# Patient Record
Sex: Female | Born: 1945
Health system: Southern US, Community
[De-identification: ages and names within clinical notes are randomized; demographics above are authoritative.]

## PROBLEM LIST (undated history)

## (undated) DIAGNOSIS — F329 Major depressive disorder, single episode, unspecified: Secondary | ICD-10-CM

## (undated) DIAGNOSIS — E039 Hypothyroidism, unspecified: Secondary | ICD-10-CM

## (undated) DIAGNOSIS — K219 Gastro-esophageal reflux disease without esophagitis: Secondary | ICD-10-CM

## (undated) DIAGNOSIS — I10 Essential (primary) hypertension: Secondary | ICD-10-CM

## (undated) DIAGNOSIS — E785 Hyperlipidemia, unspecified: Secondary | ICD-10-CM

## (undated) DIAGNOSIS — R195 Other fecal abnormalities: Secondary | ICD-10-CM

## (undated) DIAGNOSIS — F32A Depression, unspecified: Secondary | ICD-10-CM

## (undated) DIAGNOSIS — Z1231 Encounter for screening mammogram for malignant neoplasm of breast: Secondary | ICD-10-CM

## (undated) HISTORY — PX: FOOT SURGERY: SHX648

## (undated) HISTORY — DX: Other fecal abnormalities: R19.5

## (undated) HISTORY — DX: Gastro-esophageal reflux disease without esophagitis: K21.9

## (undated) HISTORY — DX: Major depressive disorder, single episode, unspecified: F32.9

## (undated) HISTORY — DX: Depression, unspecified: F32.A

## (undated) HISTORY — PX: TOTAL KNEE ARTHROPLASTY: SHX125

## (undated) HISTORY — DX: Hyperlipidemia, unspecified: E78.5

## (undated) HISTORY — DX: Hypothyroidism, unspecified: E03.9

## (undated) HISTORY — PX: ABDOMINAL HYSTERECTOMY: SHX81

## (undated) HISTORY — DX: Essential (primary) hypertension: I10

## (undated) HISTORY — DX: Encounter for screening mammogram for malignant neoplasm of breast: Z12.31

---

## 1997-08-17 ENCOUNTER — Ambulatory Visit (HOSPITAL_COMMUNITY): Admission: RE | Admit: 1997-08-17 | Discharge: 1997-08-17 | Payer: Self-pay | Admitting: Family Medicine

## 1997-10-28 ENCOUNTER — Emergency Department (HOSPITAL_COMMUNITY): Admission: EM | Admit: 1997-10-28 | Discharge: 1997-10-28 | Payer: Self-pay | Admitting: Emergency Medicine

## 1998-05-24 ENCOUNTER — Ambulatory Visit (HOSPITAL_COMMUNITY): Admission: RE | Admit: 1998-05-24 | Discharge: 1998-05-24 | Payer: Self-pay | Admitting: Family Medicine

## 1999-01-04 ENCOUNTER — Ambulatory Visit (HOSPITAL_COMMUNITY): Admission: RE | Admit: 1999-01-04 | Discharge: 1999-01-04 | Payer: Self-pay | Admitting: Oncology

## 1999-01-04 ENCOUNTER — Encounter: Payer: Self-pay | Admitting: Family Medicine

## 1999-01-22 ENCOUNTER — Other Ambulatory Visit: Admission: RE | Admit: 1999-01-22 | Discharge: 1999-01-22 | Payer: Self-pay | Admitting: Family Medicine

## 2000-01-10 ENCOUNTER — Encounter: Payer: Self-pay | Admitting: Family Medicine

## 2000-01-10 ENCOUNTER — Ambulatory Visit (HOSPITAL_COMMUNITY): Admission: RE | Admit: 2000-01-10 | Discharge: 2000-01-10 | Payer: Self-pay | Admitting: Family Medicine

## 2000-01-22 ENCOUNTER — Other Ambulatory Visit: Admission: RE | Admit: 2000-01-22 | Discharge: 2000-01-22 | Payer: Self-pay | Admitting: Family Medicine

## 2000-05-05 ENCOUNTER — Encounter: Payer: Self-pay | Admitting: Family Medicine

## 2000-05-05 ENCOUNTER — Encounter: Admission: RE | Admit: 2000-05-05 | Discharge: 2000-05-05 | Payer: Self-pay | Admitting: Family Medicine

## 2001-01-14 ENCOUNTER — Encounter: Payer: Self-pay | Admitting: Family Medicine

## 2001-01-14 ENCOUNTER — Ambulatory Visit (HOSPITAL_COMMUNITY): Admission: RE | Admit: 2001-01-14 | Discharge: 2001-01-14 | Payer: Self-pay | Admitting: Family Medicine

## 2001-01-22 ENCOUNTER — Other Ambulatory Visit: Admission: RE | Admit: 2001-01-22 | Discharge: 2001-01-22 | Payer: Self-pay | Admitting: Family Medicine

## 2002-01-18 ENCOUNTER — Encounter: Payer: Self-pay | Admitting: Family Medicine

## 2002-01-18 ENCOUNTER — Ambulatory Visit (HOSPITAL_COMMUNITY): Admission: RE | Admit: 2002-01-18 | Discharge: 2002-01-18 | Payer: Self-pay | Admitting: Family Medicine

## 2002-01-28 ENCOUNTER — Other Ambulatory Visit: Admission: RE | Admit: 2002-01-28 | Discharge: 2002-01-28 | Payer: Self-pay | Admitting: Family Medicine

## 2002-12-03 ENCOUNTER — Encounter: Admission: RE | Admit: 2002-12-03 | Discharge: 2002-12-03 | Payer: Self-pay | Admitting: Family Medicine

## 2002-12-03 ENCOUNTER — Encounter: Payer: Self-pay | Admitting: Family Medicine

## 2002-12-29 ENCOUNTER — Ambulatory Visit (HOSPITAL_BASED_OUTPATIENT_CLINIC_OR_DEPARTMENT_OTHER): Admission: RE | Admit: 2002-12-29 | Discharge: 2002-12-29 | Payer: Self-pay | Admitting: Orthopedic Surgery

## 2002-12-29 ENCOUNTER — Ambulatory Visit (HOSPITAL_COMMUNITY): Admission: RE | Admit: 2002-12-29 | Discharge: 2002-12-29 | Payer: Self-pay | Admitting: Orthopedic Surgery

## 2003-01-20 ENCOUNTER — Ambulatory Visit (HOSPITAL_COMMUNITY): Admission: RE | Admit: 2003-01-20 | Discharge: 2003-01-20 | Payer: Self-pay | Admitting: Family Medicine

## 2003-01-20 ENCOUNTER — Encounter: Payer: Self-pay | Admitting: Family Medicine

## 2003-02-03 ENCOUNTER — Other Ambulatory Visit: Admission: RE | Admit: 2003-02-03 | Discharge: 2003-02-03 | Payer: Self-pay | Admitting: Family Medicine

## 2004-01-23 ENCOUNTER — Ambulatory Visit (HOSPITAL_COMMUNITY): Admission: RE | Admit: 2004-01-23 | Discharge: 2004-01-23 | Payer: Self-pay | Admitting: Family Medicine

## 2005-02-14 ENCOUNTER — Ambulatory Visit (HOSPITAL_COMMUNITY): Admission: RE | Admit: 2005-02-14 | Discharge: 2005-02-14 | Payer: Self-pay | Admitting: Family Medicine

## 2005-02-21 ENCOUNTER — Encounter: Admission: RE | Admit: 2005-02-21 | Discharge: 2005-02-21 | Payer: Self-pay | Admitting: Family Medicine

## 2005-02-27 ENCOUNTER — Ambulatory Visit: Payer: Self-pay | Admitting: Internal Medicine

## 2005-03-05 ENCOUNTER — Encounter (INDEPENDENT_AMBULATORY_CARE_PROVIDER_SITE_OTHER): Payer: Self-pay | Admitting: Specialist

## 2005-03-05 ENCOUNTER — Ambulatory Visit: Payer: Self-pay | Admitting: Internal Medicine

## 2006-02-18 ENCOUNTER — Ambulatory Visit (HOSPITAL_COMMUNITY): Admission: RE | Admit: 2006-02-18 | Discharge: 2006-02-18 | Payer: Self-pay | Admitting: Family Medicine

## 2006-04-24 ENCOUNTER — Ambulatory Visit (HOSPITAL_COMMUNITY): Admission: RE | Admit: 2006-04-24 | Discharge: 2006-04-24 | Payer: Self-pay | Admitting: Family Medicine

## 2006-07-21 ENCOUNTER — Encounter: Admission: RE | Admit: 2006-07-21 | Discharge: 2006-07-21 | Payer: Self-pay | Admitting: Family Medicine

## 2007-02-24 ENCOUNTER — Ambulatory Visit (HOSPITAL_COMMUNITY): Admission: RE | Admit: 2007-02-24 | Discharge: 2007-02-24 | Payer: Self-pay | Admitting: Family Medicine

## 2007-04-09 HISTORY — PX: COLONOSCOPY: SHX174

## 2007-04-19 ENCOUNTER — Emergency Department (HOSPITAL_COMMUNITY): Admission: EM | Admit: 2007-04-19 | Discharge: 2007-04-19 | Payer: Self-pay | Admitting: Emergency Medicine

## 2008-02-25 ENCOUNTER — Ambulatory Visit (HOSPITAL_COMMUNITY): Admission: RE | Admit: 2008-02-25 | Discharge: 2008-02-25 | Payer: Self-pay | Admitting: Family Medicine

## 2008-08-16 DIAGNOSIS — F959 Tic disorder, unspecified: Secondary | ICD-10-CM | POA: Insufficient documentation

## 2008-10-20 DIAGNOSIS — K2100 Gastro-esophageal reflux disease with esophagitis, without bleeding: Secondary | ICD-10-CM | POA: Insufficient documentation

## 2009-02-28 ENCOUNTER — Ambulatory Visit (HOSPITAL_COMMUNITY): Admission: RE | Admit: 2009-02-28 | Discharge: 2009-02-28 | Payer: Self-pay | Admitting: Family Medicine

## 2009-04-10 DIAGNOSIS — N952 Postmenopausal atrophic vaginitis: Secondary | ICD-10-CM | POA: Insufficient documentation

## 2009-04-11 ENCOUNTER — Encounter: Admission: RE | Admit: 2009-04-11 | Discharge: 2009-04-11 | Payer: Self-pay | Admitting: Family Medicine

## 2009-05-25 DIAGNOSIS — J309 Allergic rhinitis, unspecified: Secondary | ICD-10-CM | POA: Insufficient documentation

## 2009-05-25 DIAGNOSIS — E538 Deficiency of other specified B group vitamins: Secondary | ICD-10-CM | POA: Insufficient documentation

## 2009-08-07 ENCOUNTER — Inpatient Hospital Stay (HOSPITAL_COMMUNITY): Admission: RE | Admit: 2009-08-07 | Discharge: 2009-08-11 | Payer: Self-pay | Admitting: Orthopedic Surgery

## 2010-01-22 DIAGNOSIS — M159 Polyosteoarthritis, unspecified: Secondary | ICD-10-CM | POA: Insufficient documentation

## 2010-03-05 ENCOUNTER — Encounter: Admission: RE | Admit: 2010-03-05 | Discharge: 2010-03-05 | Payer: Self-pay | Admitting: Family Medicine

## 2010-03-06 ENCOUNTER — Ambulatory Visit: Payer: Self-pay | Admitting: Family Medicine

## 2010-03-12 ENCOUNTER — Ambulatory Visit: Payer: Self-pay | Admitting: Family Medicine

## 2010-03-31 ENCOUNTER — Emergency Department (HOSPITAL_COMMUNITY)
Admission: EM | Admit: 2010-03-31 | Discharge: 2010-03-31 | Payer: Self-pay | Source: Home / Self Care | Admitting: Emergency Medicine

## 2010-04-06 ENCOUNTER — Ambulatory Visit
Admission: RE | Admit: 2010-04-06 | Discharge: 2010-04-06 | Payer: Self-pay | Source: Home / Self Care | Attending: Family Medicine | Admitting: Family Medicine

## 2010-04-06 ENCOUNTER — Ambulatory Visit: Admit: 2010-04-06 | Payer: Self-pay | Admitting: Family Medicine

## 2010-04-28 ENCOUNTER — Encounter: Payer: Self-pay | Admitting: Family Medicine

## 2010-05-15 ENCOUNTER — Ambulatory Visit (INDEPENDENT_AMBULATORY_CARE_PROVIDER_SITE_OTHER): Payer: 59 | Admitting: Family Medicine

## 2010-05-15 DIAGNOSIS — E039 Hypothyroidism, unspecified: Secondary | ICD-10-CM

## 2010-05-15 DIAGNOSIS — R1032 Left lower quadrant pain: Secondary | ICD-10-CM

## 2010-05-15 DIAGNOSIS — E785 Hyperlipidemia, unspecified: Secondary | ICD-10-CM

## 2010-05-15 DIAGNOSIS — Z79899 Other long term (current) drug therapy: Secondary | ICD-10-CM

## 2010-05-24 ENCOUNTER — Ambulatory Visit (INDEPENDENT_AMBULATORY_CARE_PROVIDER_SITE_OTHER): Payer: 59 | Admitting: Family Medicine

## 2010-05-24 DIAGNOSIS — Z79899 Other long term (current) drug therapy: Secondary | ICD-10-CM

## 2010-05-24 DIAGNOSIS — E785 Hyperlipidemia, unspecified: Secondary | ICD-10-CM

## 2010-06-22 ENCOUNTER — Emergency Department (HOSPITAL_COMMUNITY)
Admission: EM | Admit: 2010-06-22 | Discharge: 2010-06-22 | Disposition: A | Discharge status: Home / Self Care | Payer: 59 | Attending: Emergency Medicine | Admitting: Emergency Medicine

## 2010-06-22 DIAGNOSIS — R112 Nausea with vomiting, unspecified: Secondary | ICD-10-CM | POA: Insufficient documentation

## 2010-06-22 DIAGNOSIS — R197 Diarrhea, unspecified: Secondary | ICD-10-CM | POA: Insufficient documentation

## 2010-06-22 DIAGNOSIS — R5381 Other malaise: Secondary | ICD-10-CM | POA: Insufficient documentation

## 2010-06-22 LAB — POCT I-STAT, CHEM 8
Calcium, Ion: 1.12 mmol/L (ref 1.12–1.32)
Chloride: 106 mEq/L (ref 96–112)
Creatinine, Ser: 0.9 mg/dL (ref 0.4–1.2)
Glucose, Bld: 156 mg/dL — ABNORMAL HIGH (ref 70–99)
HCT: 44 % (ref 36.0–46.0)
Hemoglobin: 15 g/dL (ref 12.0–15.0)
Potassium: 4.1 mEq/L (ref 3.5–5.1)
Sodium: 139 mEq/L (ref 135–145)
TCO2: 20 mmol/L (ref 0–100)

## 2010-06-26 LAB — BLOOD GAS, ARTERIAL
Acid-base deficit: 0.1 mmol/L (ref 0.0–2.0)
Acid-base deficit: 1.9 mmol/L (ref 0.0–2.0)
Bicarbonate: 24.2 mEq/L — ABNORMAL HIGH (ref 20.0–24.0)
Drawn by: 324701
O2 Content: 3 L/min
O2 Content: 3 L/min
O2 Saturation: 97.3 %
Patient temperature: 98.6
Patient temperature: 98.6
TCO2: 25.4 mmol/L (ref 0–100)
pCO2 arterial: 40.2 mmHg (ref 35.0–45.0)
pCO2 arterial: 44.4 mmHg (ref 35.0–45.0)
pH, Arterial: 7.336 — ABNORMAL LOW (ref 7.350–7.400)
pH, Arterial: 7.397 (ref 7.350–7.400)
pO2, Arterial: 91 mmHg (ref 80.0–100.0)

## 2010-06-26 LAB — CBC
HCT: 26.3 % — ABNORMAL LOW (ref 36.0–46.0)
HCT: 28.4 % — ABNORMAL LOW (ref 36.0–46.0)
HCT: 35.4 % — ABNORMAL LOW (ref 36.0–46.0)
Hemoglobin: 9.6 g/dL — ABNORMAL LOW (ref 12.0–15.0)
Hemoglobin: 9.8 g/dL — ABNORMAL LOW (ref 12.0–15.0)
MCHC: 34.6 g/dL (ref 30.0–36.0)
MCHC: 34.9 g/dL (ref 30.0–36.0)
MCHC: 35.1 g/dL (ref 30.0–36.0)
MCV: 85.1 fL (ref 78.0–100.0)
MCV: 85.6 fL (ref 78.0–100.0)
Platelets: 109 10*3/uL — ABNORMAL LOW (ref 150–400)
Platelets: 116 10*3/uL — ABNORMAL LOW (ref 150–400)
Platelets: 138 10*3/uL — ABNORMAL LOW (ref 150–400)
Platelets: 138 10*3/uL — ABNORMAL LOW (ref 150–400)
Platelets: 199 10*3/uL (ref 150–400)
RBC: 3.31 MIL/uL — ABNORMAL LOW (ref 3.87–5.11)
RDW: 12.8 % (ref 11.5–15.5)
RDW: 12.9 % (ref 11.5–15.5)
RDW: 13.7 % (ref 11.5–15.5)
RDW: 13.8 % (ref 11.5–15.5)
RDW: 12.8 % (ref 11.5–15.5)
WBC: 2.9 10*3/uL — ABNORMAL LOW (ref 4.0–10.5)
WBC: 4.6 10*3/uL (ref 4.0–10.5)
WBC: 5 10*3/uL (ref 4.0–10.5)
WBC: 5 10*3/uL (ref 4.0–10.5)

## 2010-06-26 LAB — PHOSPHORUS
Phosphorus: 2.2 mg/dL — ABNORMAL LOW (ref 2.3–4.6)
Phosphorus: 3.6 mg/dL (ref 2.3–4.6)

## 2010-06-26 LAB — BASIC METABOLIC PANEL
BUN: 11 mg/dL (ref 6–23)
BUN: 12 mg/dL (ref 6–23)
BUN: 6 mg/dL (ref 6–23)
BUN: 7 mg/dL (ref 6–23)
CO2: 25 mEq/L (ref 19–32)
CO2: 25 mEq/L (ref 19–32)
Calcium: 7.9 mg/dL — ABNORMAL LOW (ref 8.4–10.5)
Calcium: 8.1 mg/dL — ABNORMAL LOW (ref 8.4–10.5)
Chloride: 101 mEq/L (ref 96–112)
Chloride: 114 mEq/L — ABNORMAL HIGH (ref 96–112)
Creatinine, Ser: 0.61 mg/dL (ref 0.4–1.2)
Creatinine, Ser: 0.61 mg/dL (ref 0.4–1.2)
Creatinine, Ser: 0.66 mg/dL (ref 0.4–1.2)
Creatinine, Ser: 0.71 mg/dL (ref 0.4–1.2)
GFR calc Af Amer: >60 mL/min (ref >60)
GFR calc non Af Amer: >60 mL/min (ref >60)
GFR calc non Af Amer: >60 mL/min (ref >60)
GFR calc non Af Amer: >60 mL/min (ref >60)
Glucose, Bld: 115 mg/dL — ABNORMAL HIGH (ref 70–99)
Glucose, Bld: 119 mg/dL — ABNORMAL HIGH (ref 70–99)
Glucose, Bld: 122 mg/dL — ABNORMAL HIGH (ref 70–99)
Glucose, Bld: 131 mg/dL — ABNORMAL HIGH (ref 70–99)
Potassium: 3.5 mEq/L (ref 3.5–5.1)
Sodium: 133 mEq/L — ABNORMAL LOW (ref 135–145)

## 2010-06-26 LAB — CULTURE, BLOOD (ROUTINE X 2)
Culture: NO GROWTH
Culture: NO GROWTH

## 2010-06-26 LAB — URINALYSIS, ROUTINE W REFLEX MICROSCOPIC
Glucose, UA: NEGATIVE mg/dL
Nitrite: NEGATIVE
Specific Gravity, Urine: 1.007 (ref 1.005–1.030)
pH: 6 (ref 5.0–8.0)

## 2010-06-26 LAB — CULTURE, RESPIRATORY W GRAM STAIN: Culture: NORMAL

## 2010-06-26 LAB — CROSSMATCH: Antibody Screen: NEGATIVE

## 2010-06-26 LAB — DIFFERENTIAL
Basophils Absolute: 0 10*3/uL (ref 0.0–0.1)
Basophils Relative: 0 % (ref 0–1)
Eosinophils Absolute: 0.1 10*3/uL (ref 0.0–0.7)
Eosinophils Relative: 5 % (ref 0–5)
Eosinophils Relative: 3 % (ref 0–5)
Lymphocytes Relative: 27 % (ref 12–46)
Lymphs Abs: 0.3 10*3/uL — ABNORMAL LOW (ref 0.7–4.0)
Monocytes Absolute: 0.5 10*3/uL (ref 0.1–1.0)
Monocytes Relative: 9 % (ref 3–12)
Neutro Abs: 3.1 10*3/uL (ref 1.7–7.7)
Neutrophils Relative %: 75 % (ref 43–77)

## 2010-06-26 LAB — EXPECTORATED SPUTUM ASSESSMENT W GRAM STAIN, RFLX TO RESP C

## 2010-06-26 LAB — COMPREHENSIVE METABOLIC PANEL
AST: 18 U/L (ref 0–37)
Albumin: 4.3 g/dL (ref 3.5–5.2)
BUN: 15 mg/dL (ref 6–23)
Chloride: 104 mEq/L (ref 96–112)
Creatinine, Ser: 0.59 mg/dL (ref 0.4–1.2)
GFR calc Af Amer: >60 mL/min (ref >60)
Potassium: 3.8 mEq/L (ref 3.5–5.1)
Total Protein: 6.6 g/dL (ref 6.0–8.3)

## 2010-06-26 LAB — URINE CULTURE: Colony Count: NO GROWTH

## 2010-06-26 LAB — MAGNESIUM: Magnesium: 2.5 mg/dL (ref 1.5–2.5)

## 2010-06-26 LAB — MRSA PCR SCREENING: MRSA by PCR: NEGATIVE

## 2010-06-26 LAB — D-DIMER, QUANTITATIVE: D-Dimer, Quant: 3.01 ug/mL-FEU — ABNORMAL HIGH (ref 0.00–0.48)

## 2010-06-26 LAB — BRAIN NATRIURETIC PEPTIDE: Pro B Natriuretic peptide (BNP): 136 pg/mL — ABNORMAL HIGH (ref 0.0–100.0)

## 2010-06-26 LAB — APTT: aPTT: 29 seconds (ref 24–37)

## 2010-07-25 ENCOUNTER — Encounter: Payer: Self-pay | Admitting: Family Medicine

## 2010-08-22 ENCOUNTER — Encounter: Payer: Self-pay | Admitting: Family Medicine

## 2010-08-22 ENCOUNTER — Ambulatory Visit (INDEPENDENT_AMBULATORY_CARE_PROVIDER_SITE_OTHER): Payer: 59 | Admitting: Family Medicine

## 2010-08-22 VITALS — BP 116/76 | HR 64 | Wt 120.0 lb

## 2010-08-22 DIAGNOSIS — J3089 Other allergic rhinitis: Secondary | ICD-10-CM

## 2010-08-22 DIAGNOSIS — E785 Hyperlipidemia, unspecified: Secondary | ICD-10-CM

## 2010-08-22 LAB — LIPID PANEL
Cholesterol: 277 mg/dL — ABNORMAL HIGH (ref 0–200)
VLDL: 52 mg/dL — ABNORMAL HIGH (ref 0–40)

## 2010-08-22 MED ORDER — MONTELUKAST SODIUM 10 MG PO TABS: 10.0000 mg | ORAL_TABLET | Freq: Every day | ORAL | Status: DC

## 2010-08-22 MED ORDER — FLUTICASONE PROPIONATE 50 MCG/ACT NA SUSP: 2.0000 | Freq: Every day | NASAL | Status: DC

## 2010-08-22 NOTE — Progress Notes (Signed)
  Subjective:    Patient ID: Bonnie Sanders, female    DOB: [birth year], 65 y.o.   MRN: [id number]  HPI she is here for a recheck on her lipids. She has been using omega-3 and having no difficulty with this. Also has underlying allergies and needs her Singulair and Flonase renewed. These medicines seem to be working quite nicely for her.   Review of Systems    negative except as above Objective:   Physical Exam alert and in no distress otherwise not examined        Assessment & Plan:  Is lipidemia. Allergic rhinitis. Lipid panel will be drawn. I will also renew her Flonase and Singulair. Return here as needed.

## 2010-08-23 ENCOUNTER — Telehealth: Payer: Self-pay | Admitting: Family Medicine

## 2010-08-24 ENCOUNTER — Telehealth: Payer: Self-pay

## 2010-08-24 NOTE — Progress Notes (Signed)
Waiting for pt call me back

## 2010-08-24 NOTE — Telephone Encounter (Signed)
Pt has been informed of labs and will come by Monday to pick up card

## 2010-08-24 NOTE — Op Note (Signed)
NAMEKORRINA, ZERN                         ACCOUNT NO.:  [id number]   MEDICAL RECORD NO.:  [id number]                   PATIENT TYPE:  AMB   LOCATION:  DSC                                  FACILITY:  [facility]   PHYSICIAN:  Mila Homer. Sherlean Foot, M.D.              DATE OF BIRTH:  11/30/1945   DATE OF PROCEDURE:  12/29/2002  DATE OF DISCHARGE:                                 OPERATIVE REPORT   PREOPERATIVE DIAGNOSIS:  Right knee lateral meniscal tear, possible medial  meniscal tear and osteoarthritis.   POSTOPERATIVE DIAGNOSIS:  Right knee medial meniscal tear, lateral meniscal  tear, osteoarthritis and loose body.   SURGEON:  Mila Homer. Sherlean Foot, M.D.   ASSISTANT:  None.   ANESTHESIA:  General.   INDICATIONS FOR PROCEDURE:  The patient is a 65 year old white female with  MRI evidence of anterior horn lateral meniscal tear, questionable  degenerative changes in the medial meniscus and mechanical symptoms.  Informed consent was obtained.   DESCRIPTION OF PROCEDURE:  The patient was taken to the operating room,  administered general anesthesia in the supine position on the operating room  table.  The right lower extremity was prepped and draped in the usual  sterile fashion.  Inferolateral and inferomedial portals were created with a  #11 blade, blunt trocar and cannula.  Diagnostic arthroscopy revealed some  mild degree of patellofemoral osteoarthritis.  However, the lateral  compartment had some areas of grade 4 changes mainly near the notch.  The  ACL was completely gone and torn and the anterior portion was impinging  somewhat in the lateral compartment. There was also a loose body in the  notch behind the torn ACL sitting on top of the posterior horn of the  lateral meniscus, measuring 1 x 1 cm when we pulled it out.  I performed a  chondroplasty on the lateral compartment.  I then debrided the lateral  meniscus with basket forceps and a great white shaver.  I then used the  pituitary rongeur to remove the loose body and the notch.  I debrided the  ACL back to its origins.  I then went into the medial compartment. There was  a medial meniscal tear which was complex with several radial type tears in  the posterior horn.  I performed an aggressive posterior horn medial  meniscectomy with basket forceps and shaver.  There was an area 1 x 1 cm on  the posterior tibial plateau with grade 4 chondromalacia.  I debrided this  area as well.  I then went into extension, checked all around the gutters,  found no more loose bodies.  I then evacuated the joint of instruments and  fluid.  I then closed with interrupted 4-0 nylon sutures, dressed with  Xeroform, dressing sponges, sterile Webril and Ace wrap.  I did infiltrate  10mL of a Marcaine morphine mixture in both portals.   TOURNIQUET TIME:  None.    COMPLICATIONS:  None.   DRAINS:  None.                                               Mila Homer. Sherlean Foot, M.D.    SDL/MEDQ  D:  12/29/2002  T:  12/29/2002  Job:  [id number]

## 2010-08-24 NOTE — Telephone Encounter (Signed)
Left message for pt to call me back 

## 2010-08-29 NOTE — Telephone Encounter (Signed)
DONE-LM  

## 2010-09-12 ENCOUNTER — Telehealth: Payer: Self-pay | Admitting: Family Medicine

## 2010-09-12 MED ORDER — LEVOTHYROXINE SODIUM 112 MCG PO TABS: 112.0000 ug | ORAL_TABLET | Freq: Every day | ORAL | Status: DC

## 2010-09-12 NOTE — Telephone Encounter (Signed)
Ordered med.

## 2010-09-17 ENCOUNTER — Other Ambulatory Visit: Payer: Self-pay

## 2010-09-17 MED ORDER — LEVOTHYROXINE SODIUM 100 MCG PO TABS: 100.0000 ug | ORAL_TABLET | Freq: Every day | ORAL | Status: DC

## 2010-10-25 ENCOUNTER — Encounter: Payer: Self-pay | Admitting: Family Medicine

## 2010-10-25 ENCOUNTER — Ambulatory Visit (INDEPENDENT_AMBULATORY_CARE_PROVIDER_SITE_OTHER): Payer: 59 | Admitting: Family Medicine

## 2010-10-25 VITALS — BP 110/70 | HR 84 | Wt 121.0 lb

## 2010-10-25 DIAGNOSIS — E785 Hyperlipidemia, unspecified: Secondary | ICD-10-CM | POA: Insufficient documentation

## 2010-10-25 DIAGNOSIS — R201 Hypoesthesia of skin: Secondary | ICD-10-CM

## 2010-10-25 DIAGNOSIS — Z79899 Other long term (current) drug therapy: Secondary | ICD-10-CM

## 2010-10-25 DIAGNOSIS — E039 Hypothyroidism, unspecified: Secondary | ICD-10-CM | POA: Insufficient documentation

## 2010-10-25 DIAGNOSIS — R209 Unspecified disturbances of skin sensation: Secondary | ICD-10-CM

## 2010-10-25 LAB — HEPATIC FUNCTION PANEL
ALT: 17 U/L (ref 0–35)
Albumin: 4.5 g/dL (ref 3.5–5.2)
Total Protein: 6.9 g/dL (ref 6.0–8.3)

## 2010-10-25 LAB — LIPID PANEL
HDL: 39 mg/dL — ABNORMAL LOW (ref >39)
Triglycerides: 282 mg/dL — ABNORMAL HIGH (ref <150)

## 2010-10-25 LAB — TSH: TSH: 0.051 u[IU]/mL — ABNORMAL LOW (ref 0.350–4.500)

## 2010-10-25 NOTE — Patient Instructions (Signed)
Monitor the difficulty with your hands and if this gets worse, I will refer to neurology.

## 2010-10-25 NOTE — Progress Notes (Signed)
  Subjective:    Patient ID: Bonnie Sanders, female    DOB: 09/24/1945, 65 y.o.   MRN: [id number]  HPI She is here for recheck on her lipids. She has been taking CoQ10 and continued on Lipitor and her aches and pains She has also had intermittent difficulty with numbness in her fingertips. This has sometimes interfered with her ability to do fine motor activity. She also has a long history of tremor that she can date back to her childhood. This is especially bothersome when she is stressed. She also is presently on 0.1 of her Synthroid.   Review of Systems     Objective:   Physical Exam Alert and in no distress. Involuntary movements are noted. Good hand strength. Pulses normal. Sensation normal.       Assessment & Plan:  Hyperlipidemia. Hypothyroid. hypesthesias of the hands.akesthesias Liver, lipid and TSH.

## 2010-10-29 ENCOUNTER — Telehealth: Payer: Self-pay

## 2010-10-29 ENCOUNTER — Other Ambulatory Visit: Payer: Self-pay

## 2010-10-29 MED ORDER — ATORVASTATIN CALCIUM 20 MG PO TABS: 20.0000 mg | ORAL_TABLET | Freq: Every day | ORAL | Status: DC

## 2010-10-29 NOTE — Telephone Encounter (Signed)
Pt med done

## 2010-10-29 NOTE — Telephone Encounter (Signed)
Called pt to let her know about labs and to repeat in one month

## 2010-10-29 NOTE — Telephone Encounter (Signed)
Called pt home # left messaqge

## 2010-11-26 ENCOUNTER — Ambulatory Visit: Payer: 59 | Admitting: Family Medicine

## 2010-11-30 ENCOUNTER — Encounter: Payer: 59 | Admitting: Family Medicine

## 2010-11-30 ENCOUNTER — Ambulatory Visit (INDEPENDENT_AMBULATORY_CARE_PROVIDER_SITE_OTHER): Payer: 59 | Admitting: Family Medicine

## 2010-11-30 ENCOUNTER — Encounter: Payer: Self-pay | Admitting: Family Medicine

## 2010-11-30 VITALS — BP 120/60 | HR 80 | Ht 61.0 in | Wt 123.0 lb

## 2010-11-30 DIAGNOSIS — J301 Allergic rhinitis due to pollen: Secondary | ICD-10-CM

## 2010-11-30 DIAGNOSIS — Z23 Encounter for immunization: Secondary | ICD-10-CM

## 2010-11-30 DIAGNOSIS — E785 Hyperlipidemia, unspecified: Secondary | ICD-10-CM

## 2010-11-30 DIAGNOSIS — I10 Essential (primary) hypertension: Secondary | ICD-10-CM

## 2010-11-30 DIAGNOSIS — Z0000 Encounter for general adult medical examination without abnormal findings: Secondary | ICD-10-CM

## 2010-11-30 DIAGNOSIS — M810 Age-related osteoporosis without current pathological fracture: Secondary | ICD-10-CM

## 2010-11-30 DIAGNOSIS — Z79899 Other long term (current) drug therapy: Secondary | ICD-10-CM

## 2010-11-30 DIAGNOSIS — N941 Unspecified dyspareunia: Secondary | ICD-10-CM

## 2010-11-30 DIAGNOSIS — E039 Hypothyroidism, unspecified: Secondary | ICD-10-CM

## 2010-11-30 DIAGNOSIS — K219 Gastro-esophageal reflux disease without esophagitis: Secondary | ICD-10-CM

## 2010-11-30 DIAGNOSIS — IMO0002 Reserved for concepts with insufficient information to code with codable children: Secondary | ICD-10-CM

## 2010-11-30 HISTORY — DX: Allergic rhinitis due to pollen: J30.1

## 2010-11-30 LAB — TSH: TSH: 0.045 u[IU]/mL — ABNORMAL LOW (ref 0.350–4.500)

## 2010-11-30 LAB — COMPREHENSIVE METABOLIC PANEL
ALT: 21 U/L (ref 0–35)
AST: 20 U/L (ref 0–37)
Albumin: 4.8 g/dL (ref 3.5–5.2)
Alkaline Phosphatase: 62 U/L (ref 39–117)
BUN: 16 mg/dL (ref 6–23)
CO2: 27 mEq/L (ref 19–32)
Calcium: 10 mg/dL (ref 8.4–10.5)
Chloride: 101 mEq/L (ref 96–112)
Creat: 0.74 mg/dL (ref 0.50–1.10)
Glucose, Bld: 86 mg/dL (ref 70–99)
Potassium: 4.7 mEq/L (ref 3.5–5.3)
Sodium: 140 mEq/L (ref 135–145)
Total Bilirubin: 0.5 mg/dL (ref 0.3–1.2)
Total Protein: 7.4 g/dL (ref 6.0–8.3)

## 2010-11-30 LAB — CBC WITH DIFFERENTIAL/PLATELET
Basophils Absolute: 0 10*3/uL (ref 0.0–0.1)
Basophils Relative: 1 % (ref 0–1)
Eosinophils Absolute: 0.1 10*3/uL (ref 0.0–0.7)
Eosinophils Relative: 2 % (ref 0–5)
HCT: 37.8 % (ref 36.0–46.0)
Hemoglobin: 12.5 g/dL (ref 12.0–15.0)
Lymphocytes Relative: 26 % (ref 12–46)
Lymphs Abs: 1.5 10*3/uL (ref 0.7–4.0)
MCH: 30 pg (ref 26.0–34.0)
MCHC: 33.1 g/dL (ref 30.0–36.0)
MCV: 90.6 fL (ref 78.0–100.0)
Monocytes Absolute: 0.3 10*3/uL (ref 0.1–1.0)
Monocytes Relative: 6 % (ref 3–12)
Neutro Abs: 3.7 10*3/uL (ref 1.7–7.7)
Neutrophils Relative %: 66 % (ref 43–77)
Platelets: 182 10*3/uL (ref 150–400)
RBC: 4.17 MIL/uL (ref 3.87–5.11)
RDW: 13 % (ref 11.5–15.5)
WBC: 5.7 10*3/uL (ref 4.0–10.5)

## 2010-11-30 LAB — LIPID PANEL
LDL Cholesterol: 96 mg/dL (ref 0–99)
Total CHOL/HDL Ratio: 5 Ratio
VLDL: 63 mg/dL — ABNORMAL HIGH (ref 0–40)

## 2010-11-30 LAB — POCT URINALYSIS DIPSTICK
Bilirubin, UA: NEGATIVE
Blood, UA: NEGATIVE
Glucose, UA: NEGATIVE
Nitrite, UA: NEGATIVE
Spec Grav, UA: 1.015

## 2010-11-30 NOTE — Progress Notes (Signed)
  Subjective:    Patient ID: Bonnie Sanders, female    DOB: 11/23/1945, 65 y.o.   MRN: [id number]  HPI She is here for a complete examination. She does have a right cataract. She does have allergies and uses Claritin for this. She has a history of osteoporosis and continues on her Fosamax. She was placed on amitriptyline several years ago and had been seen by Dr. Jodi Marble. She has never tried to come off this medication. She has had difficulty recently with pain with sexual intercourse. Her reflux symptoms are under good control. She continues on her blood pressure medications. She also continues on thyroid medicine.  Review of Systems Negative except as above    Objective:   Physical Exam BP 120/60  Pulse 80  Ht 5\' 1"  (1.549 m)  Wt 123 lb (55.792 kg)  BMI 23.24 kg/m2  General Appearance:    Alert, cooperative, no distress, appears stated age  Head:    Normocephalic, without obvious abnormality, atraumatic  Eyes:    PERRL, conjunctiva/corneas clear, EOM's intact, fundi    benign  Ears:    Normal TM's and external ear canals  Nose:   Nares normal, mucosa normal, no drainage or sinus   tenderness  Throat:   Lips, mucosa, and tongue normal; teeth and gums normal  Neck:   Supple, no lymphadenopathy;  thyroid:  no   enlargement/tenderness/nodules; no carotid   bruit or JVD  Back:    Spine nontender, no curvature, ROM normal, no CVA     tenderness  Lungs:     Clear to auscultation bilaterally without wheezes, rales or     ronchi; respirations unlabored  Chest Wall:    No tenderness or deformity   Heart:    Regular rate and rhythm, S1 and S2 normal, no murmur, rub   or gallop  Breast Exam:    Deferred to GYN  Abdomen:     Soft, non-tender, nondistended, normoactive bowel sounds,    no masses, no hepatosplenomegaly  Genitalia:    Deferred to GYN     Extremities:   No clubbing, cyanosis or edema  Pulses:   2+ and symmetric all extremities  Skin:   Skin color, texture, turgor normal, no  rashes or lesions  Lymph nodes:   Cervical, supraclavicular, and axillary nodes normal  Neurologic:   CNII-XII intact, normal strength, sensation and gait; reflexes 2+ and symmetric throughout          Psych:   Normal mood, affect, hygiene and grooming.           Assessment & Plan:  Allergic rhinitis. Hypothyroid. Depression. Hypertension. GERD. Osteoporosis. Hyperlipidemia. Dyspareunia. I will refer to GYN for further evaluation of her dyspareunia. Discussed stopping the amitriptyline and she will consider this. She will continue on her other medications. Routine blood screening. DEXA scan. Flu shot.

## 2010-11-30 NOTE — Patient Instructions (Signed)
Continue on your present medications but consider slowly tapering off of the amitriptyline.

## 2010-12-03 ENCOUNTER — Telehealth: Payer: Self-pay

## 2010-12-03 DIAGNOSIS — Z23 Encounter for immunization: Secondary | ICD-10-CM

## 2010-12-03 NOTE — Telephone Encounter (Signed)
Pt informed of labs and to recheck in 2 to 3 mth

## 2010-12-11 ENCOUNTER — Telehealth: Payer: Self-pay

## 2010-12-11 NOTE — Telephone Encounter (Signed)
Talked to pt husband about dexa results

## 2010-12-14 ENCOUNTER — Other Ambulatory Visit: Payer: Self-pay

## 2011-01-02 ENCOUNTER — Other Ambulatory Visit: Payer: Self-pay | Admitting: Family Medicine

## 2011-02-06 ENCOUNTER — Telehealth: Payer: Self-pay | Admitting: Family Medicine

## 2011-02-06 NOTE — Telephone Encounter (Signed)
I spoke with the patient that states she has enough medication. She was only trying to change her pharmacy, but she did make an appt. To see Dr. Susann Givens on 02/13/11@ 1130am. CLS

## 2011-02-06 NOTE — Telephone Encounter (Signed)
She needs an appointment to check her thyroid function again. Don't let her run out of the medicine

## 2011-02-13 ENCOUNTER — Encounter: Payer: Self-pay | Admitting: Family Medicine

## 2011-02-13 ENCOUNTER — Ambulatory Visit (INDEPENDENT_AMBULATORY_CARE_PROVIDER_SITE_OTHER): Payer: Medicare Other | Admitting: Family Medicine

## 2011-02-13 VITALS — BP 124/78 | HR 60 | Wt 124.0 lb

## 2011-02-13 DIAGNOSIS — E785 Hyperlipidemia, unspecified: Secondary | ICD-10-CM

## 2011-02-13 DIAGNOSIS — Z79899 Other long term (current) drug therapy: Secondary | ICD-10-CM

## 2011-02-13 DIAGNOSIS — E039 Hypothyroidism, unspecified: Secondary | ICD-10-CM

## 2011-02-13 LAB — TSH: TSH: 0.168 u[IU]/mL — ABNORMAL LOW (ref 0.350–4.500)

## 2011-02-13 NOTE — Progress Notes (Signed)
  Subjective:    Patient ID: Bonnie Sanders, female    DOB: Sep 17, 1945, 65 y.o.   MRN: [id number]  HPI She is here for recheck. She continues on her present thyroid dosing. Previous TSH was quite low. She is having no skin changes, hair changes, tachycardia. She did stop taking her Lipitor stating difficulty with myalgias. She tried starting at a lower dose but still had difficulty with this.   Review of Systems     Objective:   Physical Exam Alert and in no distress otherwise not examined      Assessment & Plan:   1. Hypothyroid  TSH  2. Hyperlipidemia LDL goal < 100    3. Encounter for long-term (current) use of other medications  TSH   a sample of Crestor was given. She is to let me know how this works. TSH drawn and will readjust her thyroid based on this

## 2011-02-13 NOTE — Patient Instructions (Signed)
Try the Crestor and let me know if it causes aches and pains. If you have no difficulty, I will call this in.

## 2011-02-14 ENCOUNTER — Other Ambulatory Visit: Payer: Self-pay | Admitting: Internal Medicine

## 2011-02-14 MED ORDER — LEVOTHYROXINE SODIUM 88 MCG PO TABS: 88.0000 ug | ORAL_TABLET | Freq: Every day | ORAL | Status: DC

## 2011-03-04 ENCOUNTER — Encounter: Payer: Self-pay | Admitting: Medical

## 2011-03-04 ENCOUNTER — Ambulatory Visit (INDEPENDENT_AMBULATORY_CARE_PROVIDER_SITE_OTHER): Payer: Medicare Other | Admitting: Medical

## 2011-03-04 VITALS — BP 120/70 | HR 64 | Temp 99.2°F | Resp 16 | Wt 126.0 lb

## 2011-03-04 DIAGNOSIS — J329 Chronic sinusitis, unspecified: Secondary | ICD-10-CM | POA: Insufficient documentation

## 2011-03-04 DIAGNOSIS — J40 Bronchitis, not specified as acute or chronic: Secondary | ICD-10-CM | POA: Insufficient documentation

## 2011-03-04 MED ORDER — AZITHROMYCIN 250 MG PO TABS: ORAL_TABLET | ORAL | Status: AC

## 2011-03-04 NOTE — Progress Notes (Signed)
Subjective:   HPI  Bonnie Sanders is a 65 y.o. female who presents with 7-8 day hx/o cough, head and chest congestion, headache, productive cough, fever, and was around grandson recently who has been sick.  Using OTC decongestant.  Hasn't been sick in a while.  She does report hx/o sinus infection and notes in the past these seem to progress to chest involvement.  No other aggravating or relieving factors.    No other c/o.  The following portions of the patient's history were reviewed and updated as appropriate: allergies, current medications, past family history, past medical history, past social history, past surgical history and problem list.   Past Medical History  Diagnosis Date  . Allergic rhinitis   . Hypothyroid   . Depression   . Hypertension   . GERD (gastroesophageal reflux disease)   . Osteoporosis   . Dyslipidemia     Review of Systems Constitutional: +fever, -chills, -sweats, -unexpected -weight change,-fatigue ENT: +runny nose, -ear pain, =sore throat Cardiology:  -chest pain, -palpitations, -edema Respiratory: +cough, -shortness of breath, -wheezing Gastroenterology: -abdominal pain, -nausea, -vomiting, -diarrhea, -constipation Hematology: -bleeding or bruising problems Musculoskeletal: -arthralgias, -myalgias, -joint swelling, -back pain Ophthalmology: -vision changes Urology: -dysuria, -difficulty urinating, -hematuria, -urinary frequency, -urgency Neurology: +headache, -weakness, -tingling, -numbness    Objective:   Filed Vitals:   03/04/11 1119  BP: 120/70  Pulse: 64  Temp: 99.2 F (37.3 C)  Resp: 16    General appearance: Alert, WD/WN, no distress                             Skin: warm, no rash                           Head: +frontal sinus tenderness,                            Eyes: conjunctiva normal, corneas clear, PERRLA                            Ears: pearly TMs, external ear canals normal                          Nose: septum midline,  turbinates swollen, with erythema and clear discharge             Mouth/throat: MMM, tongue normal, mild pharyngeal erythema                           Neck: supple, no adenopathy, no thyromegaly, nontender                          Heart: RRR, normal S1, S2, no murmurs                         Lungs: CTA bilaterally, no wheezes, rales, or rhonchi      Assessment and Plan:   Encounter Diagnoses  Name Primary?  . Sinusitis Yes  . Bronchitis     Prescription given for Zpak.  Can use OTC Mucinex DM or Coricidin HBP for congestion.   Avoid the OTC decongestant she brought in given possibility of raising her BP.  Can use Tylenol or Ibuprofen OTC for  fever and malaise.  Discussed symptomatic relief, nasal saline, and call or return if worse or not improving in 2-3 days.

## 2011-03-04 NOTE — Patient Instructions (Signed)
In the future, use either Mucinex DM or Coricidin HBP for cough and congestion.  These would be safer choices given your history of high blood pressure.   Sinusitis Sinuses are air pockets within the bones of your face. The growth of bacteria within a sinus leads to infection. The infection prevents the sinuses from draining. This infection is called sinusitis. SYMPTOMS  There will be different areas of pain depending on which sinuses have become infected.  The maxillary sinuses often produce pain beneath the eyes.   Frontal sinusitis may cause pain in the middle of the forehead and above the eyes.  Other problems (symptoms) include:  Toothaches.   Colored, pus-like (purulent) drainage from the nose.   Swelling, warmth, and tenderness over the sinus areas may be signs of infection.  TREATMENT  Sinusitis is most often determined by an exam.X-rays may be taken. If x-rays have been taken, make sure you obtain your results or find out how you are to obtain them. Your caregiver may give you medications (antibiotics). These are medications that will help kill the bacteria causing the infection. You may also be given a medication (decongestant) that helps to reduce sinus swelling.  HOME CARE INSTRUCTIONS   Only take over-the-counter or prescription medicines for pain, discomfort, or fever as directed by your caregiver.   Drink extra fluids. Fluids help thin the mucus so your sinuses can drain more easily.   Applying either moist heat or ice packs to the sinus areas may help relieve discomfort.   Use saline nasal sprays to help moisten your sinuses. The sprays can be found at your local drugstore.  SEEK IMMEDIATE MEDICAL CARE IF:  You have a fever.   You have increasing pain, severe headaches, or toothache.   You have nausea, vomiting, or drowsiness.   You develop unusual swelling around the face or trouble seeing.  MAKE SURE YOU:   Understand these instructions.   Will watch your  condition.   Will get help right away if you are not doing well or get worse.  Document Released: 03/25/2005 Document Revised: 12/05/2010 Document Reviewed: 10/22/2006 [facility] Patient Information 2012 [place], [place].    Bronchitis Bronchitis is the body's way of reacting to injury and/or infection (inflammation) of the bronchi. Bronchi are the air tubes that extend from the windpipe into the lungs. If the inflammation becomes severe, it may cause shortness of breath. CAUSES  Inflammation may be caused by:  A virus.   Germs (bacteria).   Dust.   Allergens.   Pollutants and many other irritants.  The cells lining the bronchial tree are covered with tiny hairs (cilia). These constantly beat upward, away from the lungs, toward the mouth. This keeps the lungs free of pollutants. When these cells become too irritated and are unable to do their job, mucus begins to develop. This causes the characteristic cough of bronchitis. The cough clears the lungs when the cilia are unable to do their job. Without either of these protective mechanisms, the mucus would settle in the lungs. Then you would develop pneumonia. Smoking is a common cause of bronchitis and can contribute to pneumonia. Stopping this habit is the single most important thing you can do to help yourself. TREATMENT   Your caregiver may prescribe an antibiotic if the cough is caused by bacteria. Also, medicines that open up your airways make it easier to breathe. Your caregiver may also recommend or prescribe an expectorant. It will loosen the mucus to be coughed up. Only take  over-the-counter or prescription medicines for pain, discomfort, or fever as directed by your caregiver.   Removing whatever causes the problem (smoking, for example) is critical to preventing the problem from getting worse.   Cough suppressants may be prescribed for relief of cough symptoms.   Inhaled medicines may be prescribed to help with symptoms now and  to help prevent problems from returning.   For those with recurrent (chronic) bronchitis, there may be a need for steroid medicines.  SEEK IMMEDIATE MEDICAL CARE IF:   During treatment, you develop more pus-like mucus (purulent sputum).   You have a fever.   Your baby is older than 3 months with a rectal temperature of 102 F (38.9 C) or higher.   Your baby is 49 months old or younger with a rectal temperature of 100.4 F (38 C) or higher.   You become progressively more ill.   You have increased difficulty breathing, wheezing, or shortness of breath.  It is necessary to seek immediate medical care if you are elderly or sick from any other disease. MAKE SURE YOU:   Understand these instructions.   Will watch your condition.   Will get help right away if you are not doing well or get worse.  Document Released: 03/25/2005 Document Revised: 12/05/2010 Document Reviewed: 02/02/2008 [facility] Patient Information 2012 [place], [place].

## 2011-04-29 ENCOUNTER — Encounter: Payer: Self-pay | Admitting: Family Medicine

## 2011-04-29 ENCOUNTER — Ambulatory Visit (INDEPENDENT_AMBULATORY_CARE_PROVIDER_SITE_OTHER): Payer: Medicare Other | Admitting: Family Medicine

## 2011-04-29 DIAGNOSIS — I10 Essential (primary) hypertension: Secondary | ICD-10-CM

## 2011-04-29 DIAGNOSIS — Z79899 Other long term (current) drug therapy: Secondary | ICD-10-CM

## 2011-04-29 DIAGNOSIS — E039 Hypothyroidism, unspecified: Secondary | ICD-10-CM

## 2011-04-29 DIAGNOSIS — E785 Hyperlipidemia, unspecified: Secondary | ICD-10-CM | POA: Diagnosis not present

## 2011-04-29 DIAGNOSIS — K219 Gastro-esophageal reflux disease without esophagitis: Secondary | ICD-10-CM | POA: Diagnosis not present

## 2011-04-29 DIAGNOSIS — J301 Allergic rhinitis due to pollen: Secondary | ICD-10-CM

## 2011-04-29 DIAGNOSIS — M810 Age-related osteoporosis without current pathological fracture: Secondary | ICD-10-CM

## 2011-04-29 LAB — TSH: TSH: 9.904 u[IU]/mL — ABNORMAL HIGH (ref 0.350–4.500)

## 2011-04-29 MED ORDER — ALENDRONATE SODIUM 70 MG PO TABS: 70.0000 mg | ORAL_TABLET | ORAL | Status: DC

## 2011-04-29 MED ORDER — AMITRIPTYLINE HCL 150 MG PO TABS: 150.0000 mg | ORAL_TABLET | Freq: Every day | ORAL | Status: DC

## 2011-04-29 MED ORDER — LISINOPRIL 5 MG PO TABS: 5.0000 mg | ORAL_TABLET | Freq: Every day | ORAL | Status: DC

## 2011-04-29 MED ORDER — MONTELUKAST SODIUM 10 MG PO TABS: 10.0000 mg | ORAL_TABLET | Freq: Every day | ORAL | Status: DC

## 2011-04-29 MED ORDER — ATORVASTATIN CALCIUM 20 MG PO TABS: 20.0000 mg | ORAL_TABLET | Freq: Every day | ORAL | Status: DC

## 2011-04-29 MED ORDER — FLUTICASONE PROPIONATE 50 MCG/ACT NA SUSP: 2.0000 | Freq: Every day | NASAL | Status: DC

## 2011-04-29 MED ORDER — OMEPRAZOLE 40 MG PO CPDR: 40.0000 mg | DELAYED_RELEASE_CAPSULE | Freq: Every day | ORAL | Status: DC

## 2011-04-29 NOTE — Patient Instructions (Signed)
I will call your medications in and renew the Prilosec as well as lisinopril locally try the Livalo and let me know if you have any trouble with it.

## 2011-04-29 NOTE — Progress Notes (Signed)
  Subjective:    Patient ID: Bonnie Sanders, female    DOB: April 22, 1945, 66 y.o.   MRN: [id number]  HPI She is here for medication check. She does meet all of her medications renewed and sent to her new pharmacy. She is doing well on her thyroid medicine. She does complain of statin intolerance stating that 3 statin she is tired have all caused myalgias.. She continues on Fosamax as well as her blood pressure medications. Her reflux is under good control. She continues on Elavil and his been on this for several years. She is not interested in coming off at this time. Her reflux symptoms are under good control.   Review of Systems     Objective:   Physical Exam        Assessment & Plan:  Statin intolerance. Sample of Livalo given her age she is to call me in [place] she does on this. Guaiac cards also given. Her medications were renewed. 1. Allergic rhinitis due to pollen    2. GERD (gastroesophageal reflux disease)    3. Hyperlipidemia LDL goal < 130    4. Hypertension    5. Hypothyroid  TSH  6. Osteoporosis    7. Encounter for long-term (current) use of other medications  TSH

## 2011-05-07 ENCOUNTER — Other Ambulatory Visit (INDEPENDENT_AMBULATORY_CARE_PROVIDER_SITE_OTHER): Payer: Medicare Other

## 2011-05-07 DIAGNOSIS — Z1211 Encounter for screening for malignant neoplasm of colon: Secondary | ICD-10-CM | POA: Diagnosis not present

## 2011-05-07 LAB — HEMOCCULT GUIAC POC 1CARD (OFFICE): Card #2 Fecal Occult Blod, POC: NEGATIVE

## 2011-06-19 ENCOUNTER — Other Ambulatory Visit: Payer: Medicare Other

## 2011-06-19 DIAGNOSIS — E039 Hypothyroidism, unspecified: Secondary | ICD-10-CM

## 2011-06-19 LAB — TSH: TSH: 2.365 u[IU]/mL (ref 0.350–4.500)

## 2011-06-24 ENCOUNTER — Telehealth: Payer: Self-pay | Admitting: Family Medicine

## 2011-06-24 MED ORDER — LEVOTHYROXINE SODIUM 88 MCG PO TABS: 88.0000 ug | ORAL_TABLET | Freq: Every day | ORAL | Status: DC

## 2011-06-24 NOTE — Telephone Encounter (Signed)
Med sent in.

## 2011-08-14 ENCOUNTER — Ambulatory Visit (INDEPENDENT_AMBULATORY_CARE_PROVIDER_SITE_OTHER): Payer: Medicare Other | Admitting: Medical

## 2011-08-14 ENCOUNTER — Encounter: Payer: Self-pay | Admitting: Medical

## 2011-08-14 VITALS — BP 132/80 | HR 80 | Temp 98.8°F | Resp 16 | Wt 134.0 lb

## 2011-08-14 DIAGNOSIS — M810 Age-related osteoporosis without current pathological fracture: Secondary | ICD-10-CM

## 2011-08-14 DIAGNOSIS — M549 Dorsalgia, unspecified: Secondary | ICD-10-CM | POA: Diagnosis not present

## 2011-08-14 LAB — POCT URINALYSIS DIPSTICK
Bilirubin, UA: NEGATIVE
Nitrite, UA: NEGATIVE
pH, UA: 6

## 2011-08-14 NOTE — Progress Notes (Signed)
Subjective:    Bonnie Sanders is a 66 y.o. female who presents for evaluation of low back pain x a few months intermittent.  Other than sciatica 30 years ago no ongoing back problems prior til now.  Usually comes for a week or so, resolves, then comes back.  Pain is 5/10, preventing some of her daily activities, although she continues to walk her dog.  Sometimes getting up from squatting position worsens. Vacuuming floors 3 days ago aggravated the pain.   Bending causes some pain. Denies injury or trauma.  Pain is low mid back but sometimes both sides and up her back some too.  At times goes to left hip, and occasionally to side of left thigh.   Denies numbness or tingling in legs.  Has tried Motrin and Tylenol without relief.  Better with lying and no pain at night in bed.  No particular red flag symptoms.    Review of Systems Constitutional: denies fever, chills, sweats, unexpected weight change, anorexia, fatigue Cardiology: denies chest pain, palpitations, edema, orthopnea, paroxysmal nocturnal dyspnea Respiratory: denies cough, shortness of breath, dyspnea on exertion, wheezing, hemoptysis Gastroenterology: denies abdominal pain, nausea, vomiting, diarrhea, +occasional constipation, -blood in stool, -changes in bowel movement, dysphagia Musculoskeletal: denies arthralgias, joint swelling Urology: +hurts to strain to urinate when back is hurting; denies dysuria, difficulty urinating, hematuria, urinary frequency, urgency, incontinence Neurology: no headache, weakness, tingling, numbness     Past Medical History  Diagnosis Date  . Allergic rhinitis   . Hypothyroid   . Depression   . Hypertension   . GERD (gastroesophageal reflux disease)   . Osteoporosis   . Dyslipidemia     Objective:    Filed Vitals:   08/14/11 1415  BP: 132/80  Pulse: 80  Temp: 98.8 F (37.1 C)  Resp: 16    General appearance: alert, no distress, WD/WN,white female Abdomen: +bs, soft, non tender, non  distended, no masses, no hepatomegaly, no splenomegaly, no bruits Back: mild midline and paraspinal lumbar tenderness, no pain with percussion, full ROM without pain, no scoliosis Musculoskeletal: hips nontender, normal hip ROM Extremities: no edema Pulses: 2+ symmetric Neurological: strength normal lower extremities, sensation normal throughout, DTRs 2+ throughout    Assessment:   Encounter Diagnoses  Name Primary?  . Back pain Yes  . Osteoporosis     Plan:   Etiology likely osteoarthritis of L spine.   Will send urine culture given the leukocytes, will send for L spine xray given 2 mo hx/o and hx/o osteoporosis.  Advised she c/o walking regularly, but add stretching daily, and weight bearing exercise at least once weekly.  C/t Fosamax, Ca+D, and can use OTC Tylenol arthritis or Aleve prn.

## 2011-08-14 NOTE — Patient Instructions (Addendum)
Continue walking regularly.  Start doing more stretching too.  Use weight bearing exercise at least once weekly.  Try Tylenol Arthritis 500mg  - 650mg  twice daily or Aleve twice daily OTC for pain as needed.  Go for xray and we will call with xray results.    Osteoarthritis Osteoarthritis is the most common form of arthritis. It is redness, soreness, and swelling (inflammation) affecting the cartilage. Cartilage acts as a cushion, covering the ends of bones where they meet to form a joint. CAUSES  Over time, the cartilage begins to wear away. This causes bone to rub on bone. This produces pain and stiffness in the affected joints. Factors that contribute to this problem are:  Excessive body weight.   Age.   Overuse of joints.  SYMPTOMS   People with osteoarthritis usually experience joint pain, swelling, or stiffness.   Over time, the joint may lose its normal shape.   Small deposits of bone (osteophytes) may grow on the edges of the joint.   Bits of bone or cartilage can break off and float inside the joint space. This may cause more pain and damage.   Osteoarthritis can lead to depression, anxiety, feelings of helplessness, and limitations on daily activities.  The most commonly affected joints are in the:  Ends of the fingers.   Thumbs.   Neck.   Lower back.   Knees.   Hips.  DIAGNOSIS  Diagnosis is mostly based on your symptoms and exam. Tests may be helpful, including:  X-rays of the affected joint.   A computerized magnetic scan (MRI).   Blood tests to rule out other types of arthritis.   Joint fluid tests. This involves using a needle to draw fluid from the joint and examining the fluid under a microscope.  TREATMENT  Goals of treatment are to control pain, improve joint function, maintain a normal body weight, and maintain a healthy lifestyle. Treatment approaches may include:  A prescribed exercise program with rest and joint relief.   Weight control  with nutritional education.   Pain relief techniques such as:   Properly applied heat and cold.   Electric pulses delivered to nerve endings under the skin (transcutaneous electrical nerve stimulation, TENS).   Massage.   Certain supplements. Ask your caregiver before using any supplements, especially in combination with prescribed drugs.   Medicines to control pain, such as:   Acetaminophen.   Nonsteroidal anti-inflammatory drugs (NSAIDs), such as naproxen.   Narcotic or central-acting agents, such as tramadol. This drug carries a risk of addiction and is generally prescribed for short-term use.   Corticosteroids. These can be given orally or as injection. This is a short-term treatment, not recommended for routine use.   Surgery to reposition the bones and relieve pain (osteotomy) or to remove loose pieces of bone and cartilage. Joint replacement may be needed in advanced states of osteoarthritis.  HOME CARE INSTRUCTIONS  Your caregiver can recommend specific types of exercise. These may include:  Strengthening exercises. These are done to strengthen the muscles that support joints affected by arthritis. They can be performed with weights or with exercise bands to add resistance.   Aerobic activities. These are exercises, such as brisk walking or low-impact aerobics, that get your heart pumping. They can help keep your lungs and circulatory system in shape.   Range-of-motion activities. These keep your joints limber.   Balance and agility exercises. These help you maintain daily living skills.  Learning about your condition and being actively involved in  your care will help improve the course of your osteoarthritis. SEEK MEDICAL CARE IF:   You feel hot or your skin turns red.   You develop a rash in addition to your joint pain.   You have an oral temperature above 102 F (38.9 C).  FOR MORE INFORMATION  National Institute of Arthritis and Musculoskeletal and Skin  Diseases: www.niams.http://www.myers.net/ [employer] on Aging: https://walker.com/ American College of Rheumatology: www.rheumatology.org Document Released: 03/25/2005 Document Revised: 03/14/2011 Document Reviewed: 07/06/2009 [facility] Patient Information 2012 [place], [place].

## 2011-08-15 ENCOUNTER — Ambulatory Visit
Admission: RE | Admit: 2011-08-15 | Discharge: 2011-08-15 | Disposition: A | Discharge status: Home / Self Care | Payer: Medicare Other | Source: Ambulatory Visit | Attending: Medical | Admitting: Medical

## 2011-08-15 ENCOUNTER — Telehealth: Payer: Self-pay

## 2011-08-15 DIAGNOSIS — M47817 Spondylosis without myelopathy or radiculopathy, lumbosacral region: Secondary | ICD-10-CM | POA: Diagnosis not present

## 2011-08-15 DIAGNOSIS — M5137 Other intervertebral disc degeneration, lumbosacral region: Secondary | ICD-10-CM | POA: Diagnosis not present

## 2011-08-15 NOTE — Telephone Encounter (Signed)
[facility] called report but it is in here told them to tell pt you would be in contact with her

## 2011-08-16 ENCOUNTER — Other Ambulatory Visit: Payer: Self-pay | Admitting: Medical

## 2011-08-16 LAB — URINE CULTURE

## 2011-08-16 MED ORDER — CIPROFLOXACIN HCL 500 MG PO TABS: 500.0000 mg | ORAL_TABLET | Freq: Two times a day (BID) | ORAL | Status: AC

## 2011-11-11 ENCOUNTER — Telehealth: Payer: Self-pay | Admitting: Family Medicine

## 2011-11-11 MED ORDER — LEVOTHYROXINE SODIUM 88 MCG PO TABS: 88.0000 ug | ORAL_TABLET | Freq: Every day | ORAL | Status: DC

## 2011-11-11 NOTE — Telephone Encounter (Signed)
Med sent in.

## 2011-12-10 DIAGNOSIS — H25049 Posterior subcapsular polar age-related cataract, unspecified eye: Secondary | ICD-10-CM | POA: Diagnosis not present

## 2011-12-10 DIAGNOSIS — H52209 Unspecified astigmatism, unspecified eye: Secondary | ICD-10-CM | POA: Diagnosis not present

## 2011-12-10 DIAGNOSIS — H01009 Unspecified blepharitis unspecified eye, unspecified eyelid: Secondary | ICD-10-CM | POA: Diagnosis not present

## 2011-12-10 DIAGNOSIS — H2510 Age-related nuclear cataract, unspecified eye: Secondary | ICD-10-CM | POA: Diagnosis not present

## 2011-12-16 DIAGNOSIS — Z23 Encounter for immunization: Secondary | ICD-10-CM | POA: Diagnosis not present

## 2011-12-18 DIAGNOSIS — H2510 Age-related nuclear cataract, unspecified eye: Secondary | ICD-10-CM | POA: Diagnosis not present

## 2011-12-18 DIAGNOSIS — H25049 Posterior subcapsular polar age-related cataract, unspecified eye: Secondary | ICD-10-CM | POA: Diagnosis not present

## 2011-12-18 DIAGNOSIS — H52209 Unspecified astigmatism, unspecified eye: Secondary | ICD-10-CM | POA: Diagnosis not present

## 2011-12-18 DIAGNOSIS — H2589 Other age-related cataract: Secondary | ICD-10-CM | POA: Diagnosis not present

## 2012-03-02 ENCOUNTER — Other Ambulatory Visit: Payer: Self-pay | Admitting: Family Medicine

## 2012-03-02 DIAGNOSIS — R195 Other fecal abnormalities: Secondary | ICD-10-CM | POA: Diagnosis not present

## 2012-03-02 DIAGNOSIS — F329 Major depressive disorder, single episode, unspecified: Secondary | ICD-10-CM | POA: Diagnosis not present

## 2012-03-02 DIAGNOSIS — I10 Essential (primary) hypertension: Secondary | ICD-10-CM | POA: Diagnosis not present

## 2012-03-02 DIAGNOSIS — Z1231 Encounter for screening mammogram for malignant neoplasm of breast: Secondary | ICD-10-CM

## 2012-03-02 DIAGNOSIS — E039 Hypothyroidism, unspecified: Secondary | ICD-10-CM | POA: Diagnosis not present

## 2012-03-02 DIAGNOSIS — K219 Gastro-esophageal reflux disease without esophagitis: Secondary | ICD-10-CM | POA: Diagnosis not present

## 2012-03-02 DIAGNOSIS — F32A Depression, unspecified: Secondary | ICD-10-CM | POA: Insufficient documentation

## 2012-03-03 ENCOUNTER — Encounter: Payer: Self-pay | Admitting: Internal Medicine

## 2012-03-12 DIAGNOSIS — Z23 Encounter for immunization: Secondary | ICD-10-CM | POA: Diagnosis not present

## 2012-03-17 ENCOUNTER — Encounter: Payer: Medicare Other | Admitting: Family Medicine

## 2012-03-20 DIAGNOSIS — R195 Other fecal abnormalities: Secondary | ICD-10-CM | POA: Diagnosis not present

## 2012-03-24 DIAGNOSIS — K635 Polyp of colon: Secondary | ICD-10-CM | POA: Insufficient documentation

## 2012-04-02 ENCOUNTER — Encounter: Payer: Self-pay | Admitting: Internal Medicine

## 2012-04-16 ENCOUNTER — Ambulatory Visit: Payer: Medicare Other

## 2012-04-21 LAB — HM COLONOSCOPY

## 2012-04-24 DIAGNOSIS — R195 Other fecal abnormalities: Secondary | ICD-10-CM | POA: Diagnosis not present

## 2012-04-24 DIAGNOSIS — R198 Other specified symptoms and signs involving the digestive system and abdomen: Secondary | ICD-10-CM | POA: Diagnosis not present

## 2012-05-06 ENCOUNTER — Other Ambulatory Visit: Payer: Self-pay | Admitting: Family Medicine

## 2012-05-06 ENCOUNTER — Ambulatory Visit
Admission: RE | Admit: 2012-05-06 | Discharge: 2012-05-06 | Disposition: A | Discharge status: Home / Self Care | Payer: Medicare Other | Source: Ambulatory Visit | Attending: Family Medicine | Admitting: Family Medicine

## 2012-05-06 DIAGNOSIS — Z1231 Encounter for screening mammogram for malignant neoplasm of breast: Secondary | ICD-10-CM

## 2012-05-29 DIAGNOSIS — E039 Hypothyroidism, unspecified: Secondary | ICD-10-CM | POA: Diagnosis not present

## 2012-05-29 DIAGNOSIS — E785 Hyperlipidemia, unspecified: Secondary | ICD-10-CM | POA: Diagnosis not present

## 2012-06-02 DIAGNOSIS — M899 Disorder of bone, unspecified: Secondary | ICD-10-CM | POA: Diagnosis not present

## 2012-06-02 DIAGNOSIS — E785 Hyperlipidemia, unspecified: Secondary | ICD-10-CM | POA: Diagnosis not present

## 2012-06-02 DIAGNOSIS — I10 Essential (primary) hypertension: Secondary | ICD-10-CM | POA: Diagnosis not present

## 2012-06-02 DIAGNOSIS — E039 Hypothyroidism, unspecified: Secondary | ICD-10-CM | POA: Diagnosis not present

## 2012-06-02 DIAGNOSIS — Z23 Encounter for immunization: Secondary | ICD-10-CM | POA: Diagnosis not present

## 2012-06-23 DIAGNOSIS — Z96659 Presence of unspecified artificial knee joint: Secondary | ICD-10-CM | POA: Diagnosis not present

## 2012-09-01 DIAGNOSIS — M79609 Pain in unspecified limb: Secondary | ICD-10-CM | POA: Diagnosis not present

## 2012-09-03 DIAGNOSIS — B070 Plantar wart: Secondary | ICD-10-CM | POA: Diagnosis not present

## 2012-09-03 DIAGNOSIS — B078 Other viral warts: Secondary | ICD-10-CM | POA: Diagnosis not present

## 2012-09-03 DIAGNOSIS — Z85828 Personal history of other malignant neoplasm of skin: Secondary | ICD-10-CM | POA: Diagnosis not present

## 2012-09-21 DIAGNOSIS — E039 Hypothyroidism, unspecified: Secondary | ICD-10-CM | POA: Diagnosis not present

## 2012-09-21 DIAGNOSIS — I10 Essential (primary) hypertension: Secondary | ICD-10-CM | POA: Diagnosis not present

## 2012-09-21 DIAGNOSIS — E785 Hyperlipidemia, unspecified: Secondary | ICD-10-CM | POA: Diagnosis not present

## 2012-12-26 DIAGNOSIS — Z23 Encounter for immunization: Secondary | ICD-10-CM | POA: Diagnosis not present

## 2012-12-30 DIAGNOSIS — E785 Hyperlipidemia, unspecified: Secondary | ICD-10-CM | POA: Diagnosis not present

## 2012-12-30 DIAGNOSIS — I10 Essential (primary) hypertension: Secondary | ICD-10-CM | POA: Diagnosis not present

## 2012-12-30 DIAGNOSIS — E039 Hypothyroidism, unspecified: Secondary | ICD-10-CM | POA: Diagnosis not present

## 2013-01-18 DIAGNOSIS — H04129 Dry eye syndrome of unspecified lacrimal gland: Secondary | ICD-10-CM | POA: Diagnosis not present

## 2013-01-18 DIAGNOSIS — Z961 Presence of intraocular lens: Secondary | ICD-10-CM | POA: Diagnosis not present

## 2013-01-18 DIAGNOSIS — H01009 Unspecified blepharitis unspecified eye, unspecified eyelid: Secondary | ICD-10-CM | POA: Diagnosis not present

## 2013-03-19 DIAGNOSIS — K649 Unspecified hemorrhoids: Secondary | ICD-10-CM | POA: Diagnosis not present

## 2013-04-12 DIAGNOSIS — K649 Unspecified hemorrhoids: Secondary | ICD-10-CM | POA: Diagnosis not present

## 2013-04-14 ENCOUNTER — Other Ambulatory Visit: Payer: Self-pay

## 2013-04-14 DIAGNOSIS — Z1231 Encounter for screening mammogram for malignant neoplasm of breast: Secondary | ICD-10-CM

## 2013-04-23 ENCOUNTER — Ambulatory Visit (INDEPENDENT_AMBULATORY_CARE_PROVIDER_SITE_OTHER): Payer: Medicare Other | Admitting: Podiatrist

## 2013-04-23 VITALS — BP 118/60 | HR 85 | Resp 18

## 2013-04-23 DIAGNOSIS — L84 Corns and callosities: Secondary | ICD-10-CM | POA: Diagnosis not present

## 2013-04-23 NOTE — Patient Instructions (Signed)
Look for lambswool  To put in between the toes-- you can find this at [facility] or [facility] in the foot care section

## 2013-04-23 NOTE — Progress Notes (Signed)
   Subjective:    Patient ID: Bonnie Sanders, female    DOB: 10/31/1945, 68 y.o.   MRN: [id number]  HPI There is a place on the big toe on my left foot and has been going on for 2 months and it just hurts and sore and tender    Review of Systems  Constitutional: Negative.   Eyes: Negative.   Respiratory: Negative.   Cardiovascular: Negative.   Gastrointestinal: Negative.   Endocrine: Negative.   Genitourinary: Negative.   Musculoskeletal:       Joint pain  Skin: Negative.   Allergic/Immunologic: Positive for environmental allergies.  Neurological: Positive for numbness.  Hematological: Negative.   Psychiatric/Behavioral: Negative.        Objective:   Physical Exam GENERAL APPEARANCE: Alert, conversant. Appropriately groomed. No acute distress.  VASCULAR: Pedal pulses palpable and strong bilateral.  Capillary refill time is immediate to all digits,  Proximal to distal cooling it warm to warm.  Digital hair growth is present bilateral  NEUROLOGIC: sensation is intact epicritically and protectively to 5.07 monofilament at 5/5 sites bilateral.  Light touch is intact bilateral, vibratory sensation intact bilateral, achilles tendon reflex is intact bilateral.  MUSCULOSKELETAL: acceptable muscle strength, tone and stability bilateral.  DERMATOLOGIC: hyperkeratotic lesion present medial aspect of the left great toe.  Not swollen or red.  symptomatic with pressure and shoes.       Assessment & Plan:  Corn medial side of left great toe  Plan:  Debridement carried out today with a 15 blade.  No iatrogenic bleeding noted.  Padding dispensed and patient given instructions for home care.  She will be seen back prn

## 2013-05-03 DIAGNOSIS — K649 Unspecified hemorrhoids: Secondary | ICD-10-CM | POA: Diagnosis not present

## 2013-05-07 ENCOUNTER — Ambulatory Visit
Admission: RE | Admit: 2013-05-07 | Discharge: 2013-05-07 | Disposition: A | Discharge status: Home / Self Care | Payer: Medicare Other | Source: Ambulatory Visit

## 2013-05-07 DIAGNOSIS — Z1231 Encounter for screening mammogram for malignant neoplasm of breast: Secondary | ICD-10-CM | POA: Diagnosis not present

## 2013-06-02 DIAGNOSIS — E785 Hyperlipidemia, unspecified: Secondary | ICD-10-CM | POA: Diagnosis not present

## 2013-06-02 DIAGNOSIS — E039 Hypothyroidism, unspecified: Secondary | ICD-10-CM | POA: Diagnosis not present

## 2013-06-02 DIAGNOSIS — E538 Deficiency of other specified B group vitamins: Secondary | ICD-10-CM | POA: Diagnosis not present

## 2013-06-02 DIAGNOSIS — F329 Major depressive disorder, single episode, unspecified: Secondary | ICD-10-CM | POA: Diagnosis not present

## 2013-06-02 DIAGNOSIS — I10 Essential (primary) hypertension: Secondary | ICD-10-CM | POA: Diagnosis not present

## 2013-06-02 DIAGNOSIS — F3289 Other specified depressive episodes: Secondary | ICD-10-CM | POA: Diagnosis not present

## 2013-06-02 DIAGNOSIS — K219 Gastro-esophageal reflux disease without esophagitis: Secondary | ICD-10-CM | POA: Diagnosis not present

## 2013-08-20 DIAGNOSIS — H01009 Unspecified blepharitis unspecified eye, unspecified eyelid: Secondary | ICD-10-CM | POA: Diagnosis not present

## 2013-09-20 DIAGNOSIS — J3089 Other allergic rhinitis: Secondary | ICD-10-CM | POA: Diagnosis not present

## 2013-09-20 DIAGNOSIS — H04129 Dry eye syndrome of unspecified lacrimal gland: Secondary | ICD-10-CM | POA: Diagnosis not present

## 2013-09-20 DIAGNOSIS — H01009 Unspecified blepharitis unspecified eye, unspecified eyelid: Secondary | ICD-10-CM | POA: Diagnosis not present

## 2013-10-22 DIAGNOSIS — M5137 Other intervertebral disc degeneration, lumbosacral region: Secondary | ICD-10-CM | POA: Diagnosis not present

## 2013-10-22 DIAGNOSIS — M1610 Unilateral primary osteoarthritis, unspecified hip: Secondary | ICD-10-CM | POA: Diagnosis not present

## 2013-10-22 DIAGNOSIS — M25559 Pain in unspecified hip: Secondary | ICD-10-CM | POA: Diagnosis not present

## 2013-10-22 DIAGNOSIS — M169 Osteoarthritis of hip, unspecified: Secondary | ICD-10-CM | POA: Diagnosis not present

## 2013-10-27 DIAGNOSIS — M25559 Pain in unspecified hip: Secondary | ICD-10-CM | POA: Diagnosis not present

## 2013-10-28 DIAGNOSIS — M25559 Pain in unspecified hip: Secondary | ICD-10-CM | POA: Diagnosis not present

## 2013-11-01 DIAGNOSIS — M25559 Pain in unspecified hip: Secondary | ICD-10-CM | POA: Diagnosis not present

## 2013-11-05 DIAGNOSIS — M25559 Pain in unspecified hip: Secondary | ICD-10-CM | POA: Diagnosis not present

## 2013-11-11 DIAGNOSIS — M25559 Pain in unspecified hip: Secondary | ICD-10-CM | POA: Diagnosis not present

## 2013-11-17 ENCOUNTER — Other Ambulatory Visit: Payer: Self-pay | Admitting: Orthopedic Surgery

## 2013-11-17 DIAGNOSIS — M169 Osteoarthritis of hip, unspecified: Secondary | ICD-10-CM | POA: Diagnosis not present

## 2013-11-17 DIAGNOSIS — R531 Weakness: Secondary | ICD-10-CM

## 2013-11-17 DIAGNOSIS — M1610 Unilateral primary osteoarthritis, unspecified hip: Secondary | ICD-10-CM | POA: Diagnosis not present

## 2013-11-17 DIAGNOSIS — M25552 Pain in left hip: Secondary | ICD-10-CM | POA: Insufficient documentation

## 2013-11-17 DIAGNOSIS — M25559 Pain in unspecified hip: Secondary | ICD-10-CM | POA: Diagnosis not present

## 2013-11-17 HISTORY — DX: Pain in left hip: M25.552

## 2013-11-18 DIAGNOSIS — M25559 Pain in unspecified hip: Secondary | ICD-10-CM | POA: Diagnosis not present

## 2013-12-03 ENCOUNTER — Ambulatory Visit
Admission: RE | Admit: 2013-12-03 | Discharge: 2013-12-03 | Disposition: A | Discharge status: Home / Self Care | Payer: Medicare Other | Source: Ambulatory Visit | Attending: Orthopedic Surgery | Admitting: Orthopedic Surgery

## 2013-12-03 DIAGNOSIS — M25552 Pain in left hip: Secondary | ICD-10-CM

## 2013-12-03 DIAGNOSIS — R531 Weakness: Secondary | ICD-10-CM

## 2013-12-03 DIAGNOSIS — M169 Osteoarthritis of hip, unspecified: Secondary | ICD-10-CM | POA: Diagnosis not present

## 2013-12-03 DIAGNOSIS — M1610 Unilateral primary osteoarthritis, unspecified hip: Secondary | ICD-10-CM | POA: Diagnosis not present

## 2013-12-07 DIAGNOSIS — M5440 Lumbago with sciatica, unspecified side: Secondary | ICD-10-CM | POA: Insufficient documentation

## 2013-12-07 DIAGNOSIS — M5137 Other intervertebral disc degeneration, lumbosacral region: Secondary | ICD-10-CM | POA: Diagnosis not present

## 2013-12-07 DIAGNOSIS — M5430 Sciatica, unspecified side: Secondary | ICD-10-CM | POA: Diagnosis not present

## 2013-12-08 ENCOUNTER — Other Ambulatory Visit: Payer: Self-pay | Admitting: Orthopedic Surgery

## 2013-12-08 DIAGNOSIS — M5442 Lumbago with sciatica, left side: Secondary | ICD-10-CM

## 2013-12-10 ENCOUNTER — Ambulatory Visit
Admission: RE | Admit: 2013-12-10 | Discharge: 2013-12-10 | Disposition: A | Discharge status: Home / Self Care | Payer: Medicare Other | Source: Ambulatory Visit | Attending: Orthopedic Surgery | Admitting: Orthopedic Surgery

## 2013-12-10 DIAGNOSIS — M5137 Other intervertebral disc degeneration, lumbosacral region: Secondary | ICD-10-CM | POA: Diagnosis not present

## 2013-12-10 DIAGNOSIS — M48061 Spinal stenosis, lumbar region without neurogenic claudication: Secondary | ICD-10-CM | POA: Diagnosis not present

## 2013-12-10 DIAGNOSIS — M5442 Lumbago with sciatica, left side: Secondary | ICD-10-CM

## 2013-12-10 DIAGNOSIS — M47817 Spondylosis without myelopathy or radiculopathy, lumbosacral region: Secondary | ICD-10-CM | POA: Diagnosis not present

## 2013-12-11 DIAGNOSIS — Z23 Encounter for immunization: Secondary | ICD-10-CM | POA: Diagnosis not present

## 2013-12-15 DIAGNOSIS — M5430 Sciatica, unspecified side: Secondary | ICD-10-CM | POA: Diagnosis not present

## 2013-12-22 DIAGNOSIS — IMO0002 Reserved for concepts with insufficient information to code with codable children: Secondary | ICD-10-CM | POA: Diagnosis not present

## 2013-12-22 DIAGNOSIS — M5126 Other intervertebral disc displacement, lumbar region: Secondary | ICD-10-CM | POA: Diagnosis not present

## 2013-12-30 DIAGNOSIS — IMO0002 Reserved for concepts with insufficient information to code with codable children: Secondary | ICD-10-CM | POA: Diagnosis not present

## 2013-12-30 DIAGNOSIS — M5126 Other intervertebral disc displacement, lumbar region: Secondary | ICD-10-CM | POA: Diagnosis not present

## 2014-01-04 DIAGNOSIS — M5126 Other intervertebral disc displacement, lumbar region: Secondary | ICD-10-CM | POA: Diagnosis not present

## 2014-01-04 DIAGNOSIS — M549 Dorsalgia, unspecified: Secondary | ICD-10-CM | POA: Diagnosis not present

## 2014-01-10 DIAGNOSIS — I10 Essential (primary) hypertension: Secondary | ICD-10-CM | POA: Diagnosis not present

## 2014-01-10 DIAGNOSIS — E039 Hypothyroidism, unspecified: Secondary | ICD-10-CM | POA: Diagnosis not present

## 2014-01-10 DIAGNOSIS — D649 Anemia, unspecified: Secondary | ICD-10-CM | POA: Diagnosis not present

## 2014-01-10 DIAGNOSIS — E782 Mixed hyperlipidemia: Secondary | ICD-10-CM | POA: Diagnosis not present

## 2014-01-10 DIAGNOSIS — K219 Gastro-esophageal reflux disease without esophagitis: Secondary | ICD-10-CM | POA: Diagnosis not present

## 2014-01-10 DIAGNOSIS — J309 Allergic rhinitis, unspecified: Secondary | ICD-10-CM | POA: Diagnosis not present

## 2014-01-10 DIAGNOSIS — E559 Vitamin D deficiency, unspecified: Secondary | ICD-10-CM | POA: Diagnosis not present

## 2014-01-13 DIAGNOSIS — M5126 Other intervertebral disc displacement, lumbar region: Secondary | ICD-10-CM | POA: Diagnosis not present

## 2014-01-13 DIAGNOSIS — M5116 Intervertebral disc disorders with radiculopathy, lumbar region: Secondary | ICD-10-CM | POA: Diagnosis not present

## 2014-01-24 DIAGNOSIS — H01004 Unspecified blepharitis left upper eyelid: Secondary | ICD-10-CM | POA: Diagnosis not present

## 2014-01-24 DIAGNOSIS — Z961 Presence of intraocular lens: Secondary | ICD-10-CM | POA: Diagnosis not present

## 2014-01-24 DIAGNOSIS — H43813 Vitreous degeneration, bilateral: Secondary | ICD-10-CM | POA: Diagnosis not present

## 2014-01-24 DIAGNOSIS — H01001 Unspecified blepharitis right upper eyelid: Secondary | ICD-10-CM | POA: Diagnosis not present

## 2014-01-28 DIAGNOSIS — M5116 Intervertebral disc disorders with radiculopathy, lumbar region: Secondary | ICD-10-CM | POA: Diagnosis not present

## 2014-01-28 DIAGNOSIS — M5126 Other intervertebral disc displacement, lumbar region: Secondary | ICD-10-CM | POA: Diagnosis not present

## 2014-02-01 DIAGNOSIS — M6281 Muscle weakness (generalized): Secondary | ICD-10-CM | POA: Diagnosis not present

## 2014-02-01 DIAGNOSIS — M5116 Intervertebral disc disorders with radiculopathy, lumbar region: Secondary | ICD-10-CM | POA: Diagnosis not present

## 2014-02-01 DIAGNOSIS — M545 Low back pain: Secondary | ICD-10-CM | POA: Diagnosis not present

## 2014-02-01 DIAGNOSIS — M5416 Radiculopathy, lumbar region: Secondary | ICD-10-CM | POA: Diagnosis not present

## 2014-02-03 DIAGNOSIS — M5416 Radiculopathy, lumbar region: Secondary | ICD-10-CM | POA: Diagnosis not present

## 2014-02-03 DIAGNOSIS — M6281 Muscle weakness (generalized): Secondary | ICD-10-CM | POA: Diagnosis not present

## 2014-02-03 DIAGNOSIS — M545 Low back pain: Secondary | ICD-10-CM | POA: Diagnosis not present

## 2014-02-03 DIAGNOSIS — M5116 Intervertebral disc disorders with radiculopathy, lumbar region: Secondary | ICD-10-CM | POA: Diagnosis not present

## 2014-02-08 DIAGNOSIS — M5116 Intervertebral disc disorders with radiculopathy, lumbar region: Secondary | ICD-10-CM | POA: Diagnosis not present

## 2014-02-08 DIAGNOSIS — M545 Low back pain: Secondary | ICD-10-CM | POA: Diagnosis not present

## 2014-02-08 DIAGNOSIS — M6281 Muscle weakness (generalized): Secondary | ICD-10-CM | POA: Diagnosis not present

## 2014-02-10 DIAGNOSIS — M5116 Intervertebral disc disorders with radiculopathy, lumbar region: Secondary | ICD-10-CM | POA: Diagnosis not present

## 2014-02-10 DIAGNOSIS — M5416 Radiculopathy, lumbar region: Secondary | ICD-10-CM | POA: Diagnosis not present

## 2014-02-10 DIAGNOSIS — M545 Low back pain: Secondary | ICD-10-CM | POA: Diagnosis not present

## 2014-02-10 DIAGNOSIS — M6281 Muscle weakness (generalized): Secondary | ICD-10-CM | POA: Diagnosis not present

## 2014-02-17 DIAGNOSIS — Z85828 Personal history of other malignant neoplasm of skin: Secondary | ICD-10-CM | POA: Diagnosis not present

## 2014-02-17 DIAGNOSIS — B078 Other viral warts: Secondary | ICD-10-CM | POA: Diagnosis not present

## 2014-02-17 DIAGNOSIS — L570 Actinic keratosis: Secondary | ICD-10-CM | POA: Diagnosis not present

## 2014-02-17 DIAGNOSIS — L280 Lichen simplex chronicus: Secondary | ICD-10-CM | POA: Diagnosis not present

## 2014-03-01 DIAGNOSIS — M5416 Radiculopathy, lumbar region: Secondary | ICD-10-CM | POA: Diagnosis not present

## 2014-03-01 DIAGNOSIS — M545 Low back pain: Secondary | ICD-10-CM | POA: Diagnosis not present

## 2014-03-01 DIAGNOSIS — M5116 Intervertebral disc disorders with radiculopathy, lumbar region: Secondary | ICD-10-CM | POA: Diagnosis not present

## 2014-03-10 DIAGNOSIS — D519 Vitamin B12 deficiency anemia, unspecified: Secondary | ICD-10-CM | POA: Diagnosis not present

## 2014-03-10 DIAGNOSIS — I10 Essential (primary) hypertension: Secondary | ICD-10-CM | POA: Diagnosis not present

## 2014-03-10 DIAGNOSIS — J309 Allergic rhinitis, unspecified: Secondary | ICD-10-CM | POA: Diagnosis not present

## 2014-03-10 DIAGNOSIS — Z23 Encounter for immunization: Secondary | ICD-10-CM | POA: Diagnosis not present

## 2014-03-10 DIAGNOSIS — D649 Anemia, unspecified: Secondary | ICD-10-CM | POA: Diagnosis not present

## 2014-03-10 DIAGNOSIS — E782 Mixed hyperlipidemia: Secondary | ICD-10-CM | POA: Diagnosis not present

## 2014-04-04 DIAGNOSIS — Z136 Encounter for screening for cardiovascular disorders: Secondary | ICD-10-CM | POA: Diagnosis not present

## 2014-04-04 DIAGNOSIS — D649 Anemia, unspecified: Secondary | ICD-10-CM | POA: Diagnosis not present

## 2014-04-06 DIAGNOSIS — D649 Anemia, unspecified: Secondary | ICD-10-CM | POA: Diagnosis not present

## 2014-04-06 DIAGNOSIS — E039 Hypothyroidism, unspecified: Secondary | ICD-10-CM | POA: Diagnosis not present

## 2014-04-06 DIAGNOSIS — I10 Essential (primary) hypertension: Secondary | ICD-10-CM | POA: Diagnosis not present

## 2014-04-06 DIAGNOSIS — E782 Mixed hyperlipidemia: Secondary | ICD-10-CM | POA: Diagnosis not present

## 2014-04-18 ENCOUNTER — Other Ambulatory Visit: Payer: Self-pay

## 2014-04-18 DIAGNOSIS — Z1231 Encounter for screening mammogram for malignant neoplasm of breast: Secondary | ICD-10-CM

## 2014-05-09 ENCOUNTER — Ambulatory Visit
Admission: RE | Admit: 2014-05-09 | Discharge: 2014-05-09 | Disposition: A | Discharge status: Home / Self Care | Payer: Medicare Other | Source: Ambulatory Visit

## 2014-05-09 ENCOUNTER — Encounter (INDEPENDENT_AMBULATORY_CARE_PROVIDER_SITE_OTHER): Payer: Self-pay

## 2014-05-09 DIAGNOSIS — Z1231 Encounter for screening mammogram for malignant neoplasm of breast: Secondary | ICD-10-CM | POA: Diagnosis not present

## 2014-05-11 DIAGNOSIS — I10 Essential (primary) hypertension: Secondary | ICD-10-CM | POA: Diagnosis not present

## 2014-05-11 DIAGNOSIS — E782 Mixed hyperlipidemia: Secondary | ICD-10-CM | POA: Diagnosis not present

## 2014-05-11 DIAGNOSIS — E039 Hypothyroidism, unspecified: Secondary | ICD-10-CM | POA: Diagnosis not present

## 2014-05-11 DIAGNOSIS — Z1211 Encounter for screening for malignant neoplasm of colon: Secondary | ICD-10-CM | POA: Diagnosis not present

## 2014-05-11 DIAGNOSIS — Z124 Encounter for screening for malignant neoplasm of cervix: Secondary | ICD-10-CM | POA: Diagnosis not present

## 2014-05-12 DIAGNOSIS — M545 Low back pain: Secondary | ICD-10-CM | POA: Diagnosis not present

## 2014-06-15 DIAGNOSIS — M25552 Pain in left hip: Secondary | ICD-10-CM | POA: Diagnosis not present

## 2014-06-24 DIAGNOSIS — M545 Low back pain: Secondary | ICD-10-CM | POA: Diagnosis not present

## 2014-06-24 DIAGNOSIS — M47817 Spondylosis without myelopathy or radiculopathy, lumbosacral region: Secondary | ICD-10-CM | POA: Diagnosis not present

## 2014-07-18 DIAGNOSIS — M545 Low back pain: Secondary | ICD-10-CM | POA: Diagnosis not present

## 2014-07-18 DIAGNOSIS — M47817 Spondylosis without myelopathy or radiculopathy, lumbosacral region: Secondary | ICD-10-CM | POA: Diagnosis not present

## 2014-07-19 DIAGNOSIS — M5127 Other intervertebral disc displacement, lumbosacral region: Secondary | ICD-10-CM | POA: Diagnosis not present

## 2014-07-19 DIAGNOSIS — J309 Allergic rhinitis, unspecified: Secondary | ICD-10-CM | POA: Diagnosis not present

## 2014-07-19 DIAGNOSIS — M549 Dorsalgia, unspecified: Secondary | ICD-10-CM | POA: Diagnosis not present

## 2014-07-29 DIAGNOSIS — M545 Low back pain: Secondary | ICD-10-CM | POA: Diagnosis not present

## 2014-07-29 DIAGNOSIS — M5416 Radiculopathy, lumbar region: Secondary | ICD-10-CM | POA: Diagnosis not present

## 2014-07-29 DIAGNOSIS — M47817 Spondylosis without myelopathy or radiculopathy, lumbosacral region: Secondary | ICD-10-CM | POA: Diagnosis not present

## 2014-07-29 DIAGNOSIS — M5116 Intervertebral disc disorders with radiculopathy, lumbar region: Secondary | ICD-10-CM | POA: Diagnosis not present

## 2014-08-01 DIAGNOSIS — M5116 Intervertebral disc disorders with radiculopathy, lumbar region: Secondary | ICD-10-CM | POA: Diagnosis not present

## 2014-08-08 ENCOUNTER — Other Ambulatory Visit: Payer: Self-pay | Admitting: Neurosurgery

## 2014-08-08 DIAGNOSIS — M5116 Intervertebral disc disorders with radiculopathy, lumbar region: Secondary | ICD-10-CM

## 2014-08-22 ENCOUNTER — Encounter: Payer: Self-pay | Admitting: Podiatry

## 2014-08-22 ENCOUNTER — Ambulatory Visit (INDEPENDENT_AMBULATORY_CARE_PROVIDER_SITE_OTHER): Payer: Medicare Other | Admitting: Podiatry

## 2014-08-22 VITALS — BP 123/66 | HR 68 | Resp 11 | Ht 61.0 in | Wt 122.0 lb

## 2014-08-22 DIAGNOSIS — L600 Ingrowing nail: Secondary | ICD-10-CM

## 2014-08-22 NOTE — Progress Notes (Signed)
   Subjective:    Patient ID: Bonnie Sanders, female    DOB: 08/04/1945, 69 y.o.   MRN: [id number]  HPI    Review of Systems  All other systems reviewed and are negative.      Objective:   Physical Exam        Assessment & Plan:

## 2014-08-22 NOTE — Progress Notes (Signed)
Subjective:     Patient ID: Bonnie Sanders, female   DOB: 18-Dec-1945, [age] y.o.   MRN: [id number]  HPI patient presents stating my right toe has really been bothering me and making it difficult to wear shoe gear with. I bumped it several months ago   Review of Systems  All other systems reviewed and are negative.      Objective:   Physical Exam  Cardiovascular: Intact distal pulses.   Musculoskeletal: Normal range of motion.  Nursing note and vitals reviewed.  neurovascular status intact muscle strength adequate range of motion within normal limits. Patient's right hallux nail is loose damaged and painful when pressed with inability to wear shoe gear comfortably     Assessment:     Damaged right hallux nail with trauma upon palpation and fluid buildup    Plan:     H&P and condition explained to patient. I've recommended removal of the nail allowing a new nail to regrow and explained procedure. Patient wants surgery and today I infiltrated 60 mg like Marcaine mixture remove the nail flush the area out and applied sterile dressing. Instructed on soaks and reappoint

## 2014-08-23 DIAGNOSIS — M545 Low back pain: Secondary | ICD-10-CM | POA: Diagnosis not present

## 2014-08-23 DIAGNOSIS — M47817 Spondylosis without myelopathy or radiculopathy, lumbosacral region: Secondary | ICD-10-CM | POA: Diagnosis not present

## 2014-08-23 DIAGNOSIS — M5116 Intervertebral disc disorders with radiculopathy, lumbar region: Secondary | ICD-10-CM | POA: Diagnosis not present

## 2014-08-27 ENCOUNTER — Ambulatory Visit
Admission: RE | Admit: 2014-08-27 | Discharge: 2014-08-27 | Disposition: A | Discharge status: Home / Self Care | Payer: Medicare Other | Source: Ambulatory Visit | Attending: Neurosurgery | Admitting: Neurosurgery

## 2014-08-27 DIAGNOSIS — M5126 Other intervertebral disc displacement, lumbar region: Secondary | ICD-10-CM | POA: Diagnosis not present

## 2014-08-27 DIAGNOSIS — M5116 Intervertebral disc disorders with radiculopathy, lumbar region: Secondary | ICD-10-CM

## 2014-08-27 DIAGNOSIS — M4806 Spinal stenosis, lumbar region: Secondary | ICD-10-CM | POA: Diagnosis not present

## 2014-08-27 DIAGNOSIS — M5136 Other intervertebral disc degeneration, lumbar region: Secondary | ICD-10-CM | POA: Diagnosis not present

## 2014-08-27 DIAGNOSIS — M4727 Other spondylosis with radiculopathy, lumbosacral region: Secondary | ICD-10-CM | POA: Diagnosis not present

## 2014-09-01 DIAGNOSIS — M5116 Intervertebral disc disorders with radiculopathy, lumbar region: Secondary | ICD-10-CM | POA: Diagnosis not present

## 2014-09-12 DIAGNOSIS — E782 Mixed hyperlipidemia: Secondary | ICD-10-CM | POA: Diagnosis not present

## 2014-09-12 DIAGNOSIS — E039 Hypothyroidism, unspecified: Secondary | ICD-10-CM | POA: Diagnosis not present

## 2014-09-13 DIAGNOSIS — K219 Gastro-esophageal reflux disease without esophagitis: Secondary | ICD-10-CM | POA: Diagnosis not present

## 2014-09-13 DIAGNOSIS — E039 Hypothyroidism, unspecified: Secondary | ICD-10-CM | POA: Diagnosis not present

## 2014-09-13 DIAGNOSIS — I10 Essential (primary) hypertension: Secondary | ICD-10-CM | POA: Diagnosis not present

## 2014-09-13 DIAGNOSIS — E782 Mixed hyperlipidemia: Secondary | ICD-10-CM | POA: Diagnosis not present

## 2014-10-26 DIAGNOSIS — E039 Hypothyroidism, unspecified: Secondary | ICD-10-CM | POA: Diagnosis not present

## 2014-10-27 DIAGNOSIS — E039 Hypothyroidism, unspecified: Secondary | ICD-10-CM | POA: Diagnosis not present

## 2014-10-27 DIAGNOSIS — I10 Essential (primary) hypertension: Secondary | ICD-10-CM | POA: Diagnosis not present

## 2014-10-27 DIAGNOSIS — K219 Gastro-esophageal reflux disease without esophagitis: Secondary | ICD-10-CM | POA: Diagnosis not present

## 2014-10-27 DIAGNOSIS — E782 Mixed hyperlipidemia: Secondary | ICD-10-CM | POA: Diagnosis not present

## 2014-11-07 ENCOUNTER — Encounter: Payer: Self-pay | Admitting: Podiatry

## 2014-11-07 ENCOUNTER — Ambulatory Visit (INDEPENDENT_AMBULATORY_CARE_PROVIDER_SITE_OTHER): Payer: Medicare Other | Admitting: Podiatry

## 2014-11-07 VITALS — BP 93/62 | HR 70 | Resp 15

## 2014-11-07 DIAGNOSIS — M779 Enthesopathy, unspecified: Secondary | ICD-10-CM

## 2014-11-07 DIAGNOSIS — L03032 Cellulitis of left toe: Secondary | ICD-10-CM | POA: Diagnosis not present

## 2014-11-07 DIAGNOSIS — L03012 Cellulitis of left finger: Secondary | ICD-10-CM

## 2014-11-09 NOTE — Progress Notes (Signed)
Subjective:     Patient ID: Bonnie Sanders, female   DOB: 1946/02/16, 69 y.o.   MRN: [id number]  HPI patient presents with a painful nail left hallux and also discoloration of the left fifth nail that she is concerned about area states that it's been bothering her for several months and does not remember specific injury   Review of Systems     Objective:   Physical Exam Neurovascular status intact with inflamed left fifth nail bed that is loose in the distal one half and also discoloration of the left fifth nail    Assessment:     Probable paronychia damaged left hallux and a discolored probable mycotic left fifth nail    Plan:     Reviewed both conditions and debrided the distal nailbed left fifth toe with some drainage that occurred and applied sterile dressing and instructed on soaks. I then went ahead and debrided the fifth nail which was tolerated well

## 2014-12-13 DIAGNOSIS — E039 Hypothyroidism, unspecified: Secondary | ICD-10-CM | POA: Diagnosis not present

## 2014-12-13 DIAGNOSIS — E782 Mixed hyperlipidemia: Secondary | ICD-10-CM | POA: Diagnosis not present

## 2014-12-13 DIAGNOSIS — K219 Gastro-esophageal reflux disease without esophagitis: Secondary | ICD-10-CM | POA: Diagnosis not present

## 2014-12-13 DIAGNOSIS — Z23 Encounter for immunization: Secondary | ICD-10-CM | POA: Diagnosis not present

## 2014-12-13 DIAGNOSIS — I10 Essential (primary) hypertension: Secondary | ICD-10-CM | POA: Diagnosis not present

## 2014-12-16 ENCOUNTER — Other Ambulatory Visit: Payer: Medicare Other

## 2014-12-21 ENCOUNTER — Ambulatory Visit: Payer: Medicare Other | Admitting: Podiatry

## 2015-01-02 ENCOUNTER — Encounter: Payer: Self-pay | Admitting: Podiatry

## 2015-01-02 ENCOUNTER — Ambulatory Visit (INDEPENDENT_AMBULATORY_CARE_PROVIDER_SITE_OTHER): Payer: Medicare Other | Admitting: Podiatry

## 2015-01-02 VITALS — BP 96/51 | HR 77 | Resp 16

## 2015-01-02 DIAGNOSIS — M79675 Pain in left toe(s): Secondary | ICD-10-CM

## 2015-01-02 DIAGNOSIS — L600 Ingrowing nail: Secondary | ICD-10-CM

## 2015-01-02 DIAGNOSIS — L603 Nail dystrophy: Secondary | ICD-10-CM

## 2015-01-02 MED ORDER — NEOMYCIN-POLYMYXIN-HC 3.5-10000-1 OT SOLN: OTIC | Status: DC

## 2015-01-02 NOTE — Patient Instructions (Signed)

## 2015-01-03 NOTE — Progress Notes (Signed)
Subjective:     Patient ID: Bonnie Sanders, female   DOB: Aug 30, 1945, 69 y.o.   MRN: [id number]  HPI patient presents stating I'm here to have my big toenail removed on my left foot and it really gets sore it's loose and I'm tired of it   Review of Systems     Objective:   Physical Exam Neurovascular status intact muscle strength adequate with severely thickened dystrophic left hallux nail but painful when pressed from a dorsal direction with looseness of the nailbed noted    Assessment:     Damaged left hallux nail secondary to long-term trauma with pain and deformity    Plan:     Reviewed condition and recommended nail removal. Patient wants procedure and I explained risk of procedure and recovery and at this time I infiltrated the left big toe 60 mg Xylocaine Marcaine mixture remove the hallux nail exposed matrix and applied phenol 5 applications 30 seconds followed by alcohol lavage and sterile dressing. They've instructions on soaks and reappoint

## 2015-01-25 DIAGNOSIS — E782 Mixed hyperlipidemia: Secondary | ICD-10-CM | POA: Diagnosis not present

## 2015-02-06 DIAGNOSIS — H04123 Dry eye syndrome of bilateral lacrimal glands: Secondary | ICD-10-CM | POA: Diagnosis not present

## 2015-02-06 DIAGNOSIS — H01001 Unspecified blepharitis right upper eyelid: Secondary | ICD-10-CM | POA: Diagnosis not present

## 2015-02-06 DIAGNOSIS — Z0100 Encounter for examination of eyes and vision without abnormal findings: Secondary | ICD-10-CM | POA: Diagnosis not present

## 2015-02-06 DIAGNOSIS — H26493 Other secondary cataract, bilateral: Secondary | ICD-10-CM | POA: Diagnosis not present

## 2015-04-07 ENCOUNTER — Other Ambulatory Visit: Payer: Self-pay

## 2015-04-07 DIAGNOSIS — Z1231 Encounter for screening mammogram for malignant neoplasm of breast: Secondary | ICD-10-CM

## 2015-04-11 DIAGNOSIS — Z23 Encounter for immunization: Secondary | ICD-10-CM | POA: Diagnosis not present

## 2015-04-11 DIAGNOSIS — I10 Essential (primary) hypertension: Secondary | ICD-10-CM | POA: Diagnosis not present

## 2015-04-11 DIAGNOSIS — F331 Major depressive disorder, recurrent, moderate: Secondary | ICD-10-CM | POA: Diagnosis not present

## 2015-04-11 DIAGNOSIS — J309 Allergic rhinitis, unspecified: Secondary | ICD-10-CM | POA: Diagnosis not present

## 2015-04-11 DIAGNOSIS — E782 Mixed hyperlipidemia: Secondary | ICD-10-CM | POA: Diagnosis not present

## 2015-05-12 ENCOUNTER — Ambulatory Visit: Payer: Medicare Other

## 2015-05-19 ENCOUNTER — Ambulatory Visit
Admission: RE | Admit: 2015-05-19 | Discharge: 2015-05-19 | Disposition: A | Discharge status: Home / Self Care | Payer: Medicare Other | Source: Ambulatory Visit

## 2015-05-19 DIAGNOSIS — Z1231 Encounter for screening mammogram for malignant neoplasm of breast: Secondary | ICD-10-CM

## 2015-05-23 DIAGNOSIS — E782 Mixed hyperlipidemia: Secondary | ICD-10-CM | POA: Diagnosis not present

## 2015-05-23 DIAGNOSIS — J309 Allergic rhinitis, unspecified: Secondary | ICD-10-CM | POA: Diagnosis not present

## 2015-05-23 DIAGNOSIS — I10 Essential (primary) hypertension: Secondary | ICD-10-CM | POA: Diagnosis not present

## 2015-07-04 DIAGNOSIS — H9191 Unspecified hearing loss, right ear: Secondary | ICD-10-CM | POA: Diagnosis not present

## 2015-07-04 DIAGNOSIS — H6123 Impacted cerumen, bilateral: Secondary | ICD-10-CM | POA: Diagnosis not present

## 2015-07-25 ENCOUNTER — Other Ambulatory Visit: Payer: Self-pay | Admitting: Orthopedic Surgery

## 2015-07-25 DIAGNOSIS — M25561 Pain in right knee: Secondary | ICD-10-CM | POA: Diagnosis not present

## 2015-07-25 DIAGNOSIS — M1712 Unilateral primary osteoarthritis, left knee: Secondary | ICD-10-CM | POA: Diagnosis not present

## 2015-07-25 DIAGNOSIS — M25562 Pain in left knee: Secondary | ICD-10-CM

## 2015-07-25 DIAGNOSIS — Z96651 Presence of right artificial knee joint: Secondary | ICD-10-CM | POA: Insufficient documentation

## 2015-07-25 DIAGNOSIS — R609 Edema, unspecified: Secondary | ICD-10-CM

## 2015-07-25 DIAGNOSIS — M1990 Unspecified osteoarthritis, unspecified site: Secondary | ICD-10-CM

## 2015-07-25 DIAGNOSIS — M25362 Other instability, left knee: Secondary | ICD-10-CM | POA: Diagnosis not present

## 2015-07-25 HISTORY — DX: Unilateral primary osteoarthritis, left knee: M17.12

## 2015-07-29 ENCOUNTER — Ambulatory Visit
Admission: RE | Admit: 2015-07-29 | Discharge: 2015-07-29 | Disposition: A | Discharge status: Home / Self Care | Payer: Medicare Other | Source: Ambulatory Visit | Attending: Orthopedic Surgery | Admitting: Orthopedic Surgery

## 2015-07-29 DIAGNOSIS — R609 Edema, unspecified: Secondary | ICD-10-CM

## 2015-07-29 DIAGNOSIS — M1990 Unspecified osteoarthritis, unspecified site: Secondary | ICD-10-CM

## 2015-07-29 DIAGNOSIS — M25562 Pain in left knee: Secondary | ICD-10-CM | POA: Diagnosis not present

## 2015-08-01 DIAGNOSIS — S83207D Unspecified tear of unspecified meniscus, current injury, left knee, subsequent encounter: Secondary | ICD-10-CM | POA: Diagnosis not present

## 2015-10-18 DIAGNOSIS — I10 Essential (primary) hypertension: Secondary | ICD-10-CM | POA: Diagnosis not present

## 2015-10-18 DIAGNOSIS — E039 Hypothyroidism, unspecified: Secondary | ICD-10-CM | POA: Diagnosis not present

## 2015-10-18 DIAGNOSIS — Z0000 Encounter for general adult medical examination without abnormal findings: Secondary | ICD-10-CM | POA: Diagnosis not present

## 2015-10-18 DIAGNOSIS — Z79899 Other long term (current) drug therapy: Secondary | ICD-10-CM | POA: Diagnosis not present

## 2015-10-18 DIAGNOSIS — E782 Mixed hyperlipidemia: Secondary | ICD-10-CM | POA: Diagnosis not present

## 2015-10-24 DIAGNOSIS — E039 Hypothyroidism, unspecified: Secondary | ICD-10-CM | POA: Diagnosis not present

## 2015-10-24 DIAGNOSIS — J309 Allergic rhinitis, unspecified: Secondary | ICD-10-CM | POA: Diagnosis not present

## 2015-10-24 DIAGNOSIS — Z0000 Encounter for general adult medical examination without abnormal findings: Secondary | ICD-10-CM | POA: Diagnosis not present

## 2015-10-24 DIAGNOSIS — E782 Mixed hyperlipidemia: Secondary | ICD-10-CM | POA: Diagnosis not present

## 2015-10-24 DIAGNOSIS — I10 Essential (primary) hypertension: Secondary | ICD-10-CM | POA: Diagnosis not present

## 2015-10-24 DIAGNOSIS — Z23 Encounter for immunization: Secondary | ICD-10-CM | POA: Diagnosis not present

## 2015-11-23 DIAGNOSIS — Z23 Encounter for immunization: Secondary | ICD-10-CM | POA: Diagnosis not present

## 2015-12-25 DIAGNOSIS — Z23 Encounter for immunization: Secondary | ICD-10-CM | POA: Diagnosis not present

## 2016-01-30 DIAGNOSIS — M1712 Unilateral primary osteoarthritis, left knee: Secondary | ICD-10-CM | POA: Diagnosis not present

## 2016-02-20 DIAGNOSIS — L298 Other pruritus: Secondary | ICD-10-CM | POA: Diagnosis not present

## 2016-04-29 DIAGNOSIS — K219 Gastro-esophageal reflux disease without esophagitis: Secondary | ICD-10-CM | POA: Diagnosis not present

## 2016-04-29 DIAGNOSIS — E039 Hypothyroidism, unspecified: Secondary | ICD-10-CM | POA: Diagnosis not present

## 2016-04-29 DIAGNOSIS — I10 Essential (primary) hypertension: Secondary | ICD-10-CM | POA: Diagnosis not present

## 2016-04-29 DIAGNOSIS — E782 Mixed hyperlipidemia: Secondary | ICD-10-CM | POA: Diagnosis not present

## 2016-04-29 DIAGNOSIS — Z23 Encounter for immunization: Secondary | ICD-10-CM | POA: Diagnosis not present

## 2016-05-27 DIAGNOSIS — H43813 Vitreous degeneration, bilateral: Secondary | ICD-10-CM | POA: Diagnosis not present

## 2016-05-27 DIAGNOSIS — H04123 Dry eye syndrome of bilateral lacrimal glands: Secondary | ICD-10-CM | POA: Diagnosis not present

## 2016-05-27 DIAGNOSIS — H26493 Other secondary cataract, bilateral: Secondary | ICD-10-CM | POA: Diagnosis not present

## 2016-05-27 DIAGNOSIS — H01001 Unspecified blepharitis right upper eyelid: Secondary | ICD-10-CM | POA: Diagnosis not present

## 2016-06-21 DIAGNOSIS — E039 Hypothyroidism, unspecified: Secondary | ICD-10-CM | POA: Diagnosis not present

## 2016-06-21 DIAGNOSIS — E559 Vitamin D deficiency, unspecified: Secondary | ICD-10-CM | POA: Diagnosis not present

## 2016-06-21 DIAGNOSIS — E782 Mixed hyperlipidemia: Secondary | ICD-10-CM | POA: Diagnosis not present

## 2016-06-24 DIAGNOSIS — E782 Mixed hyperlipidemia: Secondary | ICD-10-CM | POA: Diagnosis not present

## 2016-06-24 DIAGNOSIS — K219 Gastro-esophageal reflux disease without esophagitis: Secondary | ICD-10-CM | POA: Diagnosis not present

## 2016-06-24 DIAGNOSIS — I10 Essential (primary) hypertension: Secondary | ICD-10-CM | POA: Diagnosis not present

## 2016-06-24 DIAGNOSIS — E039 Hypothyroidism, unspecified: Secondary | ICD-10-CM | POA: Diagnosis not present

## 2016-08-26 DIAGNOSIS — E039 Hypothyroidism, unspecified: Secondary | ICD-10-CM | POA: Diagnosis not present

## 2016-08-26 DIAGNOSIS — E782 Mixed hyperlipidemia: Secondary | ICD-10-CM | POA: Diagnosis not present

## 2016-08-28 DIAGNOSIS — E039 Hypothyroidism, unspecified: Secondary | ICD-10-CM | POA: Diagnosis not present

## 2016-08-28 DIAGNOSIS — I10 Essential (primary) hypertension: Secondary | ICD-10-CM | POA: Diagnosis not present

## 2016-08-28 DIAGNOSIS — Z6822 Body mass index (BMI) 22.0-22.9, adult: Secondary | ICD-10-CM | POA: Diagnosis not present

## 2016-08-28 DIAGNOSIS — E782 Mixed hyperlipidemia: Secondary | ICD-10-CM | POA: Diagnosis not present

## 2016-09-17 ENCOUNTER — Encounter: Payer: Self-pay | Admitting: Podiatry

## 2016-09-17 ENCOUNTER — Ambulatory Visit (INDEPENDENT_AMBULATORY_CARE_PROVIDER_SITE_OTHER): Payer: Medicare Other

## 2016-09-17 ENCOUNTER — Ambulatory Visit (INDEPENDENT_AMBULATORY_CARE_PROVIDER_SITE_OTHER): Payer: Medicare Other | Admitting: Podiatry

## 2016-09-17 DIAGNOSIS — R52 Pain, unspecified: Secondary | ICD-10-CM | POA: Diagnosis not present

## 2016-09-17 DIAGNOSIS — M129 Arthropathy, unspecified: Secondary | ICD-10-CM

## 2016-09-17 MED ORDER — MELOXICAM 15 MG PO TABS: 15.0000 mg | ORAL_TABLET | Freq: Every day | ORAL | 0 refills | Status: DC

## 2016-09-17 NOTE — Progress Notes (Signed)
This patient presents the office with chief complaint of pain on the inside arch of her left foot. She says this been extremely painful and other times it is not noticeable when she wears her orthotics in her shoes. She relates that she does have extreme shooting pain through the top of her foot into her ankle from the site of pain in his her left foot. She denies any history of trauma or injury to the foot. She does say that she improves when she does wear her orthotics. She presents the office today for an evaluation and treatment of her painful left foot.  Patient does have a history of implant surgery in the big toe joint of the left foot  GENERAL APPEARANCE: Alert, conversant. Appropriately groomed. No acute distress.  VASCULAR: Pedal pulses are  palpable at  [facility] and [facility] bilateral.  Capillary refill time is immediate to all digits,  Normal temperature gradient.  Digital hair growth is present bilateral  NEUROLOGIC: sensation is normal to 5.07 monofilament at 5/5 sites bilateral.  Light touch is intact bilateral, Muscle strength normal.  MUSCULOSKELETAL: acceptable muscle strength, tone and stability bilateral.  Intrinsic muscluature intact bilateral.  Rectus appearance of foot and digits noted bilateral. Palpable pain noted on the dorsal medial and plantar aspect of the first [facility] left foot. Palpable dorsal exostosis noted at the level of the first [facility].  No evidence of any redness or swelling noted  DERMATOLOGIC: skin color, texture, and turgor are within normal limits.  No preulcerative lesions or ulcers  are seen, no interdigital maceration noted.  No open lesions present.  Digital nails are asymptomatic. No drainage noted.  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  ROV  x-rays were examined and they revealed no bony pathology around the first [facility] left foot. There is an implant present in the first MPJ of the left foot. There is a dorsal exostosis noted above the dorsal aspect of the implant.  Explained to the  patient that she is experiencing arthritis pain and recommended that she utilize her orthotics daily.  Prescribed multiple diabetic for her to take by mouth in an effort to relieve her pain. She is to return the office in 2 weeks at which time if the pain persists we will proceed with injection therapy   Helane GuntherGregory Doxie Augenstein DPM

## 2016-10-02 ENCOUNTER — Ambulatory Visit (INDEPENDENT_AMBULATORY_CARE_PROVIDER_SITE_OTHER): Payer: Medicare Other | Admitting: Podiatry

## 2016-10-02 ENCOUNTER — Encounter: Payer: Self-pay | Admitting: Podiatry

## 2016-10-02 DIAGNOSIS — M129 Arthropathy, unspecified: Secondary | ICD-10-CM

## 2016-10-02 NOTE — Progress Notes (Signed)
This patient presents the office for continued evaluation of the pain in her left foot. She was diagnosed with arthritis of the first [facility] left foot.  She was told to wear her orthotics daily and Mobic was prescribed for her to take one daily. She says that she is doing better and states that she is 99% improved and is pleased with her progress  GENERAL APPEARANCE: Alert, conversant. Appropriately groomed. No acute distress.  VASCULAR: Pedal pulses are  palpable at  [facility] bilateral.  Capillary refill time is immediate to all digits,  Normal temperature gradient.  Digital hair growth is present bilateral  NEUROLOGIC: sensation is normal to 5.07 monofilament at 5/5 sites bilateral.  Light touch is intact bilateral, Muscle strength normal.  MUSCULOSKELETAL: acceptable muscle strength, tone and stability bilateral.  Intrinsic muscluature intact bilateral.  Rectus appearance of foot and digits noted bilateral. Palpable pain noted on the dorsal medial and plantar aspect of the first [facility] left foot. Palpable dorsal exostosis noted at the level of the first [facility].  No evidence of any redness or swelling noted  DERMATOLOGIC: skin color, texture, and turgor are within normal limits.  No preulcerative lesions or ulcers  are seen, no interdigital maceration noted.  No open lesions present.  Digital nails are asymptomatic. No drainage noted.  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  ROV  .  No evidence of any pain noted left foot.  Patient was told to continue to wear her orthoses and take Mobic daily. Patient was told to return to the office when necessary   Helane GuntherGregory Korie Streat DPM

## 2016-10-14 ENCOUNTER — Other Ambulatory Visit: Payer: Self-pay | Admitting: Podiatry

## 2016-10-29 ENCOUNTER — Other Ambulatory Visit: Payer: Self-pay | Admitting: Family Medicine

## 2016-10-29 DIAGNOSIS — Z1231 Encounter for screening mammogram for malignant neoplasm of breast: Secondary | ICD-10-CM

## 2016-10-29 DIAGNOSIS — Z0000 Encounter for general adult medical examination without abnormal findings: Secondary | ICD-10-CM | POA: Diagnosis not present

## 2016-10-29 DIAGNOSIS — Z1211 Encounter for screening for malignant neoplasm of colon: Secondary | ICD-10-CM | POA: Diagnosis not present

## 2016-11-10 ENCOUNTER — Other Ambulatory Visit: Payer: Self-pay | Admitting: Podiatry

## 2016-11-12 NOTE — Telephone Encounter (Signed)
Pt needs an appt prior to future refills. 

## 2016-12-12 ENCOUNTER — Other Ambulatory Visit: Payer: Self-pay | Admitting: Podiatry

## 2016-12-12 DIAGNOSIS — Z23 Encounter for immunization: Secondary | ICD-10-CM | POA: Diagnosis not present

## 2017-01-13 ENCOUNTER — Ambulatory Visit (INDEPENDENT_AMBULATORY_CARE_PROVIDER_SITE_OTHER): Payer: Medicare Other

## 2017-01-13 ENCOUNTER — Encounter: Payer: Self-pay | Admitting: Podiatry

## 2017-01-13 ENCOUNTER — Ambulatory Visit (INDEPENDENT_AMBULATORY_CARE_PROVIDER_SITE_OTHER): Payer: Medicare Other | Admitting: Podiatry

## 2017-01-13 DIAGNOSIS — M779 Enthesopathy, unspecified: Secondary | ICD-10-CM

## 2017-01-13 DIAGNOSIS — M2042 Other hammer toe(s) (acquired), left foot: Secondary | ICD-10-CM

## 2017-01-13 DIAGNOSIS — L84 Corns and callosities: Secondary | ICD-10-CM

## 2017-01-13 MED ORDER — TRIAMCINOLONE ACETONIDE 10 MG/ML IJ SUSP
10.0000 mg | Freq: Once | INTRAMUSCULAR | Status: AC
  Administered 2017-01-13: 10 mg

## 2017-01-13 NOTE — Progress Notes (Signed)
Subjective:    Patient ID: Bonnie Sanders, female   DOB: 71 y.o.   MRN: [id number]   HPI patient presents with a lot of pain between the fourth and fifth toes left and states that she's tried over-the-counter medicine without relief and it's been ongoing and getting gradually worse with pain on both the outside and inside of the fifth    ROS      Objective:  Physical Exam neurovascular status intact negative Homans sign was noted with patient found to have interspace lesion with fluid buildup of the fourth metatarsophalangeal joint left and keratotic lesion with irritation around the fifth digit left     Assessment:    Inflammatory capsulitis of the fourth interspace left foot and lesion formation with pain left fifth digit lateral side with digital hammertoe deformity     Plan:   H&P conditions reviewed and proximal nerve block administered left. I injected the interspace with 3 mg dexamethasone Kenalog and I went ahead debris did lesion on the fourth interspace outside the fifth toe and discussed possibility for partial soft tissue syndactylization procedure eventually. Educated her on the surgery and what would be required  X-rays indicated that there is mild hypertrophy with rotation of the head of proximal phalanx fifth digit left

## 2017-01-28 DIAGNOSIS — I10 Essential (primary) hypertension: Secondary | ICD-10-CM | POA: Diagnosis not present

## 2017-01-28 DIAGNOSIS — R159 Full incontinence of feces: Secondary | ICD-10-CM | POA: Diagnosis not present

## 2017-01-28 DIAGNOSIS — K640 First degree hemorrhoids: Secondary | ICD-10-CM | POA: Insufficient documentation

## 2017-01-28 HISTORY — DX: Full incontinence of feces: R15.9

## 2017-03-06 ENCOUNTER — Ambulatory Visit
Admission: RE | Admit: 2017-03-06 | Discharge: 2017-03-06 | Disposition: A | Discharge status: Home / Self Care | Payer: Medicare Other | Source: Ambulatory Visit | Attending: Family Medicine | Admitting: Family Medicine

## 2017-03-06 ENCOUNTER — Other Ambulatory Visit: Payer: Self-pay | Admitting: Family Medicine

## 2017-03-06 DIAGNOSIS — R202 Paresthesia of skin: Secondary | ICD-10-CM | POA: Diagnosis not present

## 2017-03-06 DIAGNOSIS — M5412 Radiculopathy, cervical region: Secondary | ICD-10-CM | POA: Diagnosis not present

## 2017-03-06 DIAGNOSIS — E539 Vitamin B deficiency, unspecified: Secondary | ICD-10-CM | POA: Diagnosis not present

## 2017-03-06 DIAGNOSIS — M4802 Spinal stenosis, cervical region: Secondary | ICD-10-CM | POA: Diagnosis not present

## 2017-03-06 DIAGNOSIS — R200 Anesthesia of skin: Secondary | ICD-10-CM | POA: Diagnosis not present

## 2017-03-13 DIAGNOSIS — R202 Paresthesia of skin: Secondary | ICD-10-CM | POA: Diagnosis not present

## 2017-03-13 DIAGNOSIS — E538 Deficiency of other specified B group vitamins: Secondary | ICD-10-CM | POA: Diagnosis not present

## 2017-03-13 DIAGNOSIS — M19042 Primary osteoarthritis, left hand: Secondary | ICD-10-CM | POA: Diagnosis not present

## 2017-03-13 DIAGNOSIS — M19041 Primary osteoarthritis, right hand: Secondary | ICD-10-CM | POA: Diagnosis not present

## 2017-04-02 DIAGNOSIS — Z6822 Body mass index (BMI) 22.0-22.9, adult: Secondary | ICD-10-CM | POA: Diagnosis not present

## 2017-04-02 DIAGNOSIS — H6123 Impacted cerumen, bilateral: Secondary | ICD-10-CM | POA: Diagnosis not present

## 2017-04-02 DIAGNOSIS — J329 Chronic sinusitis, unspecified: Secondary | ICD-10-CM | POA: Diagnosis not present

## 2017-04-02 DIAGNOSIS — J069 Acute upper respiratory infection, unspecified: Secondary | ICD-10-CM | POA: Diagnosis not present

## 2017-04-03 DIAGNOSIS — G5602 Carpal tunnel syndrome, left upper limb: Secondary | ICD-10-CM | POA: Diagnosis not present

## 2017-04-03 DIAGNOSIS — Z6822 Body mass index (BMI) 22.0-22.9, adult: Secondary | ICD-10-CM | POA: Diagnosis not present

## 2017-04-03 DIAGNOSIS — M542 Cervicalgia: Secondary | ICD-10-CM | POA: Diagnosis not present

## 2017-04-03 DIAGNOSIS — G5601 Carpal tunnel syndrome, right upper limb: Secondary | ICD-10-CM | POA: Diagnosis not present

## 2017-04-03 DIAGNOSIS — M4692 Unspecified inflammatory spondylopathy, cervical region: Secondary | ICD-10-CM | POA: Diagnosis not present

## 2017-04-24 DIAGNOSIS — I10 Essential (primary) hypertension: Secondary | ICD-10-CM | POA: Diagnosis not present

## 2017-04-24 DIAGNOSIS — Z6822 Body mass index (BMI) 22.0-22.9, adult: Secondary | ICD-10-CM | POA: Diagnosis not present

## 2017-04-24 DIAGNOSIS — J309 Allergic rhinitis, unspecified: Secondary | ICD-10-CM | POA: Diagnosis not present

## 2017-04-24 DIAGNOSIS — E782 Mixed hyperlipidemia: Secondary | ICD-10-CM | POA: Diagnosis not present

## 2017-05-19 ENCOUNTER — Ambulatory Visit
Admission: RE | Admit: 2017-05-19 | Discharge: 2017-05-19 | Disposition: A | Discharge status: Home / Self Care | Payer: Medicare Other | Source: Ambulatory Visit | Attending: Family Medicine | Admitting: Family Medicine

## 2017-05-19 DIAGNOSIS — Z1231 Encounter for screening mammogram for malignant neoplasm of breast: Secondary | ICD-10-CM | POA: Diagnosis not present

## 2017-06-16 DIAGNOSIS — E538 Deficiency of other specified B group vitamins: Secondary | ICD-10-CM | POA: Diagnosis not present

## 2017-06-16 DIAGNOSIS — E039 Hypothyroidism, unspecified: Secondary | ICD-10-CM | POA: Diagnosis not present

## 2017-06-18 DIAGNOSIS — Z6822 Body mass index (BMI) 22.0-22.9, adult: Secondary | ICD-10-CM | POA: Diagnosis not present

## 2017-06-18 DIAGNOSIS — E538 Deficiency of other specified B group vitamins: Secondary | ICD-10-CM | POA: Diagnosis not present

## 2017-06-18 DIAGNOSIS — E039 Hypothyroidism, unspecified: Secondary | ICD-10-CM | POA: Diagnosis not present

## 2017-06-18 DIAGNOSIS — K219 Gastro-esophageal reflux disease without esophagitis: Secondary | ICD-10-CM | POA: Diagnosis not present

## 2017-06-18 DIAGNOSIS — H04123 Dry eye syndrome of bilateral lacrimal glands: Secondary | ICD-10-CM | POA: Diagnosis not present

## 2017-07-22 DIAGNOSIS — H04123 Dry eye syndrome of bilateral lacrimal glands: Secondary | ICD-10-CM | POA: Diagnosis not present

## 2017-07-22 DIAGNOSIS — H26493 Other secondary cataract, bilateral: Secondary | ICD-10-CM | POA: Diagnosis not present

## 2017-07-22 DIAGNOSIS — H43813 Vitreous degeneration, bilateral: Secondary | ICD-10-CM | POA: Diagnosis not present

## 2017-08-15 DIAGNOSIS — E782 Mixed hyperlipidemia: Secondary | ICD-10-CM | POA: Diagnosis not present

## 2017-08-18 DIAGNOSIS — K219 Gastro-esophageal reflux disease without esophagitis: Secondary | ICD-10-CM | POA: Diagnosis not present

## 2017-08-18 DIAGNOSIS — Z6822 Body mass index (BMI) 22.0-22.9, adult: Secondary | ICD-10-CM | POA: Diagnosis not present

## 2017-08-18 DIAGNOSIS — E782 Mixed hyperlipidemia: Secondary | ICD-10-CM | POA: Diagnosis not present

## 2017-08-18 DIAGNOSIS — F331 Major depressive disorder, recurrent, moderate: Secondary | ICD-10-CM | POA: Diagnosis not present

## 2017-08-18 DIAGNOSIS — G4700 Insomnia, unspecified: Secondary | ICD-10-CM | POA: Diagnosis not present

## 2017-08-18 DIAGNOSIS — H04123 Dry eye syndrome of bilateral lacrimal glands: Secondary | ICD-10-CM | POA: Diagnosis not present

## 2017-09-14 DIAGNOSIS — I10 Essential (primary) hypertension: Secondary | ICD-10-CM | POA: Diagnosis not present

## 2017-09-14 DIAGNOSIS — M79672 Pain in left foot: Secondary | ICD-10-CM | POA: Diagnosis not present

## 2017-09-14 DIAGNOSIS — S99922A Unspecified injury of left foot, initial encounter: Secondary | ICD-10-CM | POA: Diagnosis not present

## 2017-09-15 ENCOUNTER — Encounter: Payer: Self-pay | Admitting: Podiatry

## 2017-09-15 ENCOUNTER — Ambulatory Visit (INDEPENDENT_AMBULATORY_CARE_PROVIDER_SITE_OTHER): Payer: Medicare Other | Admitting: Podiatry

## 2017-09-15 DIAGNOSIS — L600 Ingrowing nail: Secondary | ICD-10-CM

## 2017-09-15 DIAGNOSIS — B351 Tinea unguium: Secondary | ICD-10-CM | POA: Diagnosis not present

## 2017-09-15 NOTE — Patient Instructions (Signed)

## 2017-09-15 NOTE — Progress Notes (Signed)
Subjective:   Patient ID: Bonnie DemarkShelley N Gwynne, female   DOB: 72 y.o.   MRN: [id number]   HPI Patient presents stating that her left hallux nail was traumatized and is lifted and is partially on partially off and it was bleeding underneath and she is worried about infection.  Patient does not smoke and is active   Review of Systems  All other systems reviewed and are negative.       Objective:  Physical Exam  Constitutional: She appears well-developed and well-nourished.  Cardiovascular: Intact distal pulses.  Pulmonary/Chest: Effort normal.  Musculoskeletal: Normal range of motion.  Neurological: She is alert.  Skin: Skin is warm.  Nursing note and vitals reviewed.   Neurovascular status intact muscle strength is adequate range of motion within normal limits with patient noted to have a traumatized left hallux nail that is partially loosened from the bed with subungual bleeding localized with no proximal edema erythema or drainage noted.  Patient is found to have good digital perfusion well oriented x3     Assessment:  Severe trauma to the left hallux nail bed with lifting of the nail and traumatized type tissue     Plan:  H&P condition reviewed and recommended removal of the nail flushing out of the bed.  I infiltrated 60 mg like Marcaine mixture under sterile conditions I removed the hallux nail and flushed the bed out and applied sterile dressing and patient will be seen back for us to recheck

## 2017-11-03 DIAGNOSIS — Z0000 Encounter for general adult medical examination without abnormal findings: Secondary | ICD-10-CM | POA: Diagnosis not present

## 2017-11-03 DIAGNOSIS — Z1211 Encounter for screening for malignant neoplasm of colon: Secondary | ICD-10-CM | POA: Diagnosis not present

## 2017-11-03 DIAGNOSIS — H6122 Impacted cerumen, left ear: Secondary | ICD-10-CM | POA: Diagnosis not present

## 2017-11-03 DIAGNOSIS — Z6822 Body mass index (BMI) 22.0-22.9, adult: Secondary | ICD-10-CM | POA: Diagnosis not present

## 2017-11-06 ENCOUNTER — Other Ambulatory Visit: Payer: Self-pay

## 2017-12-25 DIAGNOSIS — M1712 Unilateral primary osteoarthritis, left knee: Secondary | ICD-10-CM | POA: Diagnosis not present

## 2017-12-30 DIAGNOSIS — Z23 Encounter for immunization: Secondary | ICD-10-CM | POA: Diagnosis not present

## 2018-01-09 DIAGNOSIS — E039 Hypothyroidism, unspecified: Secondary | ICD-10-CM | POA: Diagnosis not present

## 2018-01-13 DIAGNOSIS — G4700 Insomnia, unspecified: Secondary | ICD-10-CM | POA: Diagnosis not present

## 2018-01-13 DIAGNOSIS — Z6822 Body mass index (BMI) 22.0-22.9, adult: Secondary | ICD-10-CM | POA: Diagnosis not present

## 2018-01-13 DIAGNOSIS — E039 Hypothyroidism, unspecified: Secondary | ICD-10-CM | POA: Diagnosis not present

## 2018-01-13 DIAGNOSIS — L708 Other acne: Secondary | ICD-10-CM | POA: Diagnosis not present

## 2018-03-17 IMAGING — CR DG CERVICAL SPINE COMPLETE 4+V
6 series · 6 of 6 positions shown · non-contrast
Comparison: None.

CLINICAL DATA: Left hand paresthesia

EXAM:
CERVICAL SPINE - COMPLETE 4+ VIEW

[w cervical spine lat]
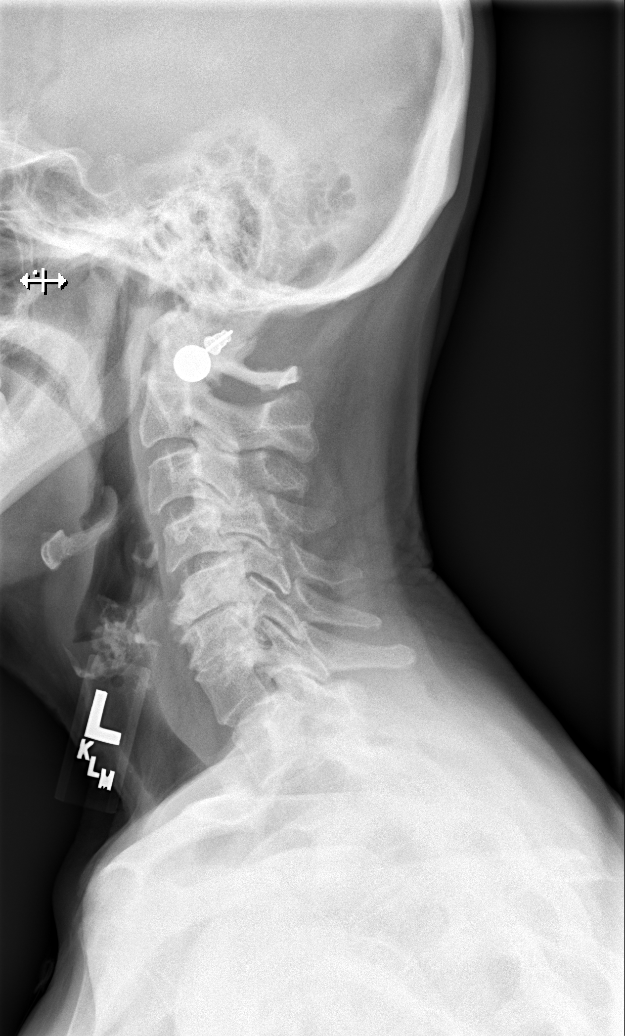

[w cervical spine ap_obl (1 of 2)]
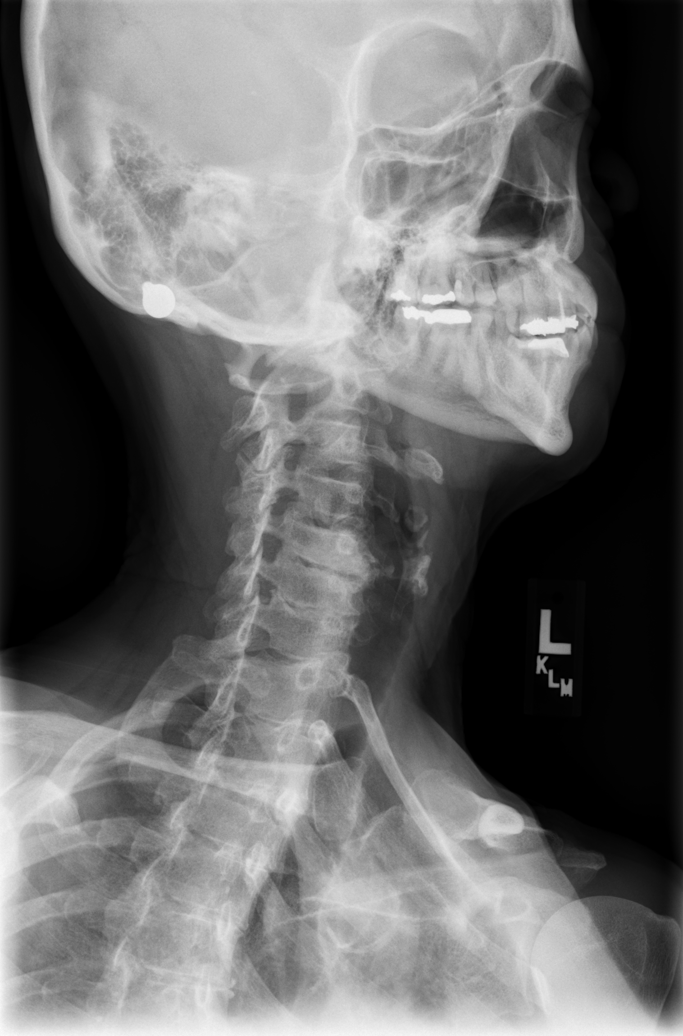

[w cervical spine ap_obl (2 of 2)]
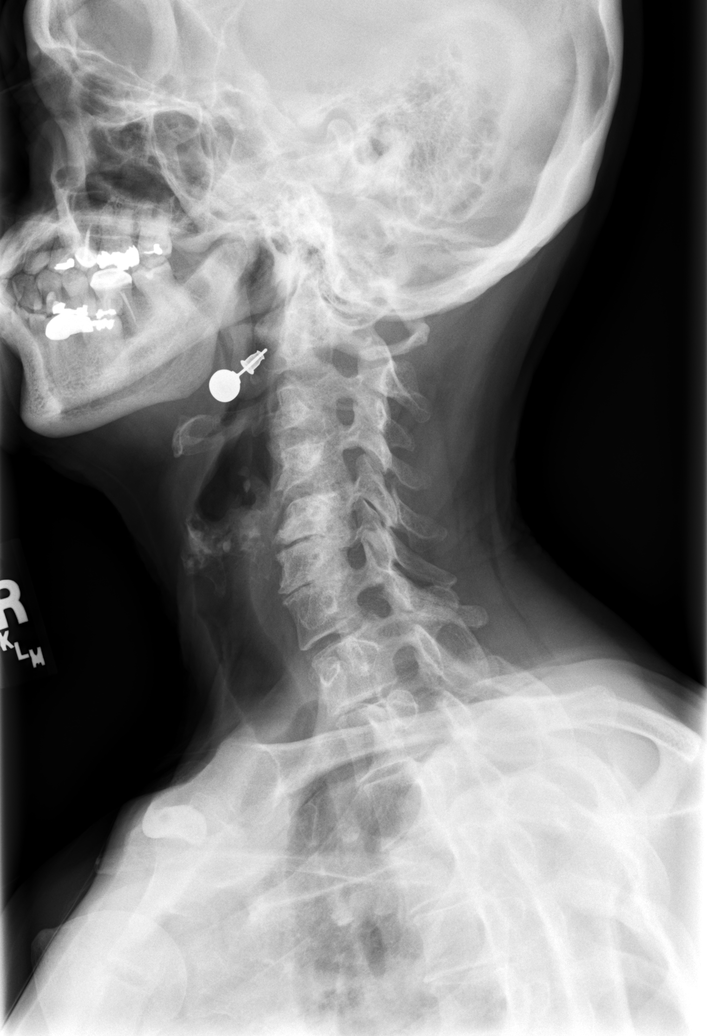

[w cervical spine ap]
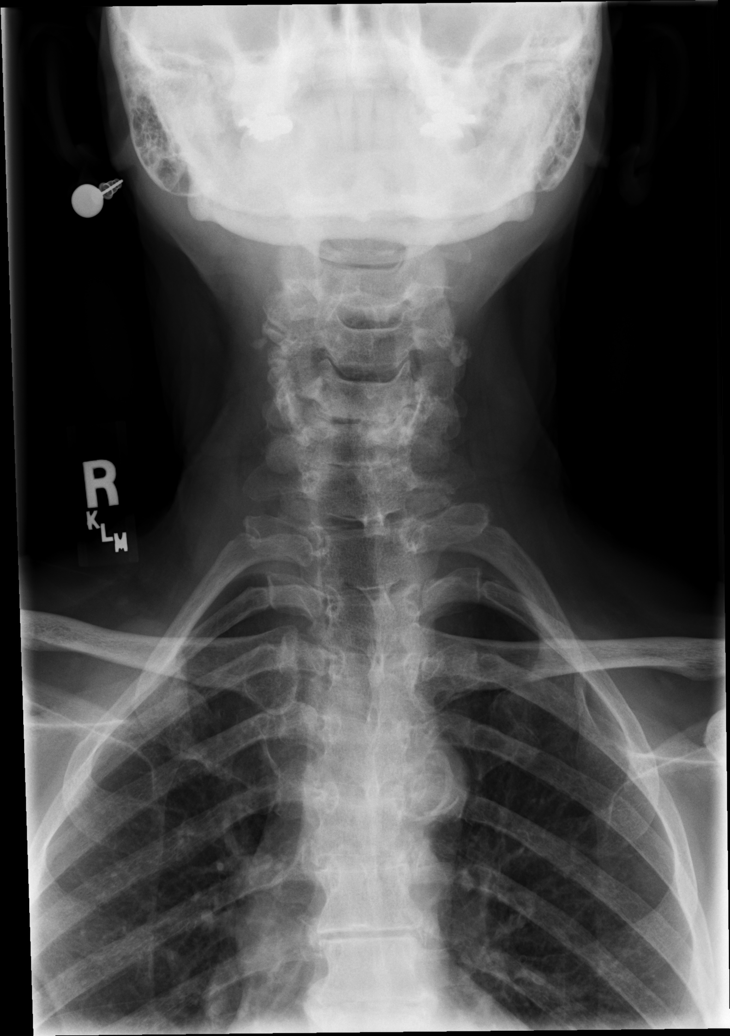

[w cervical spine odontoid (1 of 2)]
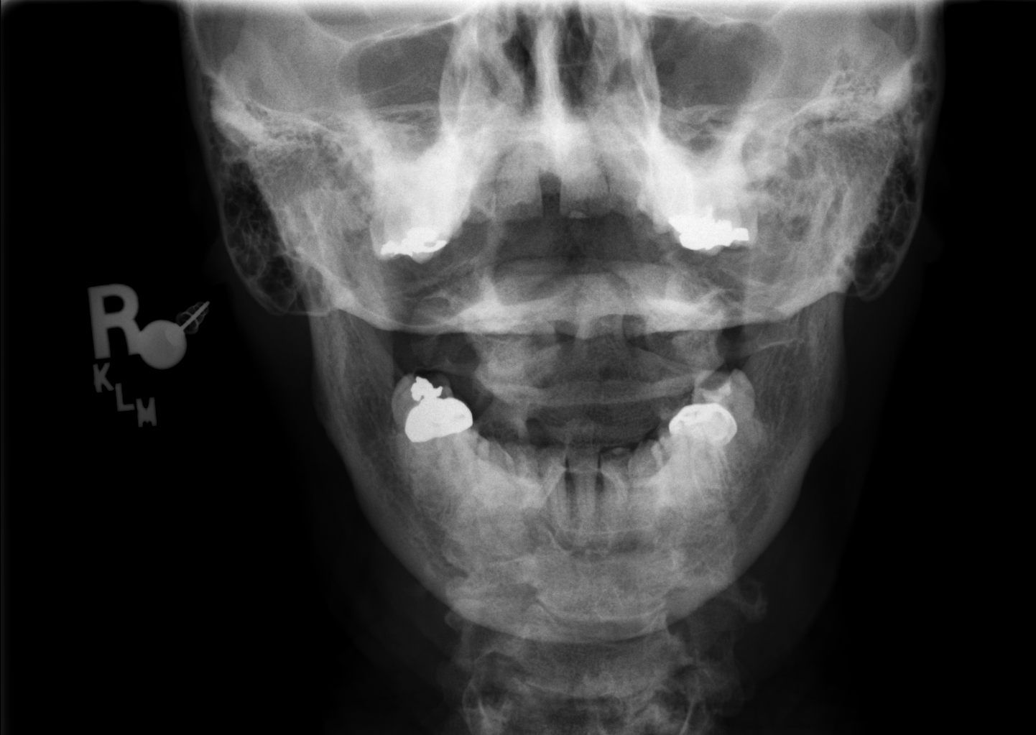

[w cervical spine odontoid (2 of 2)]
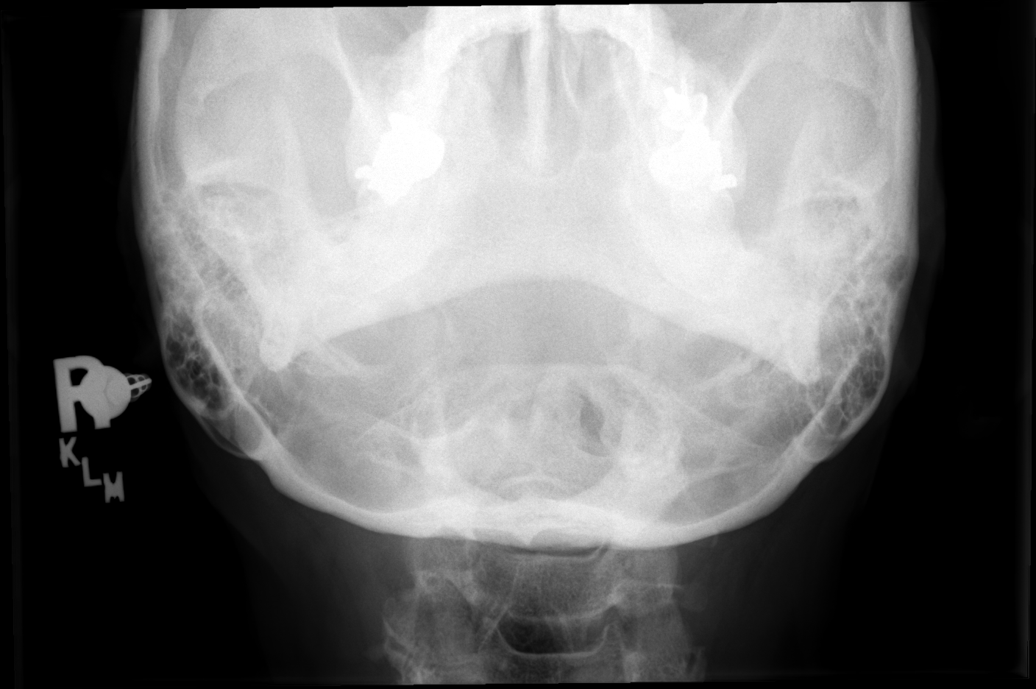

[6 of 6 positions shown; findings below may reference images not displayed]

FINDINGS: Mild anterolisthesis C3-4 and C4-5 . Mild retrolisthesis C5-6 and
C6-7. Negative for fracture or mass

Advanced disc degeneration and spondylosis C5-6 and C6-7. Facet
degeneration at C4-5.

Mild right foraminal narrowing at C3-4, C4-5, C6-7.

Mild left foraminal narrowing C4-5 and C6-7. Severe left foraminal
narrowing C5-6 due to spurring.

Carotid artery calcification bilaterally.
IMPRESSION: Multilevel degenerative changes in the cervical spine most severe at
C5-6. Severe left foraminal narrowing C5-6 due to spurring.

## 2018-05-08 DIAGNOSIS — E039 Hypothyroidism, unspecified: Secondary | ICD-10-CM | POA: Diagnosis not present

## 2018-05-08 DIAGNOSIS — Z79899 Other long term (current) drug therapy: Secondary | ICD-10-CM | POA: Diagnosis not present

## 2018-05-08 DIAGNOSIS — E538 Deficiency of other specified B group vitamins: Secondary | ICD-10-CM | POA: Diagnosis not present

## 2018-05-08 DIAGNOSIS — E559 Vitamin D deficiency, unspecified: Secondary | ICD-10-CM | POA: Diagnosis not present

## 2018-05-08 DIAGNOSIS — I10 Essential (primary) hypertension: Secondary | ICD-10-CM | POA: Diagnosis not present

## 2018-05-08 DIAGNOSIS — E782 Mixed hyperlipidemia: Secondary | ICD-10-CM | POA: Diagnosis not present

## 2018-05-18 DIAGNOSIS — E538 Deficiency of other specified B group vitamins: Secondary | ICD-10-CM | POA: Diagnosis not present

## 2018-05-18 DIAGNOSIS — K219 Gastro-esophageal reflux disease without esophagitis: Secondary | ICD-10-CM | POA: Diagnosis not present

## 2018-05-18 DIAGNOSIS — Z6821 Body mass index (BMI) 21.0-21.9, adult: Secondary | ICD-10-CM | POA: Diagnosis not present

## 2018-05-18 DIAGNOSIS — I10 Essential (primary) hypertension: Secondary | ICD-10-CM | POA: Diagnosis not present

## 2018-05-18 DIAGNOSIS — E782 Mixed hyperlipidemia: Secondary | ICD-10-CM | POA: Diagnosis not present

## 2018-08-10 DIAGNOSIS — E538 Deficiency of other specified B group vitamins: Secondary | ICD-10-CM | POA: Diagnosis not present

## 2018-08-10 DIAGNOSIS — E559 Vitamin D deficiency, unspecified: Secondary | ICD-10-CM | POA: Diagnosis not present

## 2018-08-10 DIAGNOSIS — E782 Mixed hyperlipidemia: Secondary | ICD-10-CM | POA: Diagnosis not present

## 2018-08-10 DIAGNOSIS — E039 Hypothyroidism, unspecified: Secondary | ICD-10-CM | POA: Diagnosis not present

## 2018-08-19 DIAGNOSIS — E039 Hypothyroidism, unspecified: Secondary | ICD-10-CM | POA: Diagnosis not present

## 2018-08-19 DIAGNOSIS — E559 Vitamin D deficiency, unspecified: Secondary | ICD-10-CM | POA: Diagnosis not present

## 2018-08-19 DIAGNOSIS — E782 Mixed hyperlipidemia: Secondary | ICD-10-CM | POA: Diagnosis not present

## 2018-08-19 DIAGNOSIS — K219 Gastro-esophageal reflux disease without esophagitis: Secondary | ICD-10-CM | POA: Diagnosis not present

## 2018-08-19 DIAGNOSIS — Z6820 Body mass index (BMI) 20.0-20.9, adult: Secondary | ICD-10-CM | POA: Diagnosis not present

## 2018-08-19 DIAGNOSIS — E538 Deficiency of other specified B group vitamins: Secondary | ICD-10-CM | POA: Diagnosis not present

## 2018-08-19 DIAGNOSIS — G4700 Insomnia, unspecified: Secondary | ICD-10-CM | POA: Diagnosis not present

## 2018-09-09 DIAGNOSIS — I10 Essential (primary) hypertension: Secondary | ICD-10-CM | POA: Diagnosis not present

## 2018-09-09 DIAGNOSIS — G4700 Insomnia, unspecified: Secondary | ICD-10-CM | POA: Diagnosis not present

## 2018-09-09 DIAGNOSIS — E039 Hypothyroidism, unspecified: Secondary | ICD-10-CM | POA: Diagnosis not present

## 2018-10-08 DIAGNOSIS — I10 Essential (primary) hypertension: Secondary | ICD-10-CM | POA: Diagnosis not present

## 2018-10-08 DIAGNOSIS — E538 Deficiency of other specified B group vitamins: Secondary | ICD-10-CM | POA: Diagnosis not present

## 2018-10-08 DIAGNOSIS — Z79899 Other long term (current) drug therapy: Secondary | ICD-10-CM | POA: Diagnosis not present

## 2018-10-08 DIAGNOSIS — E782 Mixed hyperlipidemia: Secondary | ICD-10-CM | POA: Diagnosis not present

## 2018-10-08 DIAGNOSIS — E039 Hypothyroidism, unspecified: Secondary | ICD-10-CM | POA: Diagnosis not present

## 2018-10-20 DIAGNOSIS — L821 Other seborrheic keratosis: Secondary | ICD-10-CM | POA: Diagnosis not present

## 2018-10-20 DIAGNOSIS — L820 Inflamed seborrheic keratosis: Secondary | ICD-10-CM | POA: Diagnosis not present

## 2018-10-21 DIAGNOSIS — H04123 Dry eye syndrome of bilateral lacrimal glands: Secondary | ICD-10-CM | POA: Diagnosis not present

## 2018-10-21 DIAGNOSIS — H524 Presbyopia: Secondary | ICD-10-CM | POA: Diagnosis not present

## 2018-10-21 DIAGNOSIS — H26493 Other secondary cataract, bilateral: Secondary | ICD-10-CM | POA: Diagnosis not present

## 2018-10-21 DIAGNOSIS — H5212 Myopia, left eye: Secondary | ICD-10-CM | POA: Diagnosis not present

## 2018-10-23 DIAGNOSIS — E039 Hypothyroidism, unspecified: Secondary | ICD-10-CM | POA: Diagnosis not present

## 2018-10-23 DIAGNOSIS — E782 Mixed hyperlipidemia: Secondary | ICD-10-CM | POA: Diagnosis not present

## 2018-10-23 DIAGNOSIS — I10 Essential (primary) hypertension: Secondary | ICD-10-CM | POA: Diagnosis not present

## 2018-11-06 DIAGNOSIS — Z0000 Encounter for general adult medical examination without abnormal findings: Secondary | ICD-10-CM | POA: Diagnosis not present

## 2018-11-15 DIAGNOSIS — E782 Mixed hyperlipidemia: Secondary | ICD-10-CM | POA: Diagnosis not present

## 2018-12-07 DIAGNOSIS — Z23 Encounter for immunization: Secondary | ICD-10-CM | POA: Diagnosis not present

## 2019-01-18 DIAGNOSIS — E538 Deficiency of other specified B group vitamins: Secondary | ICD-10-CM | POA: Diagnosis not present

## 2019-01-18 DIAGNOSIS — I10 Essential (primary) hypertension: Secondary | ICD-10-CM | POA: Diagnosis not present

## 2019-01-18 DIAGNOSIS — E782 Mixed hyperlipidemia: Secondary | ICD-10-CM | POA: Diagnosis not present

## 2019-01-18 DIAGNOSIS — E039 Hypothyroidism, unspecified: Secondary | ICD-10-CM | POA: Diagnosis not present

## 2019-01-18 DIAGNOSIS — E559 Vitamin D deficiency, unspecified: Secondary | ICD-10-CM | POA: Diagnosis not present

## 2019-01-22 DIAGNOSIS — E782 Mixed hyperlipidemia: Secondary | ICD-10-CM | POA: Diagnosis not present

## 2019-01-22 DIAGNOSIS — I10 Essential (primary) hypertension: Secondary | ICD-10-CM | POA: Diagnosis not present

## 2019-01-22 DIAGNOSIS — K219 Gastro-esophageal reflux disease without esophagitis: Secondary | ICD-10-CM | POA: Diagnosis not present

## 2019-01-22 DIAGNOSIS — E559 Vitamin D deficiency, unspecified: Secondary | ICD-10-CM | POA: Diagnosis not present

## 2019-01-22 DIAGNOSIS — E039 Hypothyroidism, unspecified: Secondary | ICD-10-CM | POA: Diagnosis not present

## 2019-04-21 DIAGNOSIS — E782 Mixed hyperlipidemia: Secondary | ICD-10-CM | POA: Diagnosis not present

## 2019-04-21 DIAGNOSIS — Z79899 Other long term (current) drug therapy: Secondary | ICD-10-CM | POA: Diagnosis not present

## 2019-04-21 DIAGNOSIS — I10 Essential (primary) hypertension: Secondary | ICD-10-CM | POA: Diagnosis not present

## 2019-04-21 DIAGNOSIS — E039 Hypothyroidism, unspecified: Secondary | ICD-10-CM | POA: Diagnosis not present

## 2019-04-21 DIAGNOSIS — E538 Deficiency of other specified B group vitamins: Secondary | ICD-10-CM | POA: Diagnosis not present

## 2019-04-21 DIAGNOSIS — D649 Anemia, unspecified: Secondary | ICD-10-CM | POA: Diagnosis not present

## 2019-04-28 DIAGNOSIS — G4700 Insomnia, unspecified: Secondary | ICD-10-CM | POA: Diagnosis not present

## 2019-04-28 DIAGNOSIS — I10 Essential (primary) hypertension: Secondary | ICD-10-CM | POA: Diagnosis not present

## 2019-04-28 DIAGNOSIS — K219 Gastro-esophageal reflux disease without esophagitis: Secondary | ICD-10-CM | POA: Diagnosis not present

## 2019-04-28 DIAGNOSIS — J309 Allergic rhinitis, unspecified: Secondary | ICD-10-CM | POA: Diagnosis not present

## 2019-06-01 DIAGNOSIS — E782 Mixed hyperlipidemia: Secondary | ICD-10-CM | POA: Diagnosis not present

## 2019-06-01 DIAGNOSIS — E039 Hypothyroidism, unspecified: Secondary | ICD-10-CM | POA: Diagnosis not present

## 2019-06-01 DIAGNOSIS — D649 Anemia, unspecified: Secondary | ICD-10-CM | POA: Diagnosis not present

## 2019-06-01 DIAGNOSIS — K219 Gastro-esophageal reflux disease without esophagitis: Secondary | ICD-10-CM | POA: Diagnosis not present

## 2019-06-10 ENCOUNTER — Ambulatory Visit: Payer: Medicare Other | Attending: Internal Medicine

## 2019-06-10 DIAGNOSIS — Z23 Encounter for immunization: Secondary | ICD-10-CM | POA: Insufficient documentation

## 2019-06-10 NOTE — Progress Notes (Signed)
   Covid-19 Vaccination Clinic  Name:  MALEYA LEEVER    MRN: [id number] DOB: [birth year]  06/10/2019  Ms. Friel was observed post Covid-19 immunization for 15 minutes without incident. She was provided with Vaccine Information Sheet and instruction to access the V-Safe system.   Ms. Prisk was instructed to call 911 with any severe reactions post vaccine: Marland Kitchen Difficulty breathing  . Swelling of face and throat  . A fast heartbeat  . A bad rash all over body  . Dizziness and weakness   Immunizations Administered    Name Date Dose VIS Date Route   Pfizer COVID-19 Vaccine 06/10/2019  8:45 AM 0.3 mL 03/19/2019 Intramuscular   Manufacturer: [employer], [employer]   Lot: [id number]   NDC: [address]-7

## 2019-07-07 ENCOUNTER — Ambulatory Visit: Payer: Medicare Other | Attending: Internal Medicine

## 2019-07-07 DIAGNOSIS — Z23 Encounter for immunization: Secondary | ICD-10-CM

## 2019-07-07 NOTE — Progress Notes (Signed)
   Covid-19 Vaccination Clinic  Name:  Bonnie Sanders    MRN: [id number] DOB: [birth year]  07/07/2019  [name] was observed post Covid-19 immunization for 15 minutes without incident. She was provided with Vaccine Information Sheet and instruction to access the V-Safe system.   [name] was instructed to call 911 with any severe reactions post vaccine: Marland Kitchen Difficulty breathing  . Swelling of face and throat  . A fast heartbeat  . A bad rash all over body  . Dizziness and weakness   Immunizations Administered    Name Date Dose VIS Date Route   Pfizer COVID-19 Vaccine 07/07/2019  8:22 AM 0.3 mL 03/19/2019 Intramuscular   Manufacturer: [employer], [employer]   Lot: [id number]   NDC: [address]-5

## 2019-07-16 DIAGNOSIS — H26492 Other secondary cataract, left eye: Secondary | ICD-10-CM | POA: Diagnosis not present

## 2019-08-05 DIAGNOSIS — H26492 Other secondary cataract, left eye: Secondary | ICD-10-CM | POA: Diagnosis not present

## 2019-08-19 DIAGNOSIS — E039 Hypothyroidism, unspecified: Secondary | ICD-10-CM | POA: Diagnosis not present

## 2019-08-19 DIAGNOSIS — E782 Mixed hyperlipidemia: Secondary | ICD-10-CM | POA: Diagnosis not present

## 2019-12-22 DIAGNOSIS — Z23 Encounter for immunization: Secondary | ICD-10-CM | POA: Diagnosis not present

## 2019-12-23 DIAGNOSIS — Z96651 Presence of right artificial knee joint: Secondary | ICD-10-CM | POA: Diagnosis not present

## 2019-12-23 DIAGNOSIS — M1712 Unilateral primary osteoarthritis, left knee: Secondary | ICD-10-CM | POA: Diagnosis not present

## 2020-01-07 DIAGNOSIS — Z23 Encounter for immunization: Secondary | ICD-10-CM | POA: Diagnosis not present

## 2020-01-26 DIAGNOSIS — I10 Essential (primary) hypertension: Secondary | ICD-10-CM | POA: Diagnosis not present

## 2020-01-26 DIAGNOSIS — E782 Mixed hyperlipidemia: Secondary | ICD-10-CM | POA: Diagnosis not present

## 2020-02-10 DIAGNOSIS — M1712 Unilateral primary osteoarthritis, left knee: Secondary | ICD-10-CM | POA: Diagnosis not present

## 2020-02-10 DIAGNOSIS — Z96651 Presence of right artificial knee joint: Secondary | ICD-10-CM | POA: Diagnosis not present

## 2020-02-22 DIAGNOSIS — M25562 Pain in left knee: Secondary | ICD-10-CM | POA: Diagnosis not present

## 2020-02-22 DIAGNOSIS — G8929 Other chronic pain: Secondary | ICD-10-CM | POA: Diagnosis not present

## 2020-02-22 DIAGNOSIS — M25561 Pain in right knee: Secondary | ICD-10-CM | POA: Diagnosis not present

## 2020-04-19 DIAGNOSIS — M7541 Impingement syndrome of right shoulder: Secondary | ICD-10-CM | POA: Diagnosis not present

## 2020-04-19 DIAGNOSIS — M25511 Pain in right shoulder: Secondary | ICD-10-CM | POA: Diagnosis not present

## 2020-04-19 DIAGNOSIS — H6123 Impacted cerumen, bilateral: Secondary | ICD-10-CM | POA: Diagnosis not present

## 2020-04-19 DIAGNOSIS — I10 Essential (primary) hypertension: Secondary | ICD-10-CM | POA: Diagnosis not present

## 2020-05-19 DIAGNOSIS — E039 Hypothyroidism, unspecified: Secondary | ICD-10-CM | POA: Diagnosis not present

## 2020-05-19 DIAGNOSIS — E782 Mixed hyperlipidemia: Secondary | ICD-10-CM | POA: Diagnosis not present

## 2020-05-19 DIAGNOSIS — I10 Essential (primary) hypertension: Secondary | ICD-10-CM | POA: Diagnosis not present

## 2020-05-24 DIAGNOSIS — E782 Mixed hyperlipidemia: Secondary | ICD-10-CM | POA: Diagnosis not present

## 2020-05-24 DIAGNOSIS — M1990 Unspecified osteoarthritis, unspecified site: Secondary | ICD-10-CM | POA: Diagnosis not present

## 2020-05-24 DIAGNOSIS — I10 Essential (primary) hypertension: Secondary | ICD-10-CM | POA: Diagnosis not present

## 2020-05-24 DIAGNOSIS — E039 Hypothyroidism, unspecified: Secondary | ICD-10-CM | POA: Diagnosis not present

## 2020-08-16 DIAGNOSIS — H524 Presbyopia: Secondary | ICD-10-CM | POA: Diagnosis not present

## 2020-12-07 DIAGNOSIS — M25562 Pain in left knee: Secondary | ICD-10-CM | POA: Diagnosis not present

## 2020-12-07 DIAGNOSIS — Z96651 Presence of right artificial knee joint: Secondary | ICD-10-CM | POA: Diagnosis not present

## 2020-12-07 DIAGNOSIS — G8929 Other chronic pain: Secondary | ICD-10-CM | POA: Diagnosis not present

## 2020-12-07 DIAGNOSIS — M25561 Pain in right knee: Secondary | ICD-10-CM | POA: Diagnosis not present

## 2020-12-12 DIAGNOSIS — Z1211 Encounter for screening for malignant neoplasm of colon: Secondary | ICD-10-CM | POA: Diagnosis not present

## 2020-12-22 ENCOUNTER — Other Ambulatory Visit: Payer: Self-pay | Admitting: Family Medicine

## 2020-12-22 DIAGNOSIS — E2839 Other primary ovarian failure: Secondary | ICD-10-CM

## 2020-12-22 DIAGNOSIS — Z1231 Encounter for screening mammogram for malignant neoplasm of breast: Secondary | ICD-10-CM

## 2021-01-15 ENCOUNTER — Other Ambulatory Visit: Payer: Self-pay | Admitting: Orthopedic Surgery

## 2021-02-05 ENCOUNTER — Ambulatory Visit
Admission: RE | Admit: 2021-02-05 | Discharge: 2021-02-05 | Disposition: A | Discharge status: Home / Self Care | Payer: Medicare Other | Source: Ambulatory Visit | Attending: Family Medicine | Admitting: Family Medicine

## 2021-02-05 ENCOUNTER — Other Ambulatory Visit: Payer: Self-pay

## 2021-02-05 DIAGNOSIS — Z1231 Encounter for screening mammogram for malignant neoplasm of breast: Secondary | ICD-10-CM

## 2021-06-01 ENCOUNTER — Ambulatory Visit (INDEPENDENT_AMBULATORY_CARE_PROVIDER_SITE_OTHER): Payer: Medicare Other | Admitting: Nurse Practitioner

## 2021-06-01 ENCOUNTER — Ambulatory Visit
Admission: RE | Admit: 2021-06-01 | Discharge: 2021-06-01 | Disposition: A | Discharge status: Home / Self Care | Payer: Medicare Other | Source: Ambulatory Visit | Attending: Nurse Practitioner | Admitting: Nurse Practitioner

## 2021-06-01 ENCOUNTER — Encounter (HOSPITAL_BASED_OUTPATIENT_CLINIC_OR_DEPARTMENT_OTHER): Payer: Self-pay | Admitting: Nurse Practitioner

## 2021-06-01 ENCOUNTER — Other Ambulatory Visit: Payer: Self-pay

## 2021-06-01 ENCOUNTER — Ambulatory Visit (HOSPITAL_BASED_OUTPATIENT_CLINIC_OR_DEPARTMENT_OTHER)
Admission: RE | Admit: 2021-06-01 | Discharge: 2021-06-01 | Disposition: A | Discharge status: Home / Self Care | Payer: Medicare Other | Source: Ambulatory Visit | Attending: Nurse Practitioner | Admitting: Nurse Practitioner

## 2021-06-01 VITALS — BP 117/72 | HR 96 | Ht 61.0 in | Wt 114.0 lb

## 2021-06-01 DIAGNOSIS — M25511 Pain in right shoulder: Secondary | ICD-10-CM

## 2021-06-01 DIAGNOSIS — L578 Other skin changes due to chronic exposure to nonionizing radiation: Secondary | ICD-10-CM

## 2021-06-01 DIAGNOSIS — G8929 Other chronic pain: Secondary | ICD-10-CM

## 2021-06-01 DIAGNOSIS — E039 Hypothyroidism, unspecified: Secondary | ICD-10-CM | POA: Diagnosis not present

## 2021-06-01 DIAGNOSIS — E785 Hyperlipidemia, unspecified: Secondary | ICD-10-CM | POA: Diagnosis not present

## 2021-06-01 DIAGNOSIS — M19011 Primary osteoarthritis, right shoulder: Secondary | ICD-10-CM | POA: Diagnosis not present

## 2021-06-01 DIAGNOSIS — I10 Essential (primary) hypertension: Secondary | ICD-10-CM

## 2021-06-01 NOTE — Progress Notes (Signed)
[name], DNP, AGNP-c Primary Care & Sports Medicine [address]   [address] [place], [place] [address] [phone]  New patient visit   Patient: Bonnie Sanders   DOB: [birth year]   76 y.o. Female  MRN: [id number] Visit Date: 06/01/2021  Patient Care Team: Kassidy Dockendorf, Sung Amabile, NP as PCP - General (Nurse Practitioner)  Today's healthcare provider: [name], NP   Chief Complaint  Patient presents with   New Patient (Initial Visit)    Patient presents today to establish care. Overall she feels great. She would like a cortisone shot right shoulder, I told her she may have to come and see Dr Ihor Dow for that she agreed to that plan. She would like a refill on Tretinion gel 0.05% and lisinopril, Rosuvastatin as she is out.    Subjective    Bonnie Sanders is a 76 y.o. female who presents today as a new patient to establish care.    Patient endorses the following concerns presently: Right shoulder pain She reports that she started having pain in October of last year and it really bothered her through the holidays.  She reports that it feels better in the warmer weather, but definitely exacerbated with the cold. She has difficulty and pain with lifting and carrying things. She cannot remember any specific injury to the area. This has been an issue in the past and she has had a cortisone injection into the joint previously. SHe is interested in that today, if possible.   She has a history of HTN and HLD which she reports is well controlled. No HA, CP, palpitations, ShOB, LE edema concerns. She is on a statin, fenofibrate, and ASA therapy.  She has a history of hypothyroidism and is well controlled with synthroid. She denies any symptoms of hot or cold intolerance, weight gain or loss, fullness in the throat, anxiety, or palpitations.  She has a history of osteoporosis and is taking calcium and vitamin D supplementation.  She endorses using Tretinoin gel to her face  daily for sun damage history. This has previously been prescribed by dermatology. She is requesting a refill on this.    History reviewed and reveals the following: Past Medical History:  Diagnosis Date   Allergic rhinitis    Allergic rhinitis due to pollen 11/30/2010   Depression    Dyslipidemia    GERD (gastroesophageal reflux disease)    Heme positive stool    Hip pain, left 11/17/2013   Hyperlipidemia    Hypertension    Hypothyroid    Osteoporosis    Other screening mammogram    Primary osteoarthritis of left knee 07/25/2015   Past Surgical History:  Procedure Laterality Date   ABDOMINAL HYSTERECTOMY     COLONOSCOPY  2009   FOOT SURGERY     TOTAL KNEE ARTHROPLASTY     right foot   Family Status  Relation Name Status   Mother  (Not Specified)   Father  (Not Specified)   MGM  (Not Specified)   MGF  (Not Specified)   PGM  (Not Specified)   PGF  (Not Specified)   Family History  Problem Relation Age of Onset   Hypertension Mother    Arthritis Mother    Hypertension Father    Heart disease Father    Hyperlipidemia Father    Arthritis Father    Hypertension Maternal Grandmother    Stroke Maternal Grandmother    Heart disease Maternal Grandmother    Arthritis  Maternal Grandmother    Hypertension Maternal Grandfather    Arthritis Maternal Grandfather    Hypertension Paternal Grandmother    Arthritis Paternal Grandmother    Hypertension Paternal Grandfather    Arthritis Paternal Grandfather    Social History   Socioeconomic History   Marital status: Married    Spouse name: Not on file   Number of children: Not on file   Years of education: Not on file   Highest education level: Not on file  Occupational History   Not on file  Tobacco Use   Smoking status: Never   Smokeless tobacco: Never  Substance and Sexual Activity   Alcohol use: No   Drug use: No   Sexual activity: Not Currently  Other Topics Concern   Not on file  Social History Narrative   Not  on file   Social Determinants of Health   Financial Resource Strain: Not on file  Food Insecurity: Not on file  Transportation Needs: Not on file  Physical Activity: Not on file  Stress: Not on file  Social Connections: Not on file   Outpatient Medications Prior to Visit  Medication Sig   [DISCONTINUED] tretinoin (RETIN-A) 0.05 % cream    amitriptyline (ELAVIL) 150 MG tablet Take 1 tablet (150 mg total) by mouth at bedtime.   fenofibrate (TRICOR) 145 MG tablet    fluticasone (FLONASE) 50 MCG/ACT nasal spray Place 2 sprays into the nose daily.   levothyroxine (SYNTHROID) 75 MCG tablet Take 75 mcg by mouth every morning.   montelukast (SINGULAIR) 10 MG tablet Take 1 tablet (10 mg total) by mouth at bedtime. (Patient not taking: Reported on 09/15/2017)   Multiple Vitamin (MULTIVITAMIN) tablet Take 1 tablet by mouth daily.     omeprazole (PRILOSEC) 20 MG capsule Take 20 mg by mouth daily.   [DISCONTINUED] alendronate (FOSAMAX) 70 MG tablet Take 1 tablet (70 mg total) by mouth every 7 (seven) days. Take with a full glass of water on an empty stomach.   [DISCONTINUED] aspirin 81 MG tablet Take 81 mg by mouth daily.     [DISCONTINUED] atorvastatin (LIPITOR) 20 MG tablet Take 1 tablet (20 mg total) by mouth daily. (Patient not taking: Reported on 09/15/2017)   [DISCONTINUED] Calcium Carbonate-Vitamin D (CALCIUM + D) 600-200 MG-UNIT TABS Take 1 tablet by mouth 1 dose over 46 hours.     [DISCONTINUED] celecoxib (CELEBREX) 200 MG capsule TAKE ONE CAPSULE BY MOUTH TWICE A DAY AS NEEDED FOR PAIN AND INFLAMMATION   [DISCONTINUED] fish oil-omega-3 fatty acids 1000 MG capsule Take 2 g by mouth daily.     [DISCONTINUED] levothyroxine (SYNTHROID) 88 MCG tablet Take 1 tablet (88 mcg total) by mouth daily.   [DISCONTINUED] levothyroxine (SYNTHROID, LEVOTHROID) 100 MCG tablet TAKE 1 TABLET EVERY MORNING ON AN EMPTY STOMACH   [DISCONTINUED] lisinopril (PRINIVIL,ZESTRIL) 5 MG tablet Take 1 tablet (5 mg total) by  mouth daily.   [DISCONTINUED] meloxicam (MOBIC) 15 MG tablet TAKE 1 TABLET BY MOUTH EVERY DAY   [DISCONTINUED] neomycin-polymyxin-hydrocortisone (CORTISPORIN) otic solution Apply 1-2 drops to toe after soaking BID (Patient not taking: Reported on 09/15/2017)   [DISCONTINUED] SIMVASTATIN PO Take by mouth.   [DISCONTINUED] WELCHOL 625 MG tablet    [DISCONTINUED] ZETIA 10 MG tablet TAKE 1 TABLET (10 MG) BY MOUTH DAILY   No facility-administered medications prior to visit.   Allergies  Allergen Reactions   Codeine Anxiety and Rash   Statins Other (See Comments)    myalgias   Immunization History  Administered  Date(s) Administered   Influenza Whole 12/03/2010   Influenza, High Dose Seasonal PF 12/26/2012, 12/12/2016   Influenza-Unspecified 12/07/2012, 12/07/2017, 12/07/2020   PFIZER(Purple Top)SARS-COV-2 Vaccination 06/10/2019, 07/07/2019   Pneumococcal Polysaccharide-23 06/02/2012   Tdap 12/03/2010, 04/09/2011   Zoster, Live 04/08/2008, 03/06/2010    Health Maintenance Due: Health Maintenance  Topic Date Due   Zoster Vaccines- Shingrix (1 of 2) Never done   Pneumonia Vaccine 6065+ Years old (2 - PCV) 06/02/2013   COLONOSCOPY (Pts 45-615yrs Insurance coverage will need to be confirmed)  03/06/2015   COVID-19 Vaccine (3 - Booster for Pfizer series) 09/01/2019   TETANUS/TDAP  04/08/2021   INFLUENZA VACCINE  Completed   DEXA SCAN  Completed   HPV VACCINES  Aged Out   Hepatitis C Screening  Discontinued    Review of Systems All review of systems negative except what is listed in the HPI   Objective    BP 117/72    Pulse 96    Ht 5\' 1"  (1.549 m)    Wt 114 lb (51.7 kg)    SpO2 96%    BMI 21.54 kg/m  Physical Exam Vitals and nursing note reviewed.  Constitutional:      General: She is not in acute distress.    Appearance: Normal appearance.  Eyes:     Extraocular Movements: Extraocular movements intact.     Conjunctiva/sclera: Conjunctivae normal.     Pupils: Pupils are equal,  round, and reactive to light.  Neck:     Vascular: No carotid bruit.  Cardiovascular:     Rate and Rhythm: Normal rate and regular rhythm.     Pulses: Normal pulses.     Heart sounds: Normal heart sounds. No murmur heard. Pulmonary:     Effort: Pulmonary effort is normal.     Breath sounds: Normal breath sounds. No wheezing.  Abdominal:     General: Bowel sounds are normal.     Palpations: Abdomen is soft.  Musculoskeletal:        General: Normal range of motion.     Cervical back: Normal range of motion.     Right lower leg: No edema.     Left lower leg: No edema.  Skin:    General: Skin is warm and dry.     Capillary Refill: Capillary refill takes less than 2 seconds.  Neurological:     General: No focal deficit present.     Mental Status: She is alert and oriented to person, place, and time.  Psychiatric:        Mood and Affect: Mood normal.        Behavior: Behavior normal.        Thought Content: Thought content normal.        Judgment: Judgment normal.    No results found for any visits on 06/01/21.  Assessment & Plan      Problem List Items Addressed This Visit     Hypothyroid    Chronic. Taking medication as prescribed. No concerning symptoms present today.  Continue current medications.  Will plan for labs in near future with AWV      Relevant Medications   levothyroxine (SYNTHROID) 75 MCG tablet   Hyperlipidemia with target low density lipoprotein (LDL) cholesterol less than 130 mg/dL    Chronic. Currently on fenofibrate and statin therapy at low dose.  Diet monitoring in place.  No alarm symptoms present today.  Continue current treatment. Will plan for labs with AW[facility] in near future.  Relevant Medications   lisinopril (ZESTRIL) 10 MG tablet   Hypertension    Chronic. Well controlled. No alarm symptoms present.  Recommend continuation of current medication.  Will plan for [facility] and labs in near future.       Relevant Medications   lisinopril  (ZESTRIL) 10 MG tablet   Chronic right shoulder pain - Primary    Acute exacerbation of right sided shoulder pain, likely due to osteoarthritic condition.  Recommend f/u with Dr. Ihor Dow for evaluation for joint injection. Will have x-ray today to ensure no changes or acute pathologies present that would prevent injection.  F/U next week with Dr. Ihor Dow      Relevant Orders   DG Shoulder Right (Completed)   Sun-damaged skin    Treatment with tretinoin through dermatology historically with excellent results.  No concerning side effects present.  Will plan to continue.       Relevant Medications   tretinoin (ALTRALIN) 0.05 % gel     Return for [facility] next week with Dr. Algis Downs for shoulder injection.      Tayloranne Lekas, Sung Amabile, NP, DNP, AGNP-C Primary Care & Sports Medicine at [facility]

## 2021-06-01 NOTE — Patient Instructions (Signed)
Thank you for choosing [place] at [facility] for your Primary Care needs. I am excited for the opportunity to partner with you to meet your health care goals. It was a pleasure meeting you today!  Recommendations from today's visit: You can use a lidocaine patch on the skin around the shoulder, this can help with the pain by putting numbing medicine into the skin. You can wear this for 12 hours and then when you take it off you can apply the biofreeze and voltaren gel.  I also recommend continuing to use the voltaren gel and you can mix that with the biofreeze samples that I gave you to help with the pain.  We will get you in with Dr. Burnard Bunting or Dr. Darrold Span next week for further evaluation and possible injection.   Information on diet, exercise, and health maintenance recommendations are listed below. This is information to help you be sure you are on track for optimal health and monitoring.   Please look over this and let us know if you have any questions or if you have completed any of the health maintenance outside of [place] so that we can be sure your records are up to date.  ___________________________________________________________ About Me: I am an Adult-Geriatric Nurse Practitioner with a background in caring for patients for more than 20 years with a strong intensive care background. I provide primary care and sports medicine services to patients age 89 and older within this office. My education had a strong focus on caring for the older adult population, which I am passionate about. I am also the director of the APP Fellowship with [facility].   My desire is to provide you with the best service through preventive medicine and supportive care. I consider you a part of the medical team and value your input. I work diligently to ensure that you are heard and your needs are met in a safe and effective manner. I want you to feel comfortable with me as your provider  and want you to know that your health concerns are important to me.  For your information, our office hours are: Monday, Tuesday, and Thursday 8:00 AM - 5:00 PM Wednesday and Friday 8:00 AM - 12:00 PM.   In my time away from the office I am teaching new APP's within the system and am unavailable, but my partner, Dr. Burnard Bunting is in the office for emergent needs.   If you have questions or concerns, please call our office at [phone] or send [place] a MyChart message and we will respond as quickly as possible.  ____________________________________________________________ MyChart:  For all urgent or time sensitive needs we ask that you please call the office to avoid delays. Our number is [phone]. MyChart is not constantly monitored and due to the large volume of messages a day, replies may take up to 72 business hours.  MyChart Policy: MyChart allows for you to see your visit notes, after visit summary, provider recommendations, lab and tests results, make an appointment, request refills, and contact your provider or the office for non-urgent questions or concerns. Providers are seeing patients during normal business hours and do not have built in time to review MyChart messages.  We ask that you allow a minimum of 3 business days for responses to [employer]. For this reason, please do not send urgent requests through [place]. Please call the office at [phone]. New and ongoing conditions may require a visit. We have virtual and in person  visit available for your convenience.  Complex MyChart concerns may require a visit. Your provider may request you schedule a virtual or in person visit to ensure we are providing the best care possible. MyChart messages sent after 11:00 AM on Friday will not be received by the provider until Monday morning.    Lab and Test Results: You will receive your lab and test results on MyChart as soon as they are completed and results have been sent by the  lab or testing facility. Due to this service, you will receive your results BEFORE your provider.  I review lab and tests results each morning prior to seeing patients. Some results require collaboration with other providers to ensure you are receiving the most appropriate care. For this reason, we ask that you please allow a minimum of 3-5 business days from the time the ALL results have been received for your provider to receive and review lab and test results and contact you about these.  Most lab and test result comments from the provider will be sent through [place]. Your provider may recommend changes to the plan of care, follow-up visits, repeat testing, ask questions, or request an office visit to discuss these results. You may reply directly to this message or call the office at [phone] to provide information for the provider or set up an appointment. In some instances, you will be called with test results and recommendations. Please let us know if this is preferred and we will make note of this in your chart to provide this for you.    If you have not heard a response to your lab or test results in 5 business days from all results returning to [place], please call the office to let us know. We ask that you please avoid calling prior to this time unless there is an emergent concern. Due to high call volumes, this can delay the resulting process.  After Hours: For all non-emergency after hours needs, please call the office at [phone] and select the option to reach the on-call provider service. On-call services are shared between multiple [place] offices and therefore it will not be possible to speak directly with your provider. On-call providers may provide medical advice and recommendations, but are unable to provide refills for maintenance medications.  For all emergency or urgent medical needs after normal business hours, we recommend that you seek care at the closest Urgent Care or  Emergency Department to ensure appropriate treatment in a timely manner.  [facility] [place] has a 24 hour emergency room located on the ground floor for your convenience.   Urgent Concerns During the Business Day Providers are seeing patients from 8AM to [place] with a busy schedule and are most often not able to respond to non-urgent calls until the end of the day or the next business day. If you should have URGENT concerns during the day, please call and speak to the nurse or schedule a same day appointment so that we can address your concern without delay.   Thank you, again, for choosing me as your health care partner. I appreciate your trust and look forward to learning more about you.   Worthy Keeler, DNP, AGNP-c ___________________________________________________________  Health Maintenance Recommendations Screening Testing Mammogram Every 1 -2 years based on history and risk factors Starting at age 23 Pap Smear Ages 21-39 every 3 years Ages 37-65 every 5 years with HPV testing More frequent testing may be required based on results and history Colon  Cancer Screening Every 1-10 years based on test performed, risk factors, and history Starting at age 26 Bone Density Screening Every 2-10 years based on history Starting at age 44 for women Recommendations for men differ based on medication usage, history, and risk factors AAA Screening One time ultrasound Men 20-74 years old who have every smoked Lung Cancer Screening Low Dose Lung CT every 12 months Age 41-80 years with a 30 pack-year smoking history who still smoke or who have quit within the last 15 years  Screening Labs Routine  Labs: Complete Blood Count (CBC), Complete Metabolic Panel (CMP), Cholesterol (Lipid Panel) Every 6-12 months based on history and medications May be recommended more frequently based on current conditions or previous results Hemoglobin A1c Lab Every 3-12 months based on history  and previous results Starting at age 79 or earlier with diagnosis of diabetes, high cholesterol, BMI >26, and/or risk factors Frequent monitoring for patients with diabetes to ensure blood sugar control Thyroid Panel (TSH w/ T3 & T4) Every 6 months based on history, symptoms, and risk factors May be repeated more often if on medication HIV One time testing for all patients 65 and older May be repeated more frequently for patients with increased risk factors or exposure Hepatitis C One time testing for all patients 82 and older May be repeated more frequently for patients with increased risk factors or exposure Gonorrhea, Chlamydia Every 12 months for all sexually active persons 13-24 years Additional monitoring may be recommended for those who are considered high risk or who have symptoms PSA Men 43-74 years old with risk factors Additional screening may be recommended from age 67-69 based on risk factors, symptoms, and history  Vaccine Recommendations Tetanus Booster All adults every 10 years Flu Vaccine All patients 6 months and older every year COVID Vaccine All patients 12 years and older Initial dosing with booster May recommend additional booster based on age and health history HPV Vaccine 2 doses all patients age 44-26 Dosing may be considered for patients over 26 Shingles Vaccine (Shingrix) 2 doses all adults 62 years and older Pneumonia (Pneumovax 23) All adults 84 years and older May recommend earlier dosing based on health history Pneumonia (Prevnar 29) All adults 24 years and older Dosed 1 year after Pneumovax 23  Additional Screening, Testing, and Vaccinations may be recommended on an individualized basis based on family history, health history, risk factors, and/or exposure.  __________________________________________________________  Diet Recommendations for All Patients  I recommend that all patients maintain a diet low in saturated fats, carbohydrates, and  cholesterol. While this can be challenging at first, it is not impossible and small changes can make big differences.  Things to try: Decreasing the amount of soda, sweet tea, and/or juice to one or less per day and replace with water While water is always the first choice, if you do not like water you may consider adding a water additive without sugar to improve the taste other sugar free drinks Replace potatoes with a brightly colored vegetable at dinner Use healthy oils, such as canola oil or olive oil, instead of butter or hard margarine Limit your bread intake to two pieces or less a day Replace regular pasta with low carb pasta options Bake, broil, or grill foods instead of frying Monitor portion sizes  Eat smaller, more frequent meals throughout the day instead of large meals  An important thing to remember is, if you love foods that are not great for your health, you don't have to give  them up completely. Instead, allow these foods to be a reward when you have done well. Allowing yourself to still have special treats every once in a while is a nice way to tell yourself thank you for working hard to keep yourself healthy.   Also remember that every day is a new day. If you have a bad day and "fall off the wagon", you can still climb right back up and keep moving along on your journey!  We have resources available to help you!  Some websites that may be helpful include: www.http://carter.biz/  Www.VeryWellFit.com _____________________________________________________________  Activity Recommendations for All Patients  I recommend that all adults get at least 20 minutes of moderate physical activity that elevates your heart rate at least 5 days out of the week.  Some examples include: Walking or jogging at a pace that allows you to carry on a conversation Cycling (stationary bike or outdoors) Water aerobics Yoga Weight lifting Dancing If physical limitations prevent you from putting  stress on your joints, exercise in a pool or seated in a chair are excellent options.  Do determine your MAXIMUM heart rate for activity: YOUR AGE - 220 = MAX HeartRate   Remember! Do not push yourself too hard.  Start slowly and build up your pace, speed, weight, time in exercise, etc.  Allow your body to rest between exercise and get good sleep. You will need more water than normal when you are exerting yourself. Do not wait until you are thirsty to drink. Drink with a purpose of getting in at least 8, 8 ounce glasses of water a day plus more depending on how much you exercise and sweat.    If you begin to develop dizziness, chest pain, abdominal pain, jaw pain, shortness of breath, headache, vision changes, lightheadedness, or other concerning symptoms, stop the activity and allow your body to rest. If your symptoms are severe, seek emergency evaluation immediately. If your symptoms are concerning, but not severe, please let us know so that we can recommend further evaluation.

## 2021-06-02 ENCOUNTER — Other Ambulatory Visit (HOSPITAL_BASED_OUTPATIENT_CLINIC_OR_DEPARTMENT_OTHER): Payer: Self-pay | Admitting: Nurse Practitioner

## 2021-06-04 ENCOUNTER — Ambulatory Visit (INDEPENDENT_AMBULATORY_CARE_PROVIDER_SITE_OTHER): Payer: Medicare Other | Admitting: Family Medicine

## 2021-06-04 ENCOUNTER — Telehealth (HOSPITAL_BASED_OUTPATIENT_CLINIC_OR_DEPARTMENT_OTHER): Payer: Self-pay | Admitting: Nurse Practitioner

## 2021-06-04 ENCOUNTER — Encounter (HOSPITAL_BASED_OUTPATIENT_CLINIC_OR_DEPARTMENT_OTHER): Payer: Self-pay | Admitting: Family Medicine

## 2021-06-04 ENCOUNTER — Other Ambulatory Visit: Payer: Self-pay

## 2021-06-04 DIAGNOSIS — G8929 Other chronic pain: Secondary | ICD-10-CM | POA: Diagnosis not present

## 2021-06-04 DIAGNOSIS — M25511 Pain in right shoulder: Secondary | ICD-10-CM | POA: Insufficient documentation

## 2021-06-04 MED ORDER — ROSUVASTATIN CALCIUM 5 MG PO TABS: 5.0000 mg | ORAL_TABLET | Freq: Every day | ORAL | 0 refills | Status: DC

## 2021-06-04 MED ORDER — LISINOPRIL 10 MG PO TABS: 10.0000 mg | ORAL_TABLET | Freq: Every day | ORAL | 0 refills | Status: DC

## 2021-06-04 NOTE — Telephone Encounter (Signed)
Pt came in and is needing medication that was sentin yeterday to a different pharmacy due to the pharmacy being out of one medication and not releasing the other.  lisinopril (ZESTRIL) 10 MG tablet [id number]   rosuvastatin (CRESTOR) 5 MG tablet [id number]  Send it to: [facility], [place], [place] [id number]  Please advise.

## 2021-06-04 NOTE — Progress Notes (Signed)
° ° °  Procedures performed today:    None.  Independent interpretation of notes and tests performed by another provider:   None.  Brief History, Exam, Impression, and Recommendations:    Ht 5\' 1"  (1.549 m)    Wt 114 lb (51.7 kg)    BMI 21.54 kg/m   Chronic right shoulder pain Bonnie Sanders is a 76 year old female presenting for evaluation of right shoulder pain.  Initially recalls having pain last winter.  At that time, she reports that she received a steroid injection with some improvement in symptoms.  Generally pain has been intermittent, has become more constant recently.  Pain will be over anterior shoulder, also indicates pectoral and collarbone area.  She has been trying Tylenol, Motrin, palpable Voltaren gel.  Pain is worse with carrying plates, walking dogs.  Patient is left-handed. On exam: Right shoulder: Obvious swelling, bruising or erythema: absent Deformity of the shoulder: absent Notable tenderness along periscapular muscles, most notably along medial aspect of scapula Active ROM: diminished range with pain Passive ROM: full range with pain Strength: normal/normal Empty can: Negative Hawkins: Mildly positive Neer's: Negative Neurovascular exam: intact  Patient exam, pain is largely related to spasm of periscapular muscles, particularly along rhomboid medially to right scapula.  Do not feel that there is a particular discrete area which would be amenable to trigger point injection at this time Feel that patient would benefit from physical therapy, referral placed for patient to establish downstairs Can continue with conservative measures including Tylenol, NSAID, heating pad, topical therapies Plan for follow-up in about 6 weeks to monitor progress with above   ___________________________________________ Bonnie Halm de 06-24-1985, MD, ABFM, CAQSM Primary Care and Sports Medicine [facility]

## 2021-06-04 NOTE — Patient Instructions (Signed)
°  Medication Instructions:  Your physician recommends that you continue on your current medications as directed. Please refer to the Current Medication list given to you today. --If you need a refill on any your medications before your next appointment, please call your pharmacy first. If no refills are authorized on file call the office.--  Referrals/Procedures/Imaging: A referral has been placed for you to go to Outpatient Physical Therapy at [facility]. Someone from the scheduling department will be in contact with you in regards to coordinating your consultation. If you do not hear from any of the schedulers within 7-10 business days please give our office a call.  [facility] [address] Located on the 1st Floor Hours of Operation: Monday - Friday 8:00 am - 5:00 pm Closed on weekends and all major holidays (P) [phone]  Follow-Up: Your next appointment:   Your physician recommends that you schedule a follow-up appointment in: 6 WEEKS with Dr. de Cuba  You will receive a text message or e-mail with a link to a survey about your care and experience with us today! We would greatly appreciate your feedback!   Thanks for letting us be apart of your health journey!!  Primary Care and Sports Medicine   Dr. Raymond de Cuba   We encourage you to activate your patient portal called "MyChart".  Sign up information is provided on this After Visit Summary.  MyChart is used to connect with patients for Virtual Visits (Telemedicine).  Patients are able to view lab/test results, encounter notes, upcoming appointments, etc.  Non-urgent messages can be sent to your provider as well. To learn more about what you can do with MyChart, please visit --  [email or web address].    

## 2021-06-06 ENCOUNTER — Encounter (HOSPITAL_BASED_OUTPATIENT_CLINIC_OR_DEPARTMENT_OTHER): Payer: Self-pay | Admitting: Nurse Practitioner

## 2021-06-06 ENCOUNTER — Other Ambulatory Visit: Payer: Self-pay

## 2021-06-06 ENCOUNTER — Ambulatory Visit
Admission: RE | Admit: 2021-06-06 | Discharge: 2021-06-06 | Disposition: A | Discharge status: Home / Self Care | Payer: Medicare Other | Source: Ambulatory Visit | Attending: Family Medicine | Admitting: Family Medicine

## 2021-06-06 DIAGNOSIS — Z780 Asymptomatic menopausal state: Secondary | ICD-10-CM | POA: Diagnosis not present

## 2021-06-06 DIAGNOSIS — L578 Other skin changes due to chronic exposure to nonionizing radiation: Secondary | ICD-10-CM | POA: Insufficient documentation

## 2021-06-06 DIAGNOSIS — M85831 Other specified disorders of bone density and structure, right forearm: Secondary | ICD-10-CM | POA: Diagnosis not present

## 2021-06-06 DIAGNOSIS — E2839 Other primary ovarian failure: Secondary | ICD-10-CM

## 2021-06-06 MED ORDER — TRETINOIN 0.05 % EX GEL: CUTANEOUS | 1 refills | Status: DC

## 2021-06-06 MED ORDER — LISINOPRIL 10 MG PO TABS: 10.0000 mg | ORAL_TABLET | Freq: Every day | ORAL | 1 refills | Status: DC

## 2021-06-06 NOTE — Assessment & Plan Note (Signed)
Treatment with tretinoin through dermatology historically with excellent results.  ?No concerning side effects present.  ?Will plan to continue.  ?

## 2021-06-06 NOTE — Assessment & Plan Note (Signed)
Chronic. Well controlled. No alarm symptoms present.  ?Recommend continuation of current medication.  ?Will plan for AWV and labs in near future.  ?

## 2021-06-06 NOTE — Assessment & Plan Note (Signed)
Acute exacerbation of right sided shoulder pain, likely due to osteoarthritic condition.  ?Recommend f/u with Dr. Ihor Dow for evaluation for joint injection. Will have x-ray today to ensure no changes or acute pathologies present that would prevent injection.  ?F/U next week with Dr. Ihor Dow ?

## 2021-06-06 NOTE — Assessment & Plan Note (Addendum)
Chronic. Taking medication as prescribed. No concerning symptoms present today.  ?Continue current medications.  ?Will plan for labs in near future with AWV ?

## 2021-06-06 NOTE — Assessment & Plan Note (Signed)
Chronic. Currently on fenofibrate and statin therapy at low dose.  ?Diet monitoring in place.  ?No alarm symptoms present today.  ?Continue current treatment. Will plan for labs with AWV in near future.  ?

## 2021-06-14 NOTE — Assessment & Plan Note (Addendum)
Ciin is a 76 year old female presenting for evaluation of right shoulder pain.  Initially recalls having pain last winter.  At that time, she reports that she received a steroid injection with some improvement in symptoms.  Generally pain has been intermittent, has become more constant recently.  Pain will be over anterior shoulder, also indicates pectoral and collarbone area.  She has been trying Tylenol, Motrin, palpable Voltaren gel.  Pain is worse with carrying plates, walking dogs.  Patient is left-handed. ?On exam: ?Right shoulder: ?Obvious swelling, bruising or erythema: absent ?Deformity of the shoulder: absent ?Notable tenderness along periscapular muscles, most notably along medial aspect of scapula ?Active ROM: diminished range with pain ?Passive ROM: full range with pain ?Strength: normal/normal ?Empty can: Negative ?Hawkins: Mildly positive ?Neer's: Negative ?Neurovascular exam: intact ? ?Patient exam, pain is largely related to spasm of periscapular muscles, particularly along rhomboid medially to right scapula.  Do not feel that there is a particular discrete area which would be amenable to trigger point injection at this time ?Feel that patient would benefit from physical therapy, referral placed for patient to establish downstairs ?Can continue with conservative measures including Tylenol, NSAID, heating pad, topical therapies ?Plan for follow-up in about 6 weeks to monitor progress with above ?

## 2021-06-19 ENCOUNTER — Other Ambulatory Visit (HOSPITAL_BASED_OUTPATIENT_CLINIC_OR_DEPARTMENT_OTHER): Payer: Self-pay

## 2021-06-19 ENCOUNTER — Other Ambulatory Visit: Payer: Self-pay | Admitting: Nurse Practitioner

## 2021-06-19 ENCOUNTER — Other Ambulatory Visit: Payer: Self-pay | Admitting: Family Medicine

## 2021-06-19 ENCOUNTER — Other Ambulatory Visit (HOSPITAL_BASED_OUTPATIENT_CLINIC_OR_DEPARTMENT_OTHER): Payer: Self-pay | Admitting: Nurse Practitioner

## 2021-06-19 DIAGNOSIS — I10 Essential (primary) hypertension: Secondary | ICD-10-CM

## 2021-06-19 DIAGNOSIS — L578 Other skin changes due to chronic exposure to nonionizing radiation: Secondary | ICD-10-CM

## 2021-06-19 MED ORDER — AMITRIPTYLINE HCL 150 MG PO TABS
150.0000 mg | ORAL_TABLET | Freq: Every day | ORAL | 3 refills | Status: DC
Start: 2021-06-19 — End: 2022-02-18
  Filled 2021-06-19: qty 90, 90d supply, fill #0
  Filled 2022-02-15: qty 90, 90d supply, fill #1

## 2021-06-19 MED ORDER — ROSUVASTATIN CALCIUM 5 MG PO TABS
5.0000 mg | ORAL_TABLET | Freq: Every day | ORAL | 0 refills | Status: DC
  Filled 2021-06-19 – 2021-07-27 (×2): qty 90, 90d supply, fill #0

## 2021-06-19 MED ORDER — LISINOPRIL 10 MG PO TABS
10.0000 mg | ORAL_TABLET | Freq: Every day | ORAL | 1 refills | Status: DC
  Filled 2021-06-19 – 2021-08-21 (×3): qty 90, 90d supply, fill #0

## 2021-06-19 MED ORDER — TRETINOIN 0.05 % EX CREA
TOPICAL_CREAM | CUTANEOUS | 1 refills | Status: DC
  Filled 2021-06-19: qty 45, 30d supply, fill #0
  Filled 2021-07-17: qty 40, 30d supply, fill #0
  Filled 2022-05-23: qty 40, 30d supply, fill #1

## 2021-06-19 MED ORDER — MONTELUKAST SODIUM 10 MG PO TABS
10.0000 mg | ORAL_TABLET | Freq: Every day | ORAL | 3 refills | Status: DC
  Filled 2021-06-19 – 2021-06-25 (×4): qty 90, 90d supply, fill #0
  Filled 2021-10-03: qty 90, 90d supply, fill #1
  Filled 2022-01-18: qty 90, 90d supply, fill #2
  Filled 2022-04-05: qty 90, 90d supply, fill #3

## 2021-06-20 ENCOUNTER — Other Ambulatory Visit (HOSPITAL_BASED_OUTPATIENT_CLINIC_OR_DEPARTMENT_OTHER): Payer: Self-pay

## 2021-06-21 ENCOUNTER — Other Ambulatory Visit (HOSPITAL_BASED_OUTPATIENT_CLINIC_OR_DEPARTMENT_OTHER): Payer: Self-pay

## 2021-06-22 ENCOUNTER — Telehealth (HOSPITAL_BASED_OUTPATIENT_CLINIC_OR_DEPARTMENT_OTHER): Payer: Self-pay

## 2021-06-22 ENCOUNTER — Other Ambulatory Visit (HOSPITAL_BASED_OUTPATIENT_CLINIC_OR_DEPARTMENT_OTHER): Payer: Self-pay

## 2021-06-22 MED ORDER — LEVOTHYROXINE SODIUM 75 MCG PO TABS
75.0000 ug | ORAL_TABLET | Freq: Every morning | ORAL | 3 refills | Status: DC
  Filled 2021-06-22: qty 30, 30d supply, fill #0
  Filled 2021-07-30: qty 30, 30d supply, fill #1
  Filled 2021-08-31: qty 30, 30d supply, fill #2
  Filled 2021-10-29: qty 30, 30d supply, fill #3

## 2021-06-22 MED ORDER — OMEPRAZOLE 20 MG PO CPDR
20.0000 mg | DELAYED_RELEASE_CAPSULE | Freq: Every day | ORAL | 3 refills | Status: DC
  Filled 2021-06-22: qty 30, 30d supply, fill #0
  Filled 2021-07-27: qty 30, 30d supply, fill #1
  Filled 2021-08-21: qty 30, 30d supply, fill #2
  Filled 2021-10-03: qty 30, 30d supply, fill #3

## 2021-06-22 MED ORDER — FENOFIBRATE 145 MG PO TABS
145.0000 mg | ORAL_TABLET | Freq: Once | ORAL | 3 refills | Status: DC
  Filled 2021-06-22: qty 90, 90d supply, fill #0
  Filled 2021-10-03: qty 90, 90d supply, fill #1
  Filled 2022-01-18: qty 90, 90d supply, fill #2

## 2021-06-25 ENCOUNTER — Ambulatory Visit (HOSPITAL_BASED_OUTPATIENT_CLINIC_OR_DEPARTMENT_OTHER): Payer: Medicare Other | Attending: Family Medicine | Admitting: Physical Therapy

## 2021-06-25 ENCOUNTER — Other Ambulatory Visit: Payer: Self-pay

## 2021-06-25 ENCOUNTER — Encounter (HOSPITAL_BASED_OUTPATIENT_CLINIC_OR_DEPARTMENT_OTHER): Payer: Self-pay | Admitting: Physical Therapy

## 2021-06-25 ENCOUNTER — Other Ambulatory Visit (HOSPITAL_BASED_OUTPATIENT_CLINIC_OR_DEPARTMENT_OTHER): Payer: Self-pay

## 2021-06-25 DIAGNOSIS — G8929 Other chronic pain: Secondary | ICD-10-CM | POA: Insufficient documentation

## 2021-06-25 DIAGNOSIS — M25511 Pain in right shoulder: Secondary | ICD-10-CM | POA: Insufficient documentation

## 2021-06-25 DIAGNOSIS — M6281 Muscle weakness (generalized): Secondary | ICD-10-CM | POA: Diagnosis not present

## 2021-06-25 DIAGNOSIS — M25611 Stiffness of right shoulder, not elsewhere classified: Secondary | ICD-10-CM | POA: Diagnosis not present

## 2021-06-25 NOTE — Therapy (Signed)
?OUTPATIENT PHYSICAL THERAPY SHOULDER EVALUATION ? ? ?Patient Name: Bonnie Sanders ?MRN: [id number] ?DOB:10/19/1945, 76 y.o., female ?Today's Date: 06/25/2021 ? ? PT End of Session - 06/25/21 1358   ? ? Visit Number 1   ? Number of Visits 16   ? Date for PT Re-Evaluation 09/23/21   ? Authorization Type BCBS MCR   ? PT Start Time 1345   ? PT Stop Time 1430   ? PT Time Calculation (min) 45 min   ? Activity Tolerance Patient tolerated treatment well   ? Behavior During Therapy [facility] for tasks assessed/performed   ? ?  ?  ? ?  ? ? ?Past Medical History:  ?Diagnosis Date  ? Allergic rhinitis   ? Allergic rhinitis due to pollen 11/30/2010  ? Depression   ? Dyslipidemia   ? GERD (gastroesophageal reflux disease)   ? Heme positive stool   ? Hip pain, left 11/17/2013  ? Hyperlipidemia   ? Hypertension   ? Hypothyroid   ? Osteoporosis   ? Other screening mammogram   ? Primary osteoarthritis of left knee 07/25/2015  ? ?Past Surgical History:  ?Procedure Laterality Date  ? ABDOMINAL HYSTERECTOMY    ? COLONOSCOPY  2009  ? FOOT SURGERY    ? TOTAL KNEE ARTHROPLASTY    ? right foot  ? ?Patient Active Problem List  ? Diagnosis Date Noted  ? Sun-damaged skin 06/06/2021  ? Chronic right shoulder pain 06/04/2021  ? Fecal incontinence 01/28/2017  ? Grade I hemorrhoids 01/28/2017  ? Status post total right knee replacement using cement 07/25/2015  ? Midline low back pain with sciatica 12/07/2013  ? Colonic polyp 03/24/2012  ? Hypertension 11/30/2010  ? Osteoporosis 11/30/2010  ? Hypothyroid 10/25/2010  ? Hyperlipidemia with target low density lipoprotein (LDL) cholesterol less than 130 mg/dL 59/16/3846  ? Osteoarthritis, multiple sites 01/22/2010  ? Allergic rhinitis 05/25/2009  ? Vitamin B12 deficiency 05/25/2009  ? Atrophic vaginitis 04/10/2009  ? Gastro-esophageal reflux disease with esophagitis 10/20/2008  ? Tic disorder, unspecified 08/16/2008  ? ? ?PCP: [name], NP ? ?REFERRING PROVIDER: [name], NP ? ?REFERRING DIAG:  M25.511,G89.29 (ICD-10-CM) - Chronic right shoulder pain  ? ?THERAPY DIAG:  ?Decreased right shoulder range of motion ? ?Chronic right shoulder pain ? ?Muscle weakness (generalized) ? ? ?ONSET DATE: 2022 ? ?SUBJECTIVE:                                                                                                                                                                                     ? ?SUBJECTIVE STATEMENT: ? ?Pt states she has recurrent R shoulder pain that happens every winter and goes away  during the summer. She has had steroid injection before in the past but has not had one recently. Pt states she has difficulty and pain with lifting and carrying items. She found significant relief with Voltaren gel 3-4x a day. Does not recall any specific injury to the area. This has been an issue in the past and she has had a cortisone injection into the joint previously that made a big difference. She states that it feels like a back ache but in the shoulder. Pt states it  the pain wraps from the shoulder across the R pec. Pt states that reaching behind her back hurts and she has some pain into the neck. Pt states the pain will bother her at night after sitting. She states that moving her back to rest, it will hurt her neck. She is unsure if her neck pain is related. Pt states that she lays with her arm outstretched bc of carpal tunnel. Denies cancer red flags. Pt states she is R side sleeper and back sleeper.  ? ?Pt states she walks her dogs a lot and they pull pretty hard on her shoulders. She did have a fall from them that is over 5 years ago.  ? ?Pt has bilat hand NT.  ? ?PERTINENT HISTORY: ?Carpal tunnel ? ?PAIN:  ?Are you having pain? No ? ?PRECAUTIONS: None ? ?WEIGHT BEARING RESTRICTIONS No ? ?FALLS:  ?Has patient fallen in last 6 months? No Number of falls: 0 ? ?LIVING ENVIRONMENT: ?Lives with: lives with their family ?Lives in: House/apartment ?Stairs: Yes; 5 steps to enter a two story home ?Has  following equipment at home: None ? ?OCCUPATION: ?retired ? ?PLOF: Independent ? ?PATIENT GOALS : Pt reports she would like to get pain to go away/not get worse.  ? ?OBJECTIVE:  ? ?DIAGNOSTIC FINDINGS:  ?IMPRESSION: ?Mild-to-moderate acromioclavicular and mild glenohumeral ?osteoarthritis. ?  ? ?PATIENT SURVEYS:  ?FOTO [id number] ?65 at D/C ?2 pts ? ?COGNITION: ? Overall cognitive status: Within functional limits for tasks assessed ?    ?SENSATION: ?Light touch impaired on L - bilat NT ? ?POSTURE: ?Rounded shoulders, anterior tip ? ?UPPER EXTREMITY ROM: WFL, symmetric bilaterally, pain with behind back reaching (BHB) on R ? ?C/S ROM: no recreation of pain into shoulder with C/S ROM but has neck pain ? ?UPPER EXTREMITY MMT: ? ?MMT Right ?06/25/2021 Left ?06/25/2021  ?Shoulder flexion 4/5 4/5  ?Shoulder extension    ?Shoulder abduction 4/5 4/5  ?Shoulder adduction    ?Shoulder internal rotation 4/5 4/5  ?Shoulder external rotation 4/5 4/5  ?(Blank rows = not tested) ? ?SHOULDER SPECIAL TESTS: ? Impingement tests: Hawkins/Kennedy impingement test: negative and Painful arc test: negative ? SLAP lesions: Biceps load test: negative ? ? Rotator cuff assessment: Empty can test: negative and Belly press test: negative ?  ? ?JOINT MOBILITY TESTING:  ?WFL ? ?PALPATION:  ?TTP and hypertonicity of R UT, infra, deltoid, pec ?  ?TODAY'S TREATMENT:  ? ?Exercises ?Standing Shoulder Posterior Capsule Stretch - 2 x daily - 7 x weekly - 1 sets - 3 reps ?Doorway Pec Stretch at 60 Degrees Abduction with Arm Straight - 2 x daily - 7 x weekly - 1 sets - 3 reps - 30 hold ?Standing Shoulder Row with Anchored Resistance - 1 x daily - 7 x weekly - 3 sets - 10 reps ? ? ? ?PATIENT EDUCATION: ?Education details: MOI, diagnosis, prognosis, anatomy, exercise progression, DOMS expectations, muscle firing,  envelope of function, HEP, POC ? ?Person educated: Patient ?Education method:  Explanation, Demonstration, Tactile cues, Verbal cues, and  Handouts ?Education comprehension: verbalized understanding, returned demonstration, verbal cues required, and tactile cues required ? ? ?HOME EXERCISE PROGRAM: ?Access Code: [email or web address] ?URL: [email or web address] ?Date: 06/25/2021 ?Prepared by: Zebedee Iba ? ?ASSESSMENT: ? ?CLINICAL IMPRESSION: ?Patient is a 76 y.o. female who was seen today for physical therapy evaluation and treatment for cc of R shoulder pain Pt's pain appears consistent with soft tissue irritation of RC as well as periscapular musculature that appears related to pt's report of static positioning. Pt's did respond well to manual therapy and exercise at initial session. Plan to continue with STM and strengthening as tolerated as pt does have appear to have decreased scapular strength. Clinical testing does not suggest cervical radiculopathy though results are unclear at this time given NT but no recreation of pain with C/S testing. Pt would benefit from continued skilled therapy in order to reach goals and maximize functional R UE strength and ROM for full return to PLOF. ? ? ? ?OBJECTIVE IMPAIRMENTS decreased ROM, decreased strength, hypomobility, increased muscle spasms, impaired flexibility, impaired sensation, impaired UE functional use, postural dysfunction, and pain.  ? ?ACTIVITY LIMITATIONS cleaning, community activity, laundry, yard work, and exercise .  ? ?PERSONAL FACTORS Age, Fitness, Past/current experiences, Time since onset of injury/illness/exacerbation, and 1-2 comorbidities:    are also affecting patient's functional outcome.  ? ? ?REHAB POTENTIAL: Good ? ?CLINICAL DECISION MAKING: Stable/uncomplicated ? ?EVALUATION COMPLEXITY: Low ? ? ?GOALS: ? ?SHORT TERM GOALS: Target date: 08/06/2021 ? ?Pt will become independent with HEP in order to demonstrate synthesis of PT education. ? ? ?Goal status: INITIAL ? ?2.  Pt will score at least 2 pt increase on FOTO to demonstrate functional improvement in MCII and pt perceived  function.   ? ?Goal status: INITIAL ? ?3.  Pt will be able to demonstrate BHB reach with R arm without pain in order to demonstrate functional improvement in UE/ function for self-care and house hold duties.  ? ?Goal status: INITIAL ? ? ?

## 2021-07-06 ENCOUNTER — Encounter (HOSPITAL_BASED_OUTPATIENT_CLINIC_OR_DEPARTMENT_OTHER): Payer: Self-pay | Admitting: Physical Therapy

## 2021-07-06 ENCOUNTER — Ambulatory Visit (HOSPITAL_BASED_OUTPATIENT_CLINIC_OR_DEPARTMENT_OTHER): Payer: Medicare Other | Admitting: Physical Therapy

## 2021-07-06 DIAGNOSIS — M25511 Pain in right shoulder: Secondary | ICD-10-CM | POA: Diagnosis not present

## 2021-07-06 DIAGNOSIS — G8929 Other chronic pain: Secondary | ICD-10-CM

## 2021-07-06 DIAGNOSIS — M25611 Stiffness of right shoulder, not elsewhere classified: Secondary | ICD-10-CM

## 2021-07-06 DIAGNOSIS — M6281 Muscle weakness (generalized): Secondary | ICD-10-CM | POA: Diagnosis not present

## 2021-07-06 NOTE — Therapy (Signed)
?OUTPATIENT PHYSICAL THERAPY SHOULDER Treatment  ? ? ?Patient Name: Bonnie Sanders ?MRN: [id number] ?DOB:January 09, 1946, 76 y.o., female ?Today's Date: 07/06/2021 ? ? PT End of Session - 07/06/21 0835   ? ? Visit Number 2   ? Number of Visits 16   ? Date for PT Re-Evaluation 09/23/21   ? Authorization Type BCBS MCR   ? PT Start Time 0800   ? PT Stop Time [phone]   ? PT Time Calculation (min) 43 min   ? Activity Tolerance Patient tolerated treatment well   ? Behavior During Therapy [facility] for tasks assessed/performed   ? ?  ?  ? ?  ? ? ? ?Past Medical History:  ?Diagnosis Date  ? Allergic rhinitis   ? Allergic rhinitis due to pollen 11/30/2010  ? Depression   ? Dyslipidemia   ? GERD (gastroesophageal reflux disease)   ? Heme positive stool   ? Hip pain, left 11/17/2013  ? Hyperlipidemia   ? Hypertension   ? Hypothyroid   ? Osteoporosis   ? Other screening mammogram   ? Primary osteoarthritis of left knee 07/25/2015  ? ?Past Surgical History:  ?Procedure Laterality Date  ? ABDOMINAL HYSTERECTOMY    ? COLONOSCOPY  2009  ? FOOT SURGERY    ? TOTAL KNEE ARTHROPLASTY    ? right foot  ? ?Patient Active Problem List  ? Diagnosis Date Noted  ? Sun-damaged skin 06/06/2021  ? Chronic right shoulder pain 06/04/2021  ? Fecal incontinence 01/28/2017  ? Grade I hemorrhoids 01/28/2017  ? Status post total right knee replacement using cement 07/25/2015  ? Midline low back pain with sciatica 12/07/2013  ? Colonic polyp 03/24/2012  ? Hypertension 11/30/2010  ? Osteoporosis 11/30/2010  ? Hypothyroid 10/25/2010  ? Hyperlipidemia with target low density lipoprotein (LDL) cholesterol less than 130 mg/dL 56/21/3086  ? Osteoarthritis, multiple sites 01/22/2010  ? Allergic rhinitis 05/25/2009  ? Vitamin B12 deficiency 05/25/2009  ? Atrophic vaginitis 04/10/2009  ? Gastro-esophageal reflux disease with esophagitis 10/20/2008  ? Tic disorder, unspecified 08/16/2008  ? ? ?PCP: [name], NP ? ?REFERRING PROVIDER: [name], NP ? ?REFERRING DIAG:  M25.511,G89.29 (ICD-10-CM) - Chronic right shoulder pain  ? ?THERAPY DIAG:  ?Decreased right shoulder range of motion ? ?Chronic right shoulder pain ? ?Muscle weakness (generalized) ? ? ?ONSET DATE: 2022 ? ?SUBJECTIVE:                                                                                                                                                                                     ? ?SUBJECTIVE STATEMENT: ? ?Patient reports she was a little sore today when she woke up. She  lost her sheet with her exercises. She has been doing 2 of the three. She reports she is sore for awhile with posterior capsule stretching.  ? ? ? ?Pt states she has recurrent R shoulder pain that happens every winter and goes away during the summer. She has had steroid injection before in the past but has not had one recently. Pt states she has difficulty and pain with lifting and carrying items. She found significant relief with Voltaren gel 3-4x a day. Does not recall any specific injury to the area. This has been an issue in the past and she has had a cortisone injection into the joint previously that made a big difference. She states that it feels like a back ache but in the shoulder. Pt states it  the pain wraps from the shoulder across the R pec. Pt states that reaching behind her back hurts and she has some pain into the neck. Pt states the pain will bother her at night after sitting. She states that moving her back to rest, it will hurt her neck. She is unsure if her neck pain is related. Pt states that she lays with her arm outstretched bc of carpal tunnel. Denies cancer red flags. Pt states she is R side sleeper and back sleeper.  ? ?Pt states she walks her dogs a lot and they pull pretty hard on her shoulders. She did have a fall from them that is over 5 years ago.  ? ?Pt has bilat hand NT.  ? ?PERTINENT HISTORY: ?Carpal tunnel ? ?PAIN:  ?Are you having pain? No ? ?PRECAUTIONS: None ? ?WEIGHT BEARING RESTRICTIONS  No ? ?FALLS:  ?Has patient fallen in last 6 months? No Number of falls: 0 ? ?LIVING ENVIRONMENT: ?Lives with: lives with their family ?Lives in: House/apartment ?Stairs: Yes; 5 steps to enter a two story home ?Has following equipment at home: None ? ?OCCUPATION: ?retired ? ?PLOF: Independent ? ?PATIENT GOALS : Pt reports she would like to get pain to go away/not get worse.  ? ?OBJECTIVE:  ? ?DIAGNOSTIC FINDINGS:  ?IMPRESSION: ?Mild-to-moderate acromioclavicular and mild glenohumeral ?osteoarthritis. ?  ? ?PATIENT SURVEYS:  ?FOTO [id number] ?65 at D/C ?2 pts ? ?COGNITION: ? Overall cognitive status: Within functional limits for tasks assessed ?    ?SENSATION: ?Light touch impaired on L - bilat NT ? ?POSTURE: ?Rounded shoulders, anterior tip ? ?UPPER EXTREMITY ROM: WFL, symmetric bilaterally, pain with behind back reaching (BHB) on R ? ?C/S ROM: no recreation of pain into shoulder with C/S ROM but has neck pain ? ?UPPER EXTREMITY MMT: ? ?MMT Right ?07/06/2021 Left ?07/06/2021  ?Shoulder flexion 4/5 4/5  ?Shoulder extension    ?Shoulder abduction 4/5 4/5  ?Shoulder adduction    ?Shoulder internal rotation 4/5 4/5  ?Shoulder external rotation 4/5 4/5  ?(Blank rows = not tested) ? ?SHOULDER SPECIAL TESTS: ? Impingement tests: Hawkins/Kennedy impingement test: negative and Painful arc test: negative ? SLAP lesions: Biceps load test: negative ? ? Rotator cuff assessment: Empty can test: negative and Belly press test: negative ?  ? ?JOINT MOBILITY TESTING:  ?WFL ? ?PALPATION:  ?TTP and hypertonicity of R UT, infra, deltoid, pec ?  ?TODAY'S TREATMENT:  ?07/06/2021 ?Removed posterior capsule strengthening because it caused pain  ? ?Manual: trigger point release to upper trap; sub-occipital release; Posterior capsule stretch of the shoulder.  ? ?Reviewed use of the thera-cane for home trigger point reelase. Reviewed benefits and risk of TPDN ? ?Upper trap stretch 3x20 sec hold for the  right ?Doorway stretch 2x30 sec hold.  ? ?Row red  2x10 sec hold  ?Shoulder extension red 2x10  ?Bilateral er 2x10  ? ? ?Eval ? ?Exercises ?Standing Shoulder Posterior Capsule Stretch - 2 x daily - 7 x weekly - 1 sets - 3 reps ?Doorway Pec Stretch at 60 Degrees Abduction with Arm Straight - 2 x daily - 7 x weekly - 1 sets - 3 reps - 30 hold ?Standing Shoulder Row with Anchored Resistance - 1 x daily - 7 x weekly - 3 sets - 10 reps ? ? ? ?PATIENT EDUCATION: ?Education details: MOI, diagnosis, prognosis, anatomy, exercise progression, DOMS expectations, muscle firing,  envelope of function, HEP, POC ? ?Person educated: Patient ?Education method: Explanation, Demonstration, Tactile cues, Verbal cues, and Handouts ?Education comprehension: verbalized understanding, returned demonstration, verbal cues required, and tactile cues required ? ? ?HOME EXERCISE PROGRAM: ?Access Code: [email or web address] ?URL: [email or web address] ?Date: 06/25/2021 ?Prepared by: Zebedee Iba ? ?ASSESSMENT: ? ?CLINICAL IMPRESSION: ?The patient tolerated treatment well. She again responded well to manual therapy. She has a large trigger point in her upper trap. Therapy performed trigger point release to this area. She reported improved pain. We reviewed benefits and risks of TPDN. She will take information on it an potentially do it next visit if needed. She was shown how to use the thera-cane for self trigger point release. Therapy expanded on her exercises and reviewed technique. She required mod cuing to not hike her shoulder. We reviewed her HEP.  ? ? ?OBJECTIVE IMPAIRMENTS decreased ROM, decreased strength, hypomobility, increased muscle spasms, impaired flexibility, impaired sensation, impaired UE functional use, postural dysfunction, and pain.  ? ?ACTIVITY LIMITATIONS cleaning, community activity, laundry, yard work, and exercise .  ? ?PERSONAL FACTORS Age, Fitness, Past/current experiences, Time since onset of injury/illness/exacerbation, and 1-2 comorbidities:    are also affecting  patient's functional outcome.  ? ? ?REHAB POTENTIAL: Good ? ?CLINICAL DECISION MAKING: Stable/uncomplicated ? ?EVALUATION COMPLEXITY: Low ? ? ?GOALS: ? ?SHORT TERM GOALS: Target date: 08/17/2021 ? ?Pt will become indepe

## 2021-07-11 ENCOUNTER — Encounter (HOSPITAL_BASED_OUTPATIENT_CLINIC_OR_DEPARTMENT_OTHER): Payer: Self-pay | Admitting: Physical Therapy

## 2021-07-11 ENCOUNTER — Ambulatory Visit (HOSPITAL_BASED_OUTPATIENT_CLINIC_OR_DEPARTMENT_OTHER): Payer: Medicare Other | Attending: Family Medicine | Admitting: Physical Therapy

## 2021-07-11 DIAGNOSIS — M6281 Muscle weakness (generalized): Secondary | ICD-10-CM | POA: Insufficient documentation

## 2021-07-11 DIAGNOSIS — M25611 Stiffness of right shoulder, not elsewhere classified: Secondary | ICD-10-CM | POA: Diagnosis not present

## 2021-07-11 DIAGNOSIS — M25511 Pain in right shoulder: Secondary | ICD-10-CM | POA: Insufficient documentation

## 2021-07-11 DIAGNOSIS — G8929 Other chronic pain: Secondary | ICD-10-CM | POA: Insufficient documentation

## 2021-07-11 NOTE — Therapy (Signed)
?OUTPATIENT PHYSICAL THERAPY SHOULDER Treatment  ? ? ?Patient Name: Bonnie Sanders ?MRN: [id number] ?DOB:26-Feb-1946, 76 y.o., female ?Today's Date: 07/12/2021 ? ? PT End of Session - 07/11/21 1214   ? ? Visit Number 3   ? Number of Visits 16   ? Date for PT Re-Evaluation 09/23/21   ? Authorization Type BCBS MCR   ? PT Start Time 1100   ? PT Stop Time 1142   ? PT Time Calculation (min) 42 min   ? Activity Tolerance Patient tolerated treatment well   ? Behavior During Therapy [facility] for tasks assessed/performed   ? ?  ?  ? ?  ? ? ? ? ?Past Medical History:  ?Diagnosis Date  ? Allergic rhinitis   ? Allergic rhinitis due to pollen 11/30/2010  ? Depression   ? Dyslipidemia   ? GERD (gastroesophageal reflux disease)   ? Heme positive stool   ? Hip pain, left 11/17/2013  ? Hyperlipidemia   ? Hypertension   ? Hypothyroid   ? Osteoporosis   ? Other screening mammogram   ? Primary osteoarthritis of left knee 07/25/2015  ? ?Past Surgical History:  ?Procedure Laterality Date  ? ABDOMINAL HYSTERECTOMY    ? COLONOSCOPY  2009  ? FOOT SURGERY    ? TOTAL KNEE ARTHROPLASTY    ? right foot  ? ?Patient Active Problem List  ? Diagnosis Date Noted  ? Sun-damaged skin 06/06/2021  ? Chronic right shoulder pain 06/04/2021  ? Fecal incontinence 01/28/2017  ? Grade I hemorrhoids 01/28/2017  ? Status post total right knee replacement using cement 07/25/2015  ? Midline low back pain with sciatica 12/07/2013  ? Colonic polyp 03/24/2012  ? Hypertension 11/30/2010  ? Osteoporosis 11/30/2010  ? Hypothyroid 10/25/2010  ? Hyperlipidemia with target low density lipoprotein (LDL) cholesterol less than 130 mg/dL 22/48/2500  ? Osteoarthritis, multiple sites 01/22/2010  ? Allergic rhinitis 05/25/2009  ? Vitamin B12 deficiency 05/25/2009  ? Atrophic vaginitis 04/10/2009  ? Gastro-esophageal reflux disease with esophagitis 10/20/2008  ? Tic disorder, unspecified 08/16/2008  ? ? ?PCP: [name], NP ? ?REFERRING PROVIDER: [name], NP ? ?REFERRING DIAG:  M25.511,G89.29 (ICD-10-CM) - Chronic right shoulder pain  ? ?THERAPY DIAG:  ?Decreased right shoulder range of motion ? ?Chronic right shoulder pain ? ?Muscle weakness (generalized) ? ? ?ONSET DATE: 2022 ? ?SUBJECTIVE:                                                                                                                                                                                     ? ?SUBJECTIVE STATEMENT: ? ?The patient reports that yesterday she had no pain at all. She  reports she tried the upper trap stretch and that caused her some pain. She has been working on her exercises at home .  ? ? ?Pt states she has recurrent R shoulder pain that happens every winter and goes away during the summer. She has had steroid injection before in the past but has not had one recently. Pt states she has difficulty and pain with lifting and carrying items. She found significant relief with Voltaren gel 3-4x a day. Does not recall any specific injury to the area. This has been an issue in the past and she has had a cortisone injection into the joint previously that made a big difference. She states that it feels like a back ache but in the shoulder. Pt states it  the pain wraps from the shoulder across the R pec. Pt states that reaching behind her back hurts and she has some pain into the neck. Pt states the pain will bother her at night after sitting. She states that moving her back to rest, it will hurt her neck. She is unsure if her neck pain is related. Pt states that she lays with her arm outstretched bc of carpal tunnel. Denies cancer red flags. Pt states she is R side sleeper and back sleeper.  ? ?Pt states she walks her dogs a lot and they pull pretty hard on her shoulders. She did have a fall from them that is over 5 years ago.  ? ?Pt has bilat hand NT.  ? ?PERTINENT HISTORY: ?Carpal tunnel ? ?PAIN:  ?Are you having pain? No ? ?PRECAUTIONS: None ? ?WEIGHT BEARING RESTRICTIONS No ? ?FALLS:  ?Has patient  fallen in last 6 months? No Number of falls: 0 ? ?LIVING ENVIRONMENT: ?Lives with: lives with their family ?Lives in: House/apartment ?Stairs: Yes; 5 steps to enter a two story home ?Has following equipment at home: None ? ?OCCUPATION: ?retired ? ?PLOF: Independent ? ?PATIENT GOALS : Pt reports she would like to get pain to go away/not get worse.  ? ?OBJECTIVE:  ? ?DIAGNOSTIC FINDINGS:  ?IMPRESSION: ?Mild-to-moderate acromioclavicular and mild glenohumeral ?osteoarthritis. ?  ? ?PATIENT SURVEYS:  ?FOTO 1 ?65 at D/C ?2 pts ? ?COGNITION: ? Overall cognitive status: Within functional limits for tasks assessed ?    ?SENSATION: ?Light touch impaired on L - bilat NT ? ?POSTURE: ?Rounded shoulders, anterior tip ? ?UPPER EXTREMITY ROM: WFL, symmetric bilaterally, pain with behind back reaching (BHB) on R ? ?C/S ROM: no recreation of pain into shoulder with C/S ROM but has neck pain ? ?UPPER EXTREMITY MMT: ? ?MMT Right ?07/12/2021 Left ?07/12/2021  ?Shoulder flexion 4/5 4/5  ?Shoulder extension    ?Shoulder abduction 4/5 4/5  ?Shoulder adduction    ?Shoulder internal rotation 4/5 4/5  ?Shoulder external rotation 4/5 4/5  ?(Blank rows = not tested) ? ?SHOULDER SPECIAL TESTS: ? Impingement tests: Hawkins/Kennedy impingement test: negative and Painful arc test: negative ? SLAP lesions: Biceps load test: negative ? ? Rotator cuff assessment: Empty can test: negative and Belly press test: negative ?  ? ?JOINT MOBILITY TESTING:  ?WFL ? ?PALPATION:  ?TTP and hypertonicity of R UT, infra, deltoid, pec ?  ?TODAY'S TREATMENT:  ?4/5 ?Reviewed upper trap stretch for HEP. Sh required cuing to do it properly. She required some cuing. She was advised if it hurts not to do it .  ? ?Manual: trigger point release to upper trap; sub-occipital release; Posterior capsule stretch of the shoulder.  ? ?Reviewed use of the thera-cane for home trigger  point release. The patient has it at home but doesn't feel like she is using it correctly ? ?.  ?Row  red 2x10 sec hold  ?Shoulder extension red 2x10  ? ?Supine wand flexion 2x10  ? ?Side lying ER  ? ?Updated and reviewed HEP ? ? ? ? ?07/06/2021 ?Removed posterior capsule strengthening because it caused pain  ? ?Manual: trigger point release to upper trap; sub-occipital release; Posterior capsule stretch of the shoulder.  ? ?Reviewed use of the thera-cane for home trigger point reelase. Reviewed benefits and risk of TPDN ? ?Upper trap stretch 3x20 sec hold for the right ?Doorway stretch 2x30 sec hold.  ? ?Row red 2x10 sec hold  ?Shoulder extension red 2x10  ?Bilateral er 2x10  ? ? ?Eval ? ?Exercises ?Standing Shoulder Posterior Capsule Stretch - 2 x daily - 7 x weekly - 1 sets - 3 reps ?Doorway Pec Stretch at 60 Degrees Abduction with Arm Straight - 2 x daily - 7 x weekly - 1 sets - 3 reps - 30 hold ?Standing Shoulder Row with Anchored Resistance - 1 x daily - 7 x weekly - 3 sets - 10 reps ? ? ? ?PATIENT EDUCATION: ?Education details: MOI, diagnosis, prognosis, anatomy, exercise progression, DOMS expectations, muscle firing,  envelope of function, HEP, POC ? ?Person educated: Patient ?Education method: Explanation, Demonstration, Tactile cues, Verbal cues, and Handouts ?Education comprehension: verbalized understanding, returned demonstration, verbal cues required, and tactile cues required ? ? ?HOME EXERCISE PROGRAM: ?Access Code: [email or web address] ?URL: [email or web address] ?Date: 06/25/2021 ?Prepared by: Zebedee IbaAlan Zhou ? ?ASSESSMENT: ? ?CLINICAL IMPRESSION: ?The patient continues to have a large trigger point in her upper trap but it is smaller and less reactive then it was in the previous visit. We began over the head activity for home. She was given an updated HEP. We reviewed technique with exercises for home. She has had some days with no pain at all. She reports it does come back. She was advised that that is common. She should begin to have mre good days as she goes forward. We will likely review gym  exercises next time.  ? ?OBJECTIVE IMPAIRMENTS decreased ROM, decreased strength, hypomobility, increased muscle spasms, impaired flexibility, impaired sensation, impaired UE functional use, postural dysfunction, and

## 2021-07-12 ENCOUNTER — Encounter (HOSPITAL_BASED_OUTPATIENT_CLINIC_OR_DEPARTMENT_OTHER): Payer: Self-pay | Admitting: Physical Therapy

## 2021-07-17 ENCOUNTER — Other Ambulatory Visit (HOSPITAL_BASED_OUTPATIENT_CLINIC_OR_DEPARTMENT_OTHER): Payer: Self-pay

## 2021-07-17 ENCOUNTER — Encounter (HOSPITAL_BASED_OUTPATIENT_CLINIC_OR_DEPARTMENT_OTHER): Payer: Self-pay | Admitting: Family Medicine

## 2021-07-17 ENCOUNTER — Ambulatory Visit (INDEPENDENT_AMBULATORY_CARE_PROVIDER_SITE_OTHER): Payer: Medicare Other | Admitting: Family Medicine

## 2021-07-17 VITALS — BP 106/53 | HR 74 | Temp 97.7°F | Ht 61.0 in | Wt 117.0 lb

## 2021-07-17 DIAGNOSIS — M25511 Pain in right shoulder: Secondary | ICD-10-CM | POA: Diagnosis not present

## 2021-07-17 DIAGNOSIS — G8929 Other chronic pain: Secondary | ICD-10-CM | POA: Diagnosis not present

## 2021-07-17 NOTE — Progress Notes (Signed)
? ? ?  Procedures performed today:   ? ?None. ? ?Independent interpretation of notes and tests performed by another provider:  ? ?None. ? ?Brief History, Exam, Impression, and Recommendations:   ? ?BP (!) 106/53   Pulse 74   Temp 97.7 ?F (36.5 ?C)   Ht 5\' 1"  (1.549 m)   Wt 117 lb (53.1 kg)   SpO2 100%   BMI 22.11 kg/m?  ? ?Chronic right shoulder pain ?Patient reports that she has been doing better overall with PT, HEP as per PT. Feels that she has had improvement in symptoms, still occasionally will have some increased pain but generally having more good days than bad days. ?Has upcoming appts with PT, next one later this week. Pain still over posterior shoulder, around shoulder blade. ?On exam, she does have some tenderness to palpation along medial aspect of scapula through rhomboids, less tenderness than last visit ?Will have patient continue with physical therapy, home exercise program as per PT ?Can continue with OTC medications as needed to help with pain control ?Plan for follow-up in about 3 months to review progress with right shoulder pain ? ? ?___________________________________________ ?Floyed Masoud de [place], MD, ABFM, CAQSM ?Primary Care and Sports Medicine ?[place] ?

## 2021-07-17 NOTE — Assessment & Plan Note (Signed)
Patient reports that she has been doing better overall with PT, HEP as per PT. Feels that she has had improvement in symptoms, still occasionally will have some increased pain but generally having more good days than bad days. ?Has upcoming appts with PT, next one later this week. Pain still over posterior shoulder, around shoulder blade. ?On exam, she does have some tenderness to palpation along medial aspect of scapula through rhomboids, less tenderness than last visit ?Will have patient continue with physical therapy, home exercise program as per PT ?Can continue with OTC medications as needed to help with pain control ?Plan for follow-up in about 3 months to review progress with right shoulder pain ?

## 2021-07-17 NOTE — Patient Instructions (Signed)

## 2021-07-18 ENCOUNTER — Other Ambulatory Visit (HOSPITAL_BASED_OUTPATIENT_CLINIC_OR_DEPARTMENT_OTHER): Payer: Self-pay

## 2021-07-19 ENCOUNTER — Encounter (HOSPITAL_BASED_OUTPATIENT_CLINIC_OR_DEPARTMENT_OTHER): Payer: Self-pay | Admitting: Physical Therapy

## 2021-07-19 ENCOUNTER — Ambulatory Visit (HOSPITAL_BASED_OUTPATIENT_CLINIC_OR_DEPARTMENT_OTHER): Payer: Medicare Other | Admitting: Physical Therapy

## 2021-07-19 DIAGNOSIS — M25611 Stiffness of right shoulder, not elsewhere classified: Secondary | ICD-10-CM | POA: Diagnosis not present

## 2021-07-19 DIAGNOSIS — M25511 Pain in right shoulder: Secondary | ICD-10-CM | POA: Diagnosis not present

## 2021-07-19 DIAGNOSIS — G8929 Other chronic pain: Secondary | ICD-10-CM | POA: Diagnosis not present

## 2021-07-19 DIAGNOSIS — M6281 Muscle weakness (generalized): Secondary | ICD-10-CM

## 2021-07-19 NOTE — Therapy (Signed)
?OUTPATIENT PHYSICAL THERAPY SHOULDER Treatment  ? ? ?Patient Name: Bonnie Sanders ?MRN: [id number] ?DOB:March 06, 1946, 76 y.o., female ?Today's Date: 07/19/2021 ? ? PT End of Session - 07/19/21 1206   ? ? Visit Number 4   ? Number of Visits 16   ? Date for PT Re-Evaluation 09/23/21   ? Authorization Type BCBS MCR   ? PT Start Time 1015   ? PT Stop Time 1058   ? PT Time Calculation (min) 43 min   ? Activity Tolerance Patient tolerated treatment well   ? Behavior During Therapy [facility] for tasks assessed/performed   ? ?  ?  ? ?  ? ? ? ? ? ?Past Medical History:  ?Diagnosis Date  ? Allergic rhinitis   ? Allergic rhinitis due to pollen 11/30/2010  ? Depression   ? Dyslipidemia   ? GERD (gastroesophageal reflux disease)   ? Heme positive stool   ? Hip pain, left 11/17/2013  ? Hyperlipidemia   ? Hypertension   ? Hypothyroid   ? Osteoporosis   ? Other screening mammogram   ? Primary osteoarthritis of left knee 07/25/2015  ? ?Past Surgical History:  ?Procedure Laterality Date  ? ABDOMINAL HYSTERECTOMY    ? COLONOSCOPY  2009  ? FOOT SURGERY    ? TOTAL KNEE ARTHROPLASTY    ? right foot  ? ?Patient Active Problem List  ? Diagnosis Date Noted  ? Sun-damaged skin 06/06/2021  ? Chronic right shoulder pain 06/04/2021  ? Fecal incontinence 01/28/2017  ? Grade I hemorrhoids 01/28/2017  ? Status post total right knee replacement using cement 07/25/2015  ? Midline low back pain with sciatica 12/07/2013  ? Colonic polyp 03/24/2012  ? Hypertension 11/30/2010  ? Osteoporosis 11/30/2010  ? Hypothyroid 10/25/2010  ? Hyperlipidemia with target low density lipoprotein (LDL) cholesterol less than 130 mg/dL 10/25/2010  ? Osteoarthritis, multiple sites 01/22/2010  ? Allergic rhinitis 05/25/2009  ? Vitamin B12 deficiency 05/25/2009  ? Atrophic vaginitis 04/10/2009  ? Gastro-esophageal reflux disease with esophagitis 10/20/2008  ? Tic disorder, unspecified 08/16/2008  ? ? ?PCP: Orma Render, NP ? ?REFERRING PROVIDER: Orma Render, NP ? ?REFERRING DIAG:  M25.511,G89.29 (ICD-10-CM) - Chronic right shoulder pain  ? ?THERAPY DIAG:  ?Decreased right shoulder range of motion ? ?Chronic right shoulder pain ? ?Muscle weakness (generalized) ? ? ?ONSET DATE: 2022 ? ?SUBJECTIVE:                                                                                                                                                                                     ? ?SUBJECTIVE STATEMENT: ? ?The patient reports that yesterday she had no pain at all.  She reports she tried the upper trap stretch and that caused her some pain. She has been working on her exercises at home .  ? ? ?Pt states she has recurrent R shoulder pain that happens every winter and goes away during the summer. She has had steroid injection before in the past but has not had one recently. Pt states she has difficulty and pain with lifting and carrying items. She found significant relief with Voltaren gel 3-4x a day. Does not recall any specific injury to the area. This has been an issue in the past and she has had a cortisone injection into the joint previously that made a big difference. She states that it feels like a back ache but in the shoulder. Pt states it  the pain wraps from the shoulder across the R pec. Pt states that reaching behind her back hurts and she has some pain into the neck. Pt states the pain will bother her at night after sitting. She states that moving her back to rest, it will hurt her neck. She is unsure if her neck pain is related. Pt states that she lays with her arm outstretched bc of carpal tunnel. Denies cancer red flags. Pt states she is R side sleeper and back sleeper.  ? ?Pt states she walks her dogs a lot and they pull pretty hard on her shoulders. She did have a fall from them that is over 5 years ago.  ? ?Pt has bilat hand NT.  ? ?PERTINENT HISTORY: ?Carpal tunnel ? ?PAIN:  ?Are you having pain? No ? ?PRECAUTIONS: None ? ?WEIGHT BEARING RESTRICTIONS No ? ?FALLS:  ?Has patient  fallen in last 6 months? No Number of falls: 0 ? ?LIVING ENVIRONMENT: ?Lives with: lives with their family ?Lives in: House/apartment ?Stairs: Yes; 5 steps to enter a two story home ?Has following equipment at home: None ? ?OCCUPATION: ?retired ? ?PLOF: Independent ? ?PATIENT GOALS : Pt reports she would like to get pain to go away/not get worse.  ? ?OBJECTIVE:  ? ?DIAGNOSTIC FINDINGS:  ?IMPRESSION: ?Mild-to-moderate acromioclavicular and mild glenohumeral ?osteoarthritis. ?  ? ?PATIENT SURVEYS:  ?FOTO 28 ?65 at D/C ?2 pts ? ?COGNITION: ? Overall cognitive status: Within functional limits for tasks assessed ?    ?SENSATION: ?Light touch impaired on L - bilat NT ? ?POSTURE: ?Rounded shoulders, anterior tip ? ?UPPER EXTREMITY ROM: WFL, symmetric bilaterally, pain with behind back reaching (BHB) on R ? ?C/S ROM: no recreation of pain into shoulder with C/S ROM but has neck pain ? ?UPPER EXTREMITY MMT: ? ?MMT Right ?07/19/2021 Left ?07/19/2021  ?Shoulder flexion 4/5 4/5  ?Shoulder extension    ?Shoulder abduction 4/5 4/5  ?Shoulder adduction    ?Shoulder internal rotation 4/5 4/5  ?Shoulder external rotation 4/5 4/5  ?(Blank rows = not tested) ? ?SHOULDER SPECIAL TESTS: ? Impingement tests: Hawkins/Kennedy impingement test: negative and Painful arc test: negative ? SLAP lesions: Biceps load test: negative ? ? Rotator cuff assessment: Empty can test: negative and Belly press test: negative ?  ? ?JOINT MOBILITY TESTING:  ?WFL ? ?PALPATION:  ?TTP and hypertonicity of R UT, infra, deltoid, pec ?  ?TODAY'S TREATMENT:  ?4/13 ?Manual: trigger point release to upper trap; sub-occipital release; Posterior capsule stretch of the shoulder.  ?Reviewed use of the thera-cane for home trigger point release. ? ?Row green but hiked shoulder towards the end kept with red for HEP  ? 2x10 sec  ?Shoulder extension red 2x10  ? ?Supine wand flexion  2x10  ? ?Side lying ER 2x10  ? ?Supine ABC x1 ( added to HEP) ? ?Removed UT stretch. Stretch was  causing her pain and she wasn't doing it corectly depsite eduction and training  ? ?Reviewed pec stretching in the doorway high vs low. 2x20 sec hold.  ? ? ?4/5 ?Reviewed upper trap stretch for HEP. Sh required cuing to do it properly. She required some cuing. She was advised if it hurts not to do it .  ? ?Manual: trigger point release to upper trap; sub-occipital release; Posterior capsule stretch of the shoulder.  ? ?Reviewed use of the thera-cane for home trigger point release. The patient has it at home but doesn't feel like she is using it correctly ? ?.  ?Row red 2x10 sec hold  ?Shoulder extension red 2x10  ? ?Supine wand flexion 2x10  ? ?Side lying ER  ? ?Updated and reviewed HEP ? ? ? ? ?07/06/2021 ?Removed posterior capsule strengthening because it caused pain  ? ?Manual: trigger point release to upper trap; sub-occipital release; Posterior capsule stretch of the shoulder.  ? ?Reviewed use of the thera-cane for home trigger point reelase. Reviewed benefits and risk of TPDN ? ?Upper trap stretch 3x20 sec hold for the right ?Doorway stretch 2x30 sec hold.  ? ?Row red 2x10 sec hold  ?Shoulder extension red 2x10  ?Bilateral er 2x10  ? ? ?Eval ? ?Exercises ?Standing Shoulder Posterior Capsule Stretch - 2 x daily - 7 x weekly - 1 sets - 3 reps ?Doorway Pec Stretch at 60 Degrees Abduction with Arm Straight - 2 x daily - 7 x weekly - 1 sets - 3 reps - 30 hold ?Standing Shoulder Row with Anchored Resistance - 1 x daily - 7 x weekly - 3 sets - 10 reps ? ? ? ?PATIENT EDUCATION: ?Education details: MOI, diagnosis, prognosis, anatomy, exercise progression, DOMS expectations, muscle firing,  envelope of function, HEP, POC ? ?Person educated: Patient ?Education method: Explanation, Demonstration, Tactile cues, Verbal cues, and Handouts ?Education comprehension: verbalized understanding, returned demonstration, verbal cues required, and tactile cues required ? ? ?HOME EXERCISE PROGRAM: ?Access Code: [email or web address] ?URL:  [email or web address] ?Date: 06/25/2021 ?Prepared by: Daleen Bo ? ?ASSESSMENT: ? ?CLINICAL IMPRESSION: ?The patient continues to have a trigger point but again it is less reactive. We continue to work

## 2021-07-25 ENCOUNTER — Ambulatory Visit (HOSPITAL_BASED_OUTPATIENT_CLINIC_OR_DEPARTMENT_OTHER): Payer: Medicare Other | Admitting: Physical Therapy

## 2021-07-25 ENCOUNTER — Encounter (HOSPITAL_BASED_OUTPATIENT_CLINIC_OR_DEPARTMENT_OTHER): Payer: Self-pay | Admitting: Physical Therapy

## 2021-07-25 DIAGNOSIS — G8929 Other chronic pain: Secondary | ICD-10-CM

## 2021-07-25 DIAGNOSIS — M25611 Stiffness of right shoulder, not elsewhere classified: Secondary | ICD-10-CM | POA: Diagnosis not present

## 2021-07-25 DIAGNOSIS — M6281 Muscle weakness (generalized): Secondary | ICD-10-CM

## 2021-07-25 DIAGNOSIS — M25511 Pain in right shoulder: Secondary | ICD-10-CM | POA: Diagnosis not present

## 2021-07-25 NOTE — Therapy (Signed)
?OUTPATIENT PHYSICAL THERAPY SHOULDER Treatment  ? ? ?Patient Name: Bonnie Sanders ?MRN: [id number] ?DOB:1945-09-23, 76 y.o., female ?Today's Date: 07/25/2021 ? ? PT End of Session - 07/25/21 1042   ? ? Visit Number 5   ? Number of Visits 16   ? Date for PT Re-Evaluation 09/23/21   ? Authorization Type BCBS MCR   ? PT Start Time 1015   ? PT Stop Time 1056   ? PT Time Calculation (min) 41 min   ? Activity Tolerance Patient tolerated treatment well   ? Behavior During Therapy [facility] for tasks assessed/performed   ? ?  ?  ? ?  ? ? ? ? ? ? ?Past Medical History:  ?Diagnosis Date  ? Allergic rhinitis   ? Allergic rhinitis due to pollen 11/30/2010  ? Depression   ? Dyslipidemia   ? GERD (gastroesophageal reflux disease)   ? Heme positive stool   ? Hip pain, left 11/17/2013  ? Hyperlipidemia   ? Hypertension   ? Hypothyroid   ? Osteoporosis   ? Other screening mammogram   ? Primary osteoarthritis of left knee 07/25/2015  ? ?Past Surgical History:  ?Procedure Laterality Date  ? ABDOMINAL HYSTERECTOMY    ? COLONOSCOPY  2009  ? FOOT SURGERY    ? TOTAL KNEE ARTHROPLASTY    ? right foot  ? ?Patient Active Problem List  ? Diagnosis Date Noted  ? Sun-damaged skin 06/06/2021  ? Chronic right shoulder pain 06/04/2021  ? Fecal incontinence 01/28/2017  ? Grade I hemorrhoids 01/28/2017  ? Status post total right knee replacement using cement 07/25/2015  ? Midline low back pain with sciatica 12/07/2013  ? Colonic polyp 03/24/2012  ? Hypertension 11/30/2010  ? Osteoporosis 11/30/2010  ? Hypothyroid 10/25/2010  ? Hyperlipidemia with target low density lipoprotein (LDL) cholesterol less than 130 mg/dL 10/25/2010  ? Osteoarthritis, multiple sites 01/22/2010  ? Allergic rhinitis 05/25/2009  ? Vitamin B12 deficiency 05/25/2009  ? Atrophic vaginitis 04/10/2009  ? Gastro-esophageal reflux disease with esophagitis 10/20/2008  ? Tic disorder, unspecified 08/16/2008  ? ? ?PCP: Orma Render, NP ? ?REFERRING PROVIDER: Orma Render, NP ? ?REFERRING  DIAG: M25.511,G89.29 (ICD-10-CM) - Chronic right shoulder pain  ? ?THERAPY DIAG:  ?Decreased right shoulder range of motion ? ?Chronic right shoulder pain ? ?Muscle weakness (generalized) ? ? ?ONSET DATE: 2022 ? ?SUBJECTIVE:                                                                                                                                                                                     ? ?SUBJECTIVE STATEMENT: ? ?The patient reports she has had very little pain. She  has been working on her exercises.  ? ?Pt states she has recurrent R shoulder pain that happens every winter and goes away during the summer. She has had steroid injection before in the past but has not had one recently. Pt states she has difficulty and pain with lifting and carrying items. She found significant relief with Voltaren gel 3-4x a day. Does not recall any specific injury to the area. This has been an issue in the past and she has had a cortisone injection into the joint previously that made a big difference. She states that it feels like a back ache but in the shoulder. Pt states it  the pain wraps from the shoulder across the R pec. Pt states that reaching behind her back hurts and she has some pain into the neck. Pt states the pain will bother her at night after sitting. She states that moving her back to rest, it will hurt her neck. She is unsure if her neck pain is related. Pt states that she lays with her arm outstretched bc of carpal tunnel. Denies cancer red flags. Pt states she is R side sleeper and back sleeper.  ? ?Pt states she walks her dogs a lot and they pull pretty hard on her shoulders. She did have a fall from them that is over 5 years ago.  ? ?Pt has bilat hand NT.  ? ?PERTINENT HISTORY: ?Carpal tunnel ? ?PAIN:  ?Are you having pain? No ? ?PRECAUTIONS: None ? ?WEIGHT BEARING RESTRICTIONS No ? ?FALLS:  ?Has patient fallen in last 6 months? No Number of falls: 0 ? ?LIVING ENVIRONMENT: ?Lives with: lives with  their family ?Lives in: House/apartment ?Stairs: Yes; 5 steps to enter a two story home ?Has following equipment at home: None ? ?OCCUPATION: ?retired ? ?PLOF: Independent ? ?PATIENT GOALS : Pt reports she would like to get pain to go away/not get worse.  ? ?OBJECTIVE:  ? ?DIAGNOSTIC FINDINGS:  ?IMPRESSION: ?Mild-to-moderate acromioclavicular and mild glenohumeral ?osteoarthritis. ?  ? ?PATIENT SURVEYS:  ?FOTO 38 ?65 at D/C ?2 pts ? ?COGNITION: ? Overall cognitive status: Within functional limits for tasks assessed ?    ?SENSATION: ?Light touch impaired on L - bilat NT ? ?POSTURE: ?Rounded shoulders, anterior tip ? ?UPPER EXTREMITY ROM: WFL, symmetric bilaterally, pain with behind back reaching (BHB) on R ? ?C/S ROM: no recreation of pain into shoulder with C/S ROM but has neck pain ? ?UPPER EXTREMITY MMT: ? ?MMT Right ?07/25/2021 Left ?07/25/2021  ?Shoulder flexion 4/5 4/5  ?Shoulder extension    ?Shoulder abduction 4/5 4/5  ?Shoulder adduction    ?Shoulder internal rotation 4/5 4/5  ?Shoulder external rotation 4/5 4/5  ?(Blank rows = not tested) ? ?SHOULDER SPECIAL TESTS: ? Impingement tests: Hawkins/Kennedy impingement test: negative and Painful arc test: negative ? SLAP lesions: Biceps load test: negative ? ? Rotator cuff assessment: Empty can test: negative and Belly press test: negative ?  ? ?JOINT MOBILITY TESTING:  ?WFL ? ?PALPATION:  ?TTP and hypertonicity of R UT, infra, deltoid, pec ?  ?TODAY'S TREATMENT:  ?4/19 ?Manual: trigger point release to upper trap; sub-occipital release; Posterior capsule stretch of the shoulder.  ?Reviewed use of the thera-cane for home trigger point release. ? ?Supine ABC x1 2lbs  ? ?Side lying ER 2x10 1 lb ? ? ?Row green better technique today  ? 2x10 sec  ?Shoulder extension Green 2x10  ? ? ? ?4/13 ?Manual: trigger point release to upper trap; sub-occipital release; Posterior capsule stretch of  the shoulder.  ?Reviewed use of the thera-cane for home trigger point  release. ? ?Row green but hiked shoulder towards the end kept with red for HEP  ? 2x10 sec  ?Shoulder extension red 2x10  ? ?Supine wand flexion 2x10  ? ?Side lying ER 2x10  ? ?Supine ABC x1 ( added to HEP) ? ?Removed UT stretch. Stretch was causing her pain and she wasn't doing it corectly depsite eduction and training  ? ?Reviewed pec stretching in the doorway high vs low. 2x20 sec hold.  ? ? ?4/5 ?Reviewed upper trap stretch for HEP. Sh required cuing to do it properly. She required some cuing. She was advised if it hurts not to do it .  ? ?Manual: trigger point release to upper trap; sub-occipital release; Posterior capsule stretch of the shoulder.  ? ?Reviewed use of the thera-cane for home trigger point release. The patient has it at home but doesn't feel like she is using it correctly ? ?.  ?Row red 2x10 sec hold  ?Shoulder extension red 2x10  ? ?Supine wand flexion 2x10  ? ?Side lying ER  ? ?Updated and reviewed HEP ? ? ? ? ?07/06/2021 ?Removed posterior capsule strengthening because it caused pain  ? ?Manual: trigger point release to upper trap; sub-occipital release; Posterior capsule stretch of the shoulder.  ? ?Reviewed use of the thera-cane for home trigger point reelase. Reviewed benefits and risk of TPDN ? ?Upper trap stretch 3x20 sec hold for the right ?Doorway stretch 2x30 sec hold.  ? ?Row red 2x10 sec hold  ?Shoulder extension red 2x10  ?Bilateral er 2x10  ? ? ?Eval ? ?Exercises ?Standing Shoulder Posterior Capsule Stretch - 2 x daily - 7 x weekly - 1 sets - 3 reps ?Doorway Pec Stretch at 60 Degrees Abduction with Arm Straight - 2 x daily - 7 x weekly - 1 sets - 3 reps - 30 hold ?Standing Shoulder Row with Anchored Resistance - 1 x daily - 7 x weekly - 3 sets - 10 reps ? ? ? ?PATIENT EDUCATION: ?Education details: MOI, diagnosis, prognosis, anatomy, exercise progression, DOMS expectations, muscle firing,  envelope of function, HEP, POC ? ?Person educated: Patient ?Education method: Explanation,  Demonstration, Tactile cues, Verbal cues, and Handouts ?Education comprehension: verbalized understanding, returned demonstration, verbal cues required, and tactile cues required ? ? ?HOME EXERCISE PROGRAM: ?Access Code: P

## 2021-07-27 ENCOUNTER — Other Ambulatory Visit (HOSPITAL_BASED_OUTPATIENT_CLINIC_OR_DEPARTMENT_OTHER): Payer: Self-pay

## 2021-07-27 ENCOUNTER — Other Ambulatory Visit: Payer: Self-pay | Admitting: Nurse Practitioner

## 2021-07-27 MED ORDER — FLUTICASONE PROPIONATE 50 MCG/ACT NA SUSP
2.0000 | Freq: Every day | NASAL | 3 refills | Status: DC
  Filled 2021-07-27: qty 16, 30d supply, fill #0
  Filled 2021-11-27: qty 16, 30d supply, fill #1
  Filled 2022-01-18: qty 16, 30d supply, fill #2
  Filled 2022-02-15: qty 16, 30d supply, fill #3

## 2021-07-30 ENCOUNTER — Other Ambulatory Visit (HOSPITAL_BASED_OUTPATIENT_CLINIC_OR_DEPARTMENT_OTHER): Payer: Self-pay

## 2021-07-31 ENCOUNTER — Other Ambulatory Visit (HOSPITAL_BASED_OUTPATIENT_CLINIC_OR_DEPARTMENT_OTHER): Payer: Self-pay

## 2021-08-01 ENCOUNTER — Encounter (HOSPITAL_BASED_OUTPATIENT_CLINIC_OR_DEPARTMENT_OTHER): Payer: Self-pay | Admitting: Physical Therapy

## 2021-08-01 ENCOUNTER — Ambulatory Visit (HOSPITAL_BASED_OUTPATIENT_CLINIC_OR_DEPARTMENT_OTHER): Payer: Medicare Other | Admitting: Physical Therapy

## 2021-08-01 ENCOUNTER — Other Ambulatory Visit (HOSPITAL_BASED_OUTPATIENT_CLINIC_OR_DEPARTMENT_OTHER): Payer: Self-pay

## 2021-08-01 DIAGNOSIS — M6281 Muscle weakness (generalized): Secondary | ICD-10-CM | POA: Diagnosis not present

## 2021-08-01 DIAGNOSIS — M25611 Stiffness of right shoulder, not elsewhere classified: Secondary | ICD-10-CM | POA: Diagnosis not present

## 2021-08-01 DIAGNOSIS — G8929 Other chronic pain: Secondary | ICD-10-CM

## 2021-08-01 DIAGNOSIS — M25511 Pain in right shoulder: Secondary | ICD-10-CM | POA: Diagnosis not present

## 2021-08-01 NOTE — Therapy (Signed)
?OUTPATIENT PHYSICAL THERAPY SHOULDER Treatment  ? ? ?Patient Name: Bonnie Sanders ?MRN: [id number] ?DOB:1945/10/08, 76 y.o., female ?Today's Date: 08/01/2021 ? ? PT End of Session - 08/01/21 1013   ? ? Visit Number 6   ? Number of Visits 16   ? Date for PT Re-Evaluation 09/23/21   ? Authorization Type BCBS MCR   ? PT Start Time 1015   ? PT Stop Time 1055   ? PT Time Calculation (min) 40 min   ? Activity Tolerance Patient tolerated treatment well   ? Behavior During Therapy [facility] for tasks assessed/performed   ? ?  ?  ? ?  ? ? ? ? ? ? ?Past Medical History:  ?Diagnosis Date  ? Allergic rhinitis   ? Allergic rhinitis due to pollen 11/30/2010  ? Depression   ? Dyslipidemia   ? GERD (gastroesophageal reflux disease)   ? Heme positive stool   ? Hip pain, left 11/17/2013  ? Hyperlipidemia   ? Hypertension   ? Hypothyroid   ? Osteoporosis   ? Other screening mammogram   ? Primary osteoarthritis of left knee 07/25/2015  ? ?Past Surgical History:  ?Procedure Laterality Date  ? ABDOMINAL HYSTERECTOMY    ? COLONOSCOPY  2009  ? FOOT SURGERY    ? TOTAL KNEE ARTHROPLASTY    ? right foot  ? ?Patient Active Problem List  ? Diagnosis Date Noted  ? Sun-damaged skin 06/06/2021  ? Chronic right shoulder pain 06/04/2021  ? Fecal incontinence 01/28/2017  ? Grade I hemorrhoids 01/28/2017  ? Status post total right knee replacement using cement 07/25/2015  ? Midline low back pain with sciatica 12/07/2013  ? Colonic polyp 03/24/2012  ? Hypertension 11/30/2010  ? Osteoporosis 11/30/2010  ? Hypothyroid 10/25/2010  ? Hyperlipidemia with target low density lipoprotein (LDL) cholesterol less than 130 mg/dL 40/98/1191  ? Osteoarthritis, multiple sites 01/22/2010  ? Allergic rhinitis 05/25/2009  ? Vitamin B12 deficiency 05/25/2009  ? Atrophic vaginitis 04/10/2009  ? Gastro-esophageal reflux disease with esophagitis 10/20/2008  ? Tic disorder, unspecified 08/16/2008  ? ? ?PCP: [name], NP ? ?REFERRING PROVIDER: [name], NP ? ?REFERRING  DIAG: M25.511,G89.29 (ICD-10-CM) - Chronic right shoulder pain  ? ?THERAPY DIAG:  ?Decreased right shoulder range of motion ? ?Chronic right shoulder pain ? ?Muscle weakness (generalized) ? ? ?ONSET DATE: 2022 ? ?SUBJECTIVE:                                                                                                                                                                                     ? ?SUBJECTIVE STATEMENT: ? ?The patient reports she has improved with the shoulder signficantly.  She is doing the HEP and has bumped up to a green TB. She still complains of R sided neck pain on occasion.  ? ? ?Eval: ?Pt states she has recurrent R shoulder pain that happens every winter and goes away during the summer. She has had steroid injection before in the past but has not had one recently. Pt states she has difficulty and pain with lifting and carrying items. She found significant relief with Voltaren gel 3-4x a day. Does not recall any specific injury to the area. This has been an issue in the past and she has had a cortisone injection into the joint previously that made a big difference. She states that it feels like a back ache but in the shoulder. Pt states it  the pain wraps from the shoulder across the R pec. Pt states that reaching behind her back hurts and she has some pain into the neck. Pt states the pain will bother her at night after sitting. She states that moving her back to rest, it will hurt her neck. She is unsure if her neck pain is related. Pt states that she lays with her arm outstretched bc of carpal tunnel. Denies cancer red flags. Pt states she is R side sleeper and back sleeper.  ? ?Pt states she walks her dogs a lot and they pull pretty hard on her shoulders. She did have a fall from them that is over 5 years ago.  ? ?Pt has bilat hand NT.  ? ?PERTINENT HISTORY: ?Carpal tunnel ? ?PAIN:  ?Are you having pain? No ?FALLS:  ?Has patient fallen in last 6 months? No Number of falls:  0 ? ?LIVING ENVIRONMENT: ?Lives with: lives with their family ?Lives in: House/apartment ?Stairs: Yes; 5 steps to enter a two story home ?Has following equipment at home: None ? ?OCCUPATION: ?retired ? ?PLOF: Independent ? ?PATIENT GOALS : Pt reports she would like to get pain to go away/not get worse.  ? ?OBJECTIVE:  ? ?DIAGNOSTIC FINDINGS:  ?IMPRESSION: ?Mild-to-moderate acromioclavicular and mild glenohumeral ?osteoarthritis. ? ? ?IMPRESSION: ?Multilevel degenerative changes in the cervical spine most severe at ?C5-6. Severe left foraminal narrowing C5-6 due to spurring. ?  ? ?PATIENT SURVEYS:  ?FOTO 51 ?65 at D/C ?2 pts ? ? ?6th visit 4/26:  61pts ? ? ? ?TODAY'S TREATMENT:  ? ?4/26 ? ?C/S UPA on R grade III C3-6 ?LAD with subocc hold 5 min ?STM R C/S paraspinals ? ?Supine and seated chin tucks 1s 10x each ?Attempted median n. Glide but had too much neck pain ?Chin tuck with rotation caused too much R side neck pain ? ?Upper back stretch 30s 3x ?Cervical SNAG to L only 10x ? ?4/19 ?Manual: trigger point release to upper trap; sub-occipital release; Posterior capsule stretch of the shoulder.  ?Reviewed use of the thera-cane for home trigger point release. ? ?Supine ABC x1 2lbs  ? ?Side lying ER 2x10 1 lb ? ? ?Row green better technique today  ? 2x10 sec  ?Shoulder extension Green 2x10  ? ? ? ?4/13 ?Manual: trigger point release to upper trap; sub-occipital release; Posterior capsule stretch of the shoulder.  ?Reviewed use of the thera-cane for home trigger point release. ? ?Row green but hiked shoulder towards the end kept with red for HEP  ? 2x10 sec  ?Shoulder extension red 2x10  ? ?Supine wand flexion 2x10  ? ?Side lying ER 2x10  ? ?Supine ABC x1 ( added to HEP) ? ?Removed UT stretch. Stretch was causing  her pain and she wasn't doing it corectly depsite eduction and training  ? ?Reviewed pec stretching in the doorway high vs low. 2x20 sec hold.  ? ? ?4/5 ?Reviewed upper trap stretch for HEP. Sh required cuing  to do it properly. She required some cuing. She was advised if it hurts not to do it .  ? ?Manual: trigger point release to upper trap; sub-occipital release; Posterior capsule stretch of the shoulder.  ? ?Reviewed use of the thera-cane for home trigger point release. The patient has it at home but doesn't feel like she is using it correctly ? ?.  ?Row red 2x10 sec hold  ?Shoulder extension red 2x10  ? ?Supine wand flexion 2x10  ? ?Side lying ER  ? ?Updated and reviewed HEP ? ? ? ? ?07/06/2021 ?Removed posterior capsule strengthening because it caused pain  ? ?Manual: trigger point release to upper trap; sub-occipital release; Posterior capsule stretch of the shoulder.  ? ?Reviewed use of the thera-cane for home trigger point reelase. Reviewed benefits and risk of TPDN ? ?Upper trap stretch 3x20 sec hold for the right ?Doorway stretch 2x30 sec hold.  ? ?Row red 2x10 sec hold  ?Shoulder extension red 2x10  ?Bilateral er 2x10  ? ? ?Eval ? ?Exercises ?Standing Shoulder Posterior Capsule Stretch - 2 x daily - 7 x weekly - 1 sets - 3 reps ?Doorway Pec Stretch at 60 Degrees Abduction with Arm Straight - 2 x daily - 7 x weekly - 1 sets - 3 reps - 30 hold ?Standing Shoulder Row with Anchored Resistance - 1 x daily - 7 x weekly - 3 sets - 10 reps ? ? ? ?PATIENT EDUCATION: ?Education details: MOI, diagnosis, prognosis, anatomy, exercise progression, DOMS expectations, muscle firing,  envelope of function, HEP, POC ? ?Person educated: Patient ?Education method: Explanation, Demonstration, Tactile cues, Verbal cues, and Handouts ?Education comprehension: verbalized understanding, returned demonstration, verbal cues required, and tactile cues required ? ? ?HOME EXERCISE PROGRAM: ?Access Code: [email or web address] ?URL: [email or web address] ?Date: 06/25/2021 ?Prepared by: Zebedee IbaAlan Macai Sisneros ? ?ASSESSMENT: ? ?CLINICAL IMPRESSION: ?Pt demos C/S restriction at today's session that is potential cause for radicular type pain down the R UE  and into the shoulder. Pt's R shoulder strength and fucntion are much better but still has deep aching pain as well as mild NT into the fingers. Pt responded well with STM and joint mobs today- abolishment of tingling

## 2021-08-06 ENCOUNTER — Other Ambulatory Visit (HOSPITAL_BASED_OUTPATIENT_CLINIC_OR_DEPARTMENT_OTHER): Payer: Self-pay

## 2021-08-08 ENCOUNTER — Ambulatory Visit (HOSPITAL_BASED_OUTPATIENT_CLINIC_OR_DEPARTMENT_OTHER): Payer: Medicare Other | Admitting: Physical Therapy

## 2021-08-10 ENCOUNTER — Encounter (HOSPITAL_BASED_OUTPATIENT_CLINIC_OR_DEPARTMENT_OTHER): Payer: Self-pay | Admitting: Physical Therapy

## 2021-08-10 ENCOUNTER — Ambulatory Visit (HOSPITAL_BASED_OUTPATIENT_CLINIC_OR_DEPARTMENT_OTHER): Payer: Medicare Other | Attending: Family Medicine | Admitting: Physical Therapy

## 2021-08-10 DIAGNOSIS — M25511 Pain in right shoulder: Secondary | ICD-10-CM | POA: Diagnosis not present

## 2021-08-10 DIAGNOSIS — M6281 Muscle weakness (generalized): Secondary | ICD-10-CM | POA: Insufficient documentation

## 2021-08-10 DIAGNOSIS — G8929 Other chronic pain: Secondary | ICD-10-CM | POA: Diagnosis not present

## 2021-08-10 DIAGNOSIS — M25611 Stiffness of right shoulder, not elsewhere classified: Secondary | ICD-10-CM | POA: Diagnosis not present

## 2021-08-10 NOTE — Therapy (Signed)
?OUTPATIENT PHYSICAL THERAPY SHOULDER Treatment  ? ? ?Patient Name: Bonnie Sanders ?MRN: [id number] ?DOB:18-Jun-1945, 76 y.o., female ?Today's Date: 08/10/2021 ? ? PT End of Session - 08/10/21 [id number]   ? ? Visit Number 7   ? Number of Visits 16   ? Date for PT Re-Evaluation 09/23/21   ? Authorization Type BC[employer] MCR   ? PT Start Time 0802   ? PT Stop Time [phone]   ? PT Time Calculation (min) 41 min   ? Activity Tolerance Patient tolerated treatment well   ? Behavior During Therapy [facility] for tasks assessed/performed   ? ?  ?  ? ?  ? ? ? ? ? ? ? ?Past Medical History:  ?Diagnosis Date  ? Allergic rhinitis   ? Allergic rhinitis due to pollen 11/30/2010  ? Depression   ? Dyslipidemia   ? GERD (gastroesophageal reflux disease)   ? Heme positive stool   ? Hip pain, left 11/17/2013  ? Hyperlipidemia   ? Hypertension   ? Hypothyroid   ? Osteoporosis   ? Other screening mammogram   ? Primary osteoarthritis of left knee 07/25/2015  ? ?Past Surgical History:  ?Procedure Laterality Date  ? ABDOMINAL HYSTERECTOMY    ? COLONOSCOPY  2009  ? FOOT SURGERY    ? TOTAL KNEE ARTHROPLASTY    ? right foot  ? ?Patient Active Problem List  ? Diagnosis Date Noted  ? Sun-damaged skin 06/06/2021  ? Chronic right shoulder pain 06/04/2021  ? Fecal incontinence 01/28/2017  ? Grade I hemorrhoids 01/28/2017  ? Status post total right knee replacement using cement 07/25/2015  ? Midline low back pain with sciatica 12/07/2013  ? Colonic polyp 03/24/2012  ? Hypertension 11/30/2010  ? Osteoporosis 11/30/2010  ? Hypothyroid 10/25/2010  ? Hyperlipidemia with target low density lipoprotein (LDL) cholesterol less than 130 mg/dL 10/25/2010  ? Osteoarthritis, multiple sites 01/22/2010  ? Allergic rhinitis 05/25/2009  ? Vitamin B12 deficiency 05/25/2009  ? Atrophic vaginitis 04/10/2009  ? Gastro-esophageal reflux disease with esophagitis 10/20/2008  ? Tic disorder, unspecified 08/16/2008  ? ? ?PCP: Orma Render, NP ? ?REFERRING PROVIDER: Orma Render, NP ? ?REFERRING  DIAG: M25.511,G89.29 (ICD-10-CM) - Chronic right shoulder pain  ? ?THERAPY DIAG:  ?Decreased right shoulder range of motion ? ?Chronic right shoulder pain ? ?Muscle weakness (generalized) ? ? ?ONSET DATE: 2022 ? ?SUBJECTIVE:                                                                                                                                                                                     ? ?SUBJECTIVE STATEMENT: ? ?Pt states the pain is better. She was a  little sore after last session but it is much mbetter today.  ? ? ?Eval: ?Pt states she has recurrent R shoulder pain that happens every winter and goes away during the summer. She has had steroid injection before in the past but has not had one recently. Pt states she has difficulty and pain with lifting and carrying items. She found significant relief with Voltaren gel 3-4x a day. Does not recall any specific injury to the area. This has been an issue in the past and she has had a cortisone injection into the joint previously that made a big difference. She states that it feels like a back ache but in the shoulder. Pt states it  the pain wraps from the shoulder across the R pec. Pt states that reaching behind her back hurts and she has some pain into the neck. Pt states the pain will bother her at night after sitting. She states that moving her back to rest, it will hurt her neck. She is unsure if her neck pain is related. Pt states that she lays with her arm outstretched bc of carpal tunnel. Denies cancer red flags. Pt states she is R side sleeper and back sleeper.  ? ?Pt states she walks her dogs a lot and they pull pretty hard on her shoulders. She did have a fall from them that is over 5 years ago.  ? ?Pt has bilat hand NT.  ? ?PERTINENT HISTORY: ?Carpal tunnel ? ?PAIN:  ?Are you having pain? No ?FALLS:  ?Has patient fallen in last 6 months? No Number of falls: 0 ? ?LIVING ENVIRONMENT: ?Lives with: lives with their family ?Lives in:  House/apartment ?Stairs: Yes; 5 steps to enter a two story home ?Has following equipment at home: None ? ?OCCUPATION: ?retired ? ?PLOF: Independent ? ?PATIENT GOALS : Pt reports she would like to get pain to go away/not get worse.  ? ?OBJECTIVE:  ? ?DIAGNOSTIC FINDINGS:  ?IMPRESSION: ?Mild-to-moderate acromioclavicular and mild glenohumeral ?osteoarthritis. ? ? ?IMPRESSION: ?Multilevel degenerative changes in the cervical spine most severe at ?C5-6. Severe left foraminal narrowing C5-6 due to spurring. ?  ? ?PATIENT SURVEYS:  ?FOTO 24 ?65 at D/C ?2 pts ? ? ?6th visit 4/26:  61pts ? ? ? ?TODAY'S TREATMENT:  ? ?5/3 ? ?C/S UPA on R grade III C3-6 ?LAD with subocc hold 5 min ?STM R C/S paraspinals ? ?Supine and seated chin tucks 1s 10x each ?Levator stretch 30s 3x on R only ? ?Cervical SNAG to L only 10x ? ?4/26 ? ?C/S UPA on R grade III C3-6 ?LAD with subocc hold 5 min ?STM R C/S paraspinals ? ?Supine and seated chin tucks 1s 10x each ?Attempted median n. Glide but had too much neck pain ?Chin tuck with rotation caused too much R side neck pain ? ?Upper back stretch 30s 3x ?Cervical SNAG to L only 10x ? ?4/19 ?Manual: trigger point release to upper trap; sub-occipital release; Posterior capsule stretch of the shoulder.  ?Reviewed use of the thera-cane for home trigger point release. ? ?Supine ABC x1 2lbs  ? ?Side lying ER 2x10 1 lb ? ? ?Row green better technique today  ? 2x10 sec  ?Shoulder extension Green 2x10  ? ? ? ?4/13 ?Manual: trigger point release to upper trap; sub-occipital release; Posterior capsule stretch of the shoulder.  ?Reviewed use of the thera-cane for home trigger point release. ? ?Row green but hiked shoulder towards the end kept with red for HEP  ? 2x10 sec  ?Shoulder  extension red 2x10  ? ?Supine wand flexion 2x10  ? ?Side lying ER 2x10  ? ?Supine ABC x1 ( added to HEP) ? ?Removed UT stretch. Stretch was causing her pain and she wasn't doing it corectly depsite eduction and training  ? ?Reviewed  pec stretching in the doorway high vs low. 2x20 sec hold.  ? ? ?4/5 ?Reviewed upper trap stretch for HEP. Sh required cuing to do it properly. She required some cuing. She was advised if it hurts not to do it .  ? ?Manual: trigger point release to upper trap; sub-occipital release; Posterior capsule stretch of the shoulder.  ? ?Reviewed use of the thera-cane for home trigger point release. The patient has it at home but doesn't feel like she is using it correctly ? ?.  ?Row red 2x10 sec hold  ?Shoulder extension red 2x10  ? ?Supine wand flexion 2x10  ? ?Side lying ER  ? ?Updated and reviewed HEP ? ? ? ? ?07/06/2021 ?Removed posterior capsule strengthening because it caused pain  ? ?Manual: trigger point release to upper trap; sub-occipital release; Posterior capsule stretch of the shoulder.  ? ?Reviewed use of the thera-cane for home trigger point reelase. Reviewed benefits and risk of TPDN ? ?Upper trap stretch 3x20 sec hold for the right ?Doorway stretch 2x30 sec hold.  ? ?Row red 2x10 sec hold  ?Shoulder extension red 2x10  ?Bilateral er 2x10  ? ? ?Eval ? ?Exercises ?Standing Shoulder Posterior Capsule Stretch - 2 x daily - 7 x weekly - 1 sets - 3 reps ?Doorway Pec Stretch at 60 Degrees Abduction with Arm Straight - 2 x daily - 7 x weekly - 1 sets - 3 reps - 30 hold ?Standing Shoulder Row with Anchored Resistance - 1 x daily - 7 x weekly - 3 sets - 10 reps ? ? ? ?PATIENT EDUCATION: ?Education details: MOI, diagnosis, prognosis, anatomy, exercise progression, DOMS expectations, muscle firing,  envelope of function, HEP, POC ? ?Person educated: Patient ?Education method: Explanation, Demonstration, Tactile cues, Verbal cues, and Handouts ?Education comprehension: verbalized understanding, returned demonstration, verbal cues required, and tactile cues required ? ? ?HOME EXERCISE PROGRAM: ?Access Code: [email or web address] ?URL: [email or web address] ?Date: 06/25/2021 ?Prepared by: Daleen Bo ? ?ASSESSMENT: ? ?CLINICAL IMPRESSION: ?Pt able to introduce gentle R sided neck stretching today without pain following manual therapy. With chin tuck activity pt had reduction of tingling with UE. Pt HEP trimmed due to report of mixed com

## 2021-08-16 ENCOUNTER — Encounter (HOSPITAL_BASED_OUTPATIENT_CLINIC_OR_DEPARTMENT_OTHER): Payer: Self-pay | Admitting: Physical Therapy

## 2021-08-16 ENCOUNTER — Encounter (HOSPITAL_BASED_OUTPATIENT_CLINIC_OR_DEPARTMENT_OTHER): Payer: Self-pay | Admitting: Nurse Practitioner

## 2021-08-16 ENCOUNTER — Ambulatory Visit (INDEPENDENT_AMBULATORY_CARE_PROVIDER_SITE_OTHER): Payer: Medicare Other | Admitting: Nurse Practitioner

## 2021-08-16 ENCOUNTER — Ambulatory Visit (HOSPITAL_BASED_OUTPATIENT_CLINIC_OR_DEPARTMENT_OTHER): Payer: Medicare Other | Admitting: Physical Therapy

## 2021-08-16 VITALS — BP 112/72 | HR 77 | Ht 61.0 in | Wt 108.5 lb

## 2021-08-16 DIAGNOSIS — M6281 Muscle weakness (generalized): Secondary | ICD-10-CM

## 2021-08-16 DIAGNOSIS — Z0000 Encounter for general adult medical examination without abnormal findings: Secondary | ICD-10-CM

## 2021-08-16 DIAGNOSIS — G8929 Other chronic pain: Secondary | ICD-10-CM | POA: Diagnosis not present

## 2021-08-16 DIAGNOSIS — M25511 Pain in right shoulder: Secondary | ICD-10-CM | POA: Diagnosis not present

## 2021-08-16 DIAGNOSIS — M25611 Stiffness of right shoulder, not elsewhere classified: Secondary | ICD-10-CM

## 2021-08-16 NOTE — Patient Instructions (Signed)
?  Bonnie Sanders , ?Thank you for taking time to come for your Medicare Wellness Visit. I appreciate your ongoing commitment to your health goals. Please review the following plan we discussed and let me know if I can assist you in the future.  ? ?These are the goals we discussed: ? Goals   ? ?  Patient Stated   ?  Go out to the movies more this year. I have only been twice since the pandemic.  ?  ? ?  ?  ?This is a list of the screening recommended for you and due dates:  ?Health Maintenance  ?Topic Date Due  ? Zoster (Shingles) Vaccine (1 of 2) Never done  ? Pneumonia Vaccine (2 - PCV) 06/02/2013  ? Colon Cancer Screening  03/06/2015  ? COVID-19 Vaccine (3 - Booster for Pfizer series) 09/01/2019  ? Tetanus Vaccine  04/08/2021  ? Flu Shot  11/06/2021  ? DEXA scan (bone density measurement)  Completed  ? HPV Vaccine  Aged Out  ? Hepatitis C Screening: USPSTF Recommendation to screen - Ages 84-79 yo.  Discontinued  ? ? ?

## 2021-08-16 NOTE — Progress Notes (Signed)
? ?Subjective:  ? Bonnie DemarkShelley N Sanders is a 76 y.o. female who presents for Medicare Annual (Subsequent) preventive examination. ? ?Review of Systems    ?Negative ?  ? ?   ?Objective:  ?  ?Today's Vitals  ? 08/16/21 1411  ?BP: 112/72  ?Pulse: 77  ?SpO2: 92%  ?Weight: 108 lb 8 oz (49.2 kg)  ?Height: 5\' 1"  (1.549 m)  ? ?Body mass index is 20.5 kg/m?. ? ? ?  08/22/2021  ?  8:27 PM 06/25/2021  ?  1:50 PM  ?Advanced Directives  ?Does Patient Have a Medical Advance Directive? No No  ?Does patient want to make changes to medical advance directive? Yes (MAU/Ambulatory/Procedural Areas - Information given)   ?Would patient like information on creating a medical advance directive? Yes (MAU/Ambulatory/Procedural Areas - Information given) No - Patient declined  ? ? ?Current Medications (verified) ?Outpatient Encounter Medications as of 08/16/2021  ?Medication Sig  ? amitriptyline (ELAVIL) 150 MG tablet Take 1 tablet (150 mg total) by mouth at bedtime.  ? fluticasone (FLONASE) 50 MCG/ACT nasal spray Place 2 sprays into the nose daily.  ? levothyroxine (SYNTHROID) 75 MCG tablet Take 1 tablet (75 mcg total) by mouth every morning.  ? lisinopril (ZESTRIL) 10 MG tablet Take 1 tablet (10 mg total) by mouth daily.  ? montelukast (SINGULAIR) 10 MG tablet Take 1 tablet (10 mg total) by mouth at bedtime.  ? Multiple Vitamin (MULTIVITAMIN) tablet Take 1 tablet by mouth daily.    ? omeprazole (PRILOSEC) 20 MG capsule Take 1 capsule (20 mg total) by mouth daily.  ? rosuvastatin (CRESTOR) 5 MG tablet Take 1 tablet (5 mg total) by mouth at bedtime.  ? tretinoin (RETIN-A) 0.05 % cream Apply a pea sized amount spreading evenly in a thin layer to face at bedtime. Avoid use close to the eyes and mouth. Wash hands thoroughly after applying. Avoid sunlight exposure.  ? fenofibrate (TRICOR) 145 MG tablet Take 1 tablet (145 mg total) by mouth daily.  ? ?No facility-administered encounter medications on file as of 08/16/2021.  ? ? ?Allergies  (verified) ?Codeine and Statins  ? ?History: ?Past Medical History:  ?Diagnosis Date  ? Allergic rhinitis   ? Allergic rhinitis due to pollen 11/30/2010  ? Depression   ? Dyslipidemia   ? GERD (gastroesophageal reflux disease)   ? Heme positive stool   ? Hip pain, left 11/17/2013  ? Hyperlipidemia   ? Hypertension   ? Hypothyroid   ? Osteoporosis   ? Other screening mammogram   ? Primary osteoarthritis of left knee 07/25/2015  ? ?Past Surgical History:  ?Procedure Laterality Date  ? ABDOMINAL HYSTERECTOMY    ? COLONOSCOPY  2009  ? FOOT SURGERY    ? TOTAL KNEE ARTHROPLASTY    ? right foot  ? ?Family History  ?Problem Relation Age of Onset  ? Hypertension Mother   ? Arthritis Mother   ? Hypertension Father   ? Heart disease Father   ? Hyperlipidemia Father   ? Arthritis Father   ? Hypertension Maternal Grandmother   ? Stroke Maternal Grandmother   ? Heart disease Maternal Grandmother   ? Arthritis Maternal Grandmother   ? Hypertension Maternal Grandfather   ? Arthritis Maternal Grandfather   ? Hypertension Paternal Grandmother   ? Arthritis Paternal Grandmother   ? Hypertension Paternal Grandfather   ? Arthritis Paternal Grandfather   ? ?Social History  ? ?Socioeconomic History  ? Marital status: Married  ?  Spouse name: Not on file  ?  Number of children: Not on file  ? Years of education: Not on file  ? Highest education level: Not on file  ?Occupational History  ? Not on file  ?Tobacco Use  ? Smoking status: Never  ? Smokeless tobacco: Never  ?Substance and Sexual Activity  ? Alcohol use: No  ? Drug use: No  ? Sexual activity: Not Currently  ?Other Topics Concern  ? Not on file  ?Social History Narrative  ? Not on file  ? ?Social Determinants of Health  ? ?Financial Resource Strain: Low Risk   ? Difficulty of Paying Living Expenses: Not hard at all  ?Food Insecurity: No Food Insecurity  ? Worried About Programme researcher, broadcasting/film/video in the Last Year: Never true  ? Ran Out of Food in the Last Year: Never true  ?Transportation  Needs: No Transportation Needs  ? Lack of Transportation (Medical): No  ? Lack of Transportation (Non-Medical): No  ?Physical Activity: Insufficiently Active  ? Days of Exercise per Week: 7 days  ? Minutes of Exercise per Session: 20 min  ?Stress: No Stress Concern Present  ? Feeling of Stress : Not at all  ?Social Connections: Socially Integrated  ? Frequency of Communication with Friends and Family: More than three times a week  ? Frequency of Social Gatherings with Friends and Family: More than three times a week  ? Attends Religious Services: More than 4 times per year  ? Active Member of Clubs or Organizations: Yes  ? Attends Banker Meetings: More than 4 times per year  ? Marital Status: Married  ? ? ?Tobacco Counseling ?Counseling given: Not Answered ? ? ?Clinical Intake: ? ?Pre-visit preparation completed: No ? ?Pain : No/denies pain ? ?  ? ?BMI - recorded: 20.5 ?Nutritional Status: BMI of 19-24  Normal ?Nutritional Risks: None ?Diabetes: No ? ?How often do you need to have someone help you when you read instructions, pamphlets, or other written materials from your doctor or pharmacy?: 1 - Never ? ?Diabetic?no ? ?Interpreter Needed?: No ? ?  ? ? ?Activities of Daily Living ? ?  08/16/2021  ?  2:10 PM 08/16/2021  ?  2:08 PM  ?In your present state of health, do you have any difficulty performing the following activities:  ?Hearing? 0 0  ?Vision? 0 0  ?Difficulty concentrating or making decisions? 0 0  ?Walking or climbing stairs? 0 0  ?Dressing or bathing? 0 0  ?Doing errands, shopping? 0 0  ? ? ?Patient Care Team: ?Damiel Barthold, Sung Amabile, NP as PCP - General (Nurse Practitioner) ? ?Indicate any recent Medical Services you may have received from other than [facility] providers in the past year (date may be approximate). ? ?   ?Assessment:  ? This is a routine wellness examination for Bonnie Sanders. ? ?Hearing/Vision screen ?No results found. ? ?Dietary issues and exercise activities discussed: ?  ? ? Goals Addressed    ? ?  ?  ?  ?  ? This Visit's Progress  ?  Patient Stated     ?  Go out to the movies more this year. I have only been twice since the pandemic.  ?  ? ?  ? ?Depression Screen ? ?  07/17/2021  ?  2:45 PM 06/06/2021  ?  8:06 AM  ?PHQ 2/9 Scores  ?PHQ - 2 Score 0 0  ?  ?Fall Risk ? ?  08/16/2021  ?  2:08 PM 07/17/2021  ?  2:43 PM 06/06/2021  ?  8:06  AM 11/06/2017  ?  3:45 PM  ?Fall Risk   ?Falls in the past year? 0 0 0 Yes  ?Comment    Emmi Telephone Survey: data to providers prior to load  ?Number falls in past yr: 0 0 0 2 or more  ?Comment    Emmi Telephone Survey Actual Response = 2  ?Injury with Fall? 0 0 0 No  ?Risk for fall due to : No Fall Risks No Fall Risks No Fall Risks   ?Follow up Education provided;Falls evaluation completed Falls evaluation completed Falls evaluation completed   ? ? ?FALL RISK PREVENTION PERTAINING TO THE HOME: ? ?Any stairs in or around the home? No  ?If so, are there any without handrails? No  ?Home free of loose throw rugs in walkways, pet beds, electrical cords, etc? Yes  ?Adequate lighting in your home to reduce risk of falls? Yes  ? ?ASSISTIVE DEVICES UTILIZED TO PREVENT FALLS: ? ?Life alert? No  ?Use of a cane, walker or w/c? No  ?Grab bars in the bathroom? No  ?Shower chair or bench in shower? No  ?Elevated toilet seat or a handicapped toilet? No  ? ?TIMED UP AND GO: ? ?Was the test performed? Yes .  ?Length of time to ambulate 10 feet: 5 sec.  ? ?Gait slow and steady without use of assistive device ? ?Cognitive Function: ?  ?  ? ?  08/22/2021  ?  8:28 PM  ?6CIT Screen  ?What Year? 0 points  ?What month? 0 points  ?What time? 0 points  ?Count back from 20 0 points  ?Months in reverse 0 points  ?Repeat phrase 0 points  ?Total Score 0 points  ? ? ?Immunizations ?Immunization History  ?Administered Date(s) Administered  ? Influenza Whole 12/03/2010  ? Influenza, High Dose Seasonal PF 12/26/2012, 12/12/2016  ? Influenza-Unspecified 12/07/2012, 12/07/2017, 12/07/2020  ? PFIZER(Purple  Top)SARS-COV-2 Vaccination 06/10/2019, 07/07/2019  ? Pneumococcal Polysaccharide-23 06/02/2012  ? Tdap 12/03/2010, 04/09/2011  ? Zoster, Live 04/08/2008, 03/06/2010  ? ? ?TDAP status: Due, Education has been provided regarding the i

## 2021-08-16 NOTE — Therapy (Signed)
?OUTPATIENT PHYSICAL THERAPY SHOULDER Treatment  ? ? ?Patient Name: Bonnie Sanders ?MRN: [id number] ?DOB:1945-12-11, 76 y.o., female ?Today's Date: 08/16/2021 ? ? PT End of Session - 08/16/21 1305   ? ? Visit Number 8   ? Number of Visits 16   ? Date for PT Re-Evaluation 09/23/21   ? Authorization Type BCBS MCR   ? PT Start Time 1304   ? PT Stop Time [id number]   ? PT Time Calculation (min) 41 min   ? Activity Tolerance Patient tolerated treatment well   ? Behavior During Therapy [facility] for tasks assessed/performed   ? ?  ?  ? ?  ? ? ? ? ? ? ? ?Past Medical History:  ?Diagnosis Date  ? Allergic rhinitis   ? Allergic rhinitis due to pollen 11/30/2010  ? Depression   ? Dyslipidemia   ? GERD (gastroesophageal reflux disease)   ? Heme positive stool   ? Hip pain, left 11/17/2013  ? Hyperlipidemia   ? Hypertension   ? Hypothyroid   ? Osteoporosis   ? Other screening mammogram   ? Primary osteoarthritis of left knee 07/25/2015  ? ?Past Surgical History:  ?Procedure Laterality Date  ? ABDOMINAL HYSTERECTOMY    ? COLONOSCOPY  2009  ? FOOT SURGERY    ? TOTAL KNEE ARTHROPLASTY    ? right foot  ? ?Patient Active Problem List  ? Diagnosis Date Noted  ? Sun-damaged skin 06/06/2021  ? Chronic right shoulder pain 06/04/2021  ? Fecal incontinence 01/28/2017  ? Grade I hemorrhoids 01/28/2017  ? Status post total right knee replacement using cement 07/25/2015  ? Midline low back pain with sciatica 12/07/2013  ? Colonic polyp 03/24/2012  ? Hypertension 11/30/2010  ? Osteoporosis 11/30/2010  ? Hypothyroid 10/25/2010  ? Hyperlipidemia with target low density lipoprotein (LDL) cholesterol less than 130 mg/dL 10/25/2010  ? Osteoarthritis, multiple sites 01/22/2010  ? Allergic rhinitis 05/25/2009  ? Vitamin B12 deficiency 05/25/2009  ? Atrophic vaginitis 04/10/2009  ? Gastro-esophageal reflux disease with esophagitis 10/20/2008  ? Tic disorder, unspecified 08/16/2008  ? ? ?PCP: Orma Render, NP ? ?REFERRING PROVIDER: Orma Render, NP ? ?REFERRING  DIAG: M25.511,G89.29 (ICD-10-CM) - Chronic right shoulder pain  ? ?THERAPY DIAG:  ?Decreased right shoulder range of motion ? ?Chronic right shoulder pain ? ?Muscle weakness (generalized) ? ? ?ONSET DATE: 2022 ? ?SUBJECTIVE:                                                                                                                                                                                     ? ?SUBJECTIVE STATEMENT: ? ?Pt states the pain is better.  She states  that she still has NT but with less frequency. She is unsure of the SNAG exercise.  ? ? ?Eval: ?Pt states she has recurrent R shoulder pain that happens every winter and goes away during the summer. She has had steroid injection before in the past but has not had one recently. Pt states she has difficulty and pain with lifting and carrying items. She found significant relief with Voltaren gel 3-4x a day. Does not recall any specific injury to the area. This has been an issue in the past and she has had a cortisone injection into the joint previously that made a big difference. She states that it feels like a back ache but in the shoulder. Pt states it  the pain wraps from the shoulder across the R pec. Pt states that reaching behind her back hurts and she has some pain into the neck. Pt states the pain will bother her at night after sitting. She states that moving her back to rest, it will hurt her neck. She is unsure if her neck pain is related. Pt states that she lays with her arm outstretched bc of carpal tunnel. Denies cancer red flags. Pt states she is R side sleeper and back sleeper.  ? ?Pt states she walks her dogs a lot and they pull pretty hard on her shoulders. She did have a fall from them that is over 5 years ago.  ? ?Pt has bilat hand NT.  ? ?PERTINENT HISTORY: ?Carpal tunnel ? ?PAIN:  ?Are you having pain? No ?FALLS:  ?Has patient fallen in last 6 months? No Number of falls: 0 ? ?LIVING ENVIRONMENT: ?Lives with: lives with their  family ?Lives in: House/apartment ?Stairs: Yes; 5 steps to enter a two story home ?Has following equipment at home: None ? ?OCCUPATION: ?retired ? ?PLOF: Independent ? ?PATIENT GOALS : Pt reports she would like to get pain to go away/not get worse.  ? ?OBJECTIVE:  ? ?DIAGNOSTIC FINDINGS:  ?IMPRESSION: ?Mild-to-moderate acromioclavicular and mild glenohumeral ?osteoarthritis. ? ? ?IMPRESSION: ?Multilevel degenerative changes in the cervical spine most severe at ?C5-6. Severe left foraminal narrowing C5-6 due to spurring. ?  ? ?PATIENT SURVEYS:  ?FOTO 55 ?65 at D/C ?2 pts ? ? ?6th visit 4/26:  61pts ? ? ? ?TODAY'S TREATMENT:  ? ?5/11 ? ?C/S UPA on R grade III C3-6 ?LAD with subocc hold 5 min ?STM L and R C/S paraspinals; L levator ? ?Bilat ER GTB 2x10 ?Cervical SNAG to L only 10x ?Doorway pec stretch 30s 3x ?Post shoulder rolls 20x ?GTB rowing and shoulder Ext 2x10 ?Seated chin tuck 10x ? ? ?5/3 ? ?C/S UPA on R grade III C3-6 ?LAD with subocc hold 5 min ?STM R C/S paraspinals ? ?Supine and seated chin tucks 1s 10x each ?Levator stretch 30s 3x on R only ? ?Cervical SNAG to L only 10x ? ?4/26 ? ?C/S UPA on R grade III C3-6 ?LAD with subocc hold 5 min ?STM R C/S paraspinals ? ?Supine and seated chin tucks 1s 10x each ?Attempted median n. Glide but had too much neck pain ?Chin tuck with rotation caused too much R side neck pain ? ?Upper back stretch 30s 3x ?Cervical SNAG to L only 10x ? ?4/19 ?Manual: trigger point release to upper trap; sub-occipital release; Posterior capsule stretch of the shoulder.  ?Reviewed use of the thera-cane for home trigger point release. ? ?Supine ABC x1 2lbs  ? ?Side lying ER 2x10 1 lb ? ? ?Row green better technique today  ?  2x10 sec  ?Shoulder extension Green 2x10  ? ? ? ?4/13 ?Manual: trigger point release to upper trap; sub-occipital release; Posterior capsule stretch of the shoulder.  ?Reviewed use of the thera-cane for home trigger point release. ? ?Row green but hiked shoulder towards  the end kept with red for HEP  ? 2x10 sec  ?Shoulder extension red 2x10  ? ?Supine wand flexion 2x10  ? ?Side lying ER 2x10  ? ?Supine ABC x1 ( added to HEP) ? ?Removed UT stretch. Stretch was causing her pain and she wasn't doing it corectly depsite eduction and training  ? ?Reviewed pec stretching in the doorway high vs low. 2x20 sec hold.  ? ? ?4/5 ?Reviewed upper trap stretch for HEP. Sh required cuing to do it properly. She required some cuing. She was advised if it hurts not to do it .  ? ?Manual: trigger point release to upper trap; sub-occipital release; Posterior capsule stretch of the shoulder.  ? ?Reviewed use of the thera-cane for home trigger point release. The patient has it at home but doesn't feel like she is using it correctly ? ?.  ?Row red 2x10 sec hold  ?Shoulder extension red 2x10  ? ?Supine wand flexion 2x10  ? ?Side lying ER  ? ?Updated and reviewed HEP ? ? ? ? ?07/06/2021 ?Removed posterior capsule strengthening because it caused pain  ? ?Manual: trigger point release to upper trap; sub-occipital release; Posterior capsule stretch of the shoulder.  ? ?Reviewed use of the thera-cane for home trigger point reelase. Reviewed benefits and risk of TPDN ? ?Upper trap stretch 3x20 sec hold for the right ?Doorway stretch 2x30 sec hold.  ? ?Row red 2x10 sec hold  ?Shoulder extension red 2x10  ?Bilateral er 2x10  ? ? ?Eval ? ?Exercises ?Standing Shoulder Posterior Capsule Stretch - 2 x daily - 7 x weekly - 1 sets - 3 reps ?Doorway Pec Stretch at 60 Degrees Abduction with Arm Straight - 2 x daily - 7 x weekly - 1 sets - 3 reps - 30 hold ?Standing Shoulder Row with Anchored Resistance - 1 x daily - 7 x weekly - 3 sets - 10 reps ? ? ? ?PATIENT EDUCATION: ?Education details: MOI, diagnosis, prognosis, anatomy, exercise progression, DOMS expectations, muscle firing,  envelope of function, HEP, POC ? ?Person educated: Patient ?Education method: Explanation, Demonstration, Tactile cues, Verbal cues, and  Handouts ?Education comprehension: verbalized understanding, returned demonstration, verbal cues required, and tactile cues required ? ? ?HOME EXERCISE PROGRAM: ?Access Code: [email or web address] ?URL: [email or web address]

## 2021-08-20 DIAGNOSIS — H524 Presbyopia: Secondary | ICD-10-CM | POA: Diagnosis not present

## 2021-08-21 ENCOUNTER — Other Ambulatory Visit (HOSPITAL_BASED_OUTPATIENT_CLINIC_OR_DEPARTMENT_OTHER): Payer: Self-pay

## 2021-08-21 DIAGNOSIS — H26491 Other secondary cataract, right eye: Secondary | ICD-10-CM | POA: Diagnosis not present

## 2021-08-22 ENCOUNTER — Ambulatory Visit (HOSPITAL_BASED_OUTPATIENT_CLINIC_OR_DEPARTMENT_OTHER): Payer: Medicare Other | Admitting: Physical Therapy

## 2021-08-29 ENCOUNTER — Encounter (HOSPITAL_BASED_OUTPATIENT_CLINIC_OR_DEPARTMENT_OTHER): Payer: Self-pay | Admitting: Physical Therapy

## 2021-08-29 ENCOUNTER — Ambulatory Visit (HOSPITAL_BASED_OUTPATIENT_CLINIC_OR_DEPARTMENT_OTHER): Payer: Medicare Other | Admitting: Physical Therapy

## 2021-08-29 DIAGNOSIS — M25511 Pain in right shoulder: Secondary | ICD-10-CM | POA: Diagnosis not present

## 2021-08-29 DIAGNOSIS — M6281 Muscle weakness (generalized): Secondary | ICD-10-CM | POA: Diagnosis not present

## 2021-08-29 DIAGNOSIS — G8929 Other chronic pain: Secondary | ICD-10-CM | POA: Diagnosis not present

## 2021-08-29 DIAGNOSIS — M25611 Stiffness of right shoulder, not elsewhere classified: Secondary | ICD-10-CM

## 2021-08-29 NOTE — Therapy (Addendum)
OUTPATIENT PHYSICAL THERAPY SHOULDER Treatment    Patient Name: Bonnie Sanders MRN: [id number] DOB:03-02-1946, 76 y.o., female Today's Date: 08/29/2021   PT End of Session - 08/29/21 1021     Visit Number 9    Number of Visits 16    Date for PT Re-Evaluation 09/23/21    Authorization Type BCBS MCR    PT Start Time 1017    PT Stop Time 1055    PT Time Calculation (min) 38 min    Activity Tolerance Patient tolerated treatment well    Behavior During Therapy WFL for tasks assessed/performed                   Past Medical History:  Diagnosis Date   Allergic rhinitis    Allergic rhinitis due to pollen 11/30/2010   Depression    Dyslipidemia    GERD (gastroesophageal reflux disease)    Heme positive stool    Hip pain, left 11/17/2013   Hyperlipidemia    Hypertension    Hypothyroid    Osteoporosis    Other screening mammogram    Primary osteoarthritis of left knee 07/25/2015   Past Surgical History:  Procedure Laterality Date   ABDOMINAL HYSTERECTOMY     COLONOSCOPY  2009   FOOT SURGERY     TOTAL KNEE ARTHROPLASTY     right foot   Patient Active Problem List   Diagnosis Date Noted   Sun-damaged skin 06/06/2021   Chronic right shoulder pain 06/04/2021   Fecal incontinence 01/28/2017   Grade I hemorrhoids 01/28/2017   Status post total right knee replacement using cement 07/25/2015   Midline low back pain with sciatica 12/07/2013   Colonic polyp 03/24/2012   Hypertension 11/30/2010   Osteoporosis 11/30/2010   Hypothyroid 10/25/2010   Hyperlipidemia with target low density lipoprotein (LDL) cholesterol less than 130 mg/dL 40/98/1191   Osteoarthritis, multiple sites 01/22/2010   Allergic rhinitis 05/25/2009   Vitamin B12 deficiency 05/25/2009   Atrophic vaginitis 04/10/2009   Gastro-esophageal reflux disease with esophagitis 10/20/2008   Tic disorder, unspecified 08/16/2008    PCP: [name], NP  REFERRING PROVIDER: [name], NP  REFERRING  DIAG: [phone] (ICD-10-CM) - Chronic right shoulder pain   THERAPY DIAG:  Decreased right shoulder range of motion  Chronic right shoulder pain  Muscle weakness (generalized)   ONSET DATE: 2022  SUBJECTIVE:                                                                                                                                                                                      SUBJECTIVE STATEMENT:  Pt states she had a day of extra  neck pain on Friday when she got out of the shower. She states HEP started hurting more in the back of the shoulder more after the pain started. Pt does state she has had more stress recently which could be contributing to the pain.   Unfortunately, did have a fall on Sunday. She denies any head injuries or other injuries other than skin abrasion on R arm.   Eval: Pt states she has recurrent R shoulder pain that happens every winter and goes away during the summer. She has had steroid injection before in the past but has not had one recently. Pt states she has difficulty and pain with lifting and carrying items. She found significant relief with Voltaren gel 3-4x a day. Does not recall any specific injury to the area. This has been an issue in the past and she has had a cortisone injection into the joint previously that made a big difference. She states that it feels like a back ache but in the shoulder. Pt states it  the pain wraps from the shoulder across the R pec. Pt states that reaching behind her back hurts and she has some pain into the neck. Pt states the pain will bother her at night after sitting. She states that moving her back to rest, it will hurt her neck. She is unsure if her neck pain is related. Pt states that she lays with her arm outstretched bc of carpal tunnel. Denies cancer red flags. Pt states she is R side sleeper and back sleeper.   Pt states she walks her dogs a lot and they pull pretty hard on her shoulders. She did have a  fall from them that is over 5 years ago.   Pt has bilat hand NT.   PERTINENT HISTORY: Carpal tunnel  PAIN:  Are you having pain? Yes 6/10 R side of neck with turning FALLS:  Has patient fallen in last 6 months? No Number of falls: 0  LIVING ENVIRONMENT: Lives with: lives with their family Lives in: House/apartment Stairs: Yes; 5 steps to enter a two story home Has following equipment at home: None  OCCUPATION: retired  PLOF: Independent  PATIENT GOALS : Pt reports she would like to get pain to go away/not get worse.   OBJECTIVE:   DIAGNOSTIC FINDINGS:  IMPRESSION: Mild-to-moderate acromioclavicular and mild glenohumeral osteoarthritis.   IMPRESSION: Multilevel degenerative changes in the cervical spine most severe at C5-6. Severe left foraminal narrowing C5-6 due to spurring.    PATIENT SURVEYS:  FOTO 61 65 at D/C 2 pts   6th visit 4/26:  61pts    TODAY'S TREATMENT:   5/18  C/S UPA on R grade III C3-6 LAD with subocc hold 5 min STM L and R C/S paraspinals; L levator  Shoulder rolls posterior 20x Levator stretch on R only 30s 3x  5/11  C/S UPA on R grade III C3-6 LAD with subocc hold 5 min STM L and R C/S paraspinals; L levator  Bilat ER GTB 2x10 Cervical SNAG to L only 10x Doorway pec stretch 30s 3x Post shoulder rolls 20x GTB rowing and shoulder Ext 2x10 Seated chin tuck 10x   5/3  C/S UPA on R grade III C3-6 LAD with subocc hold 5 min STM R C/S paraspinals  Supine and seated chin tucks 1s 10x each Levator stretch 30s 3x on R only  Cervical SNAG to L only 10x  4/26  C/S UPA on R grade III C3-6 LAD with subocc hold 5 min  STM R C/S paraspinals  Supine and seated chin tucks 1s 10x each Attempted median n. Glide but had too much neck pain Chin tuck with rotation caused too much R side neck pain  Upper back stretch 30s 3x Cervical SNAG to L only 10x  4/19 Manual: trigger point release to upper trap; sub-occipital release;  Posterior capsule stretch of the shoulder.  Reviewed use of the thera-cane for home trigger point release.  Supine ABC x1 2lbs   Side lying ER 2x10 1 lb   Row green better technique today   2x10 sec  Shoulder extension Green 2x10     4/13 Manual: trigger point release to upper trap; sub-occipital release; Posterior capsule stretch of the shoulder.  Reviewed use of the thera-cane for home trigger point release.  Row green but hiked shoulder towards the end kept with red for HEP   2x10 sec  Shoulder extension red 2x10   Supine wand flexion 2x10   Side lying ER 2x10   Supine ABC x1 ( added to HEP)  Removed UT stretch. Stretch was causing her pain and she wasn't doing it corectly depsite eduction and training   Reviewed pec stretching in the doorway high vs low. 2x20 sec hold.    4/5 Reviewed upper trap stretch for HEP. Sh required cuing to do it properly. She required some cuing. She was advised if it hurts not to do it .   Manual: trigger point release to upper trap; sub-occipital release; Posterior capsule stretch of the shoulder.   Reviewed use of the thera-cane for home trigger point release. The patient has it at home but doesn't feel like she is using it correctly  .  Row red 2x10 sec hold  Shoulder extension red 2x10   Supine wand flexion 2x10   Side lying ER   Updated and reviewed HEP     07/06/2021 Removed posterior capsule strengthening because it caused pain   Manual: trigger point release to upper trap; sub-occipital release; Posterior capsule stretch of the shoulder.   Reviewed use of the thera-cane for home trigger point reelase. Reviewed benefits and risk of TPDN  Upper trap stretch 3x20 sec hold for the right Doorway stretch 2x30 sec hold.   Row red 2x10 sec hold  Shoulder extension red 2x10  Bilateral er 2x10    Eval  Exercises Standing Shoulder Posterior Capsule Stretch - 2 x daily - 7 x weekly - 1 sets - 3 reps Doorway Pec Stretch  at 60 Degrees Abduction with Arm Straight - 2 x daily - 7 x weekly - 1 sets - 3 reps - 30 hold Standing Shoulder Row with Anchored Resistance - 1 x daily - 7 x weekly - 3 sets - 10 reps    PATIENT EDUCATION: Education details: MOI, diagnosis, prognosis, anatomy, exercise progression, DOMS expectations, muscle firing,  envelope of function, HEP, POC  Person educated: Patient Education method: Explanation, Demonstration, Tactile cues, Verbal cues, and Handouts Education comprehension: verbalized understanding, returned demonstration, verbal cues required, and tactile cues required   HOME EXERCISE PROGRAM: Access Code: [email or web address] URL: [email or web address] Date: 06/25/2021 Prepared by: Zebedee IbaAlan Onesti Bonfiglio  ASSESSMENT:  CLINICAL IMPRESSION: Pt presents to session today with elevated R sided neck pain that appears consistent with muscle spasm and stiffness. Pt session focused on pain reduction order to be able to return to normal ADL. Pt had reduction of pain from 6/10 to 3/10 by end of session with manual therapy and gentle stretching. Pt advised to continue with gentle  stretching and heat for the next day until pain resolves before returning to resisted psotural strengthening exercise. Plan to revisit ant shoulder stretching as well as promoting T/S extension when able. Pt would benefit from continued skilled therapy in order to reach goals and maximize functional postural strength and ROM for prevention of further functional decline..    OBJECTIVE IMPAIRMENTS decreased ROM, decreased strength, hypomobility, increased muscle spasms, impaired flexibility, impaired sensation, impaired UE functional use, postural dysfunction, and pain.   ACTIVITY LIMITATIONS cleaning, community activity, laundry, yard work, and exercise .   PERSONAL FACTORS Age, Fitness, Past/current experiences, Time since onset of injury/illness/exacerbation, and 1-2 comorbidities:    are also affecting patient's  functional outcome.    REHAB POTENTIAL: Good  CLINICAL DECISION MAKING: Stable/uncomplicated  EVALUATION COMPLEXITY: Low   GOALS:  SHORT TERM GOALS: Target date: 10/10/2021  Pt will become independent with HEP in order to demonstrate synthesis of PT education.   Goal status: INITIAL  2.  Pt will score at least 2 pt increase on FOTO to demonstrate functional improvement in MCII and pt perceived function.    Goal status: INITIAL  3.  Pt will be able to demonstrate BHB reach with R arm without pain in order to demonstrate functional improvement in UE/ function for self-care and house hold duties.   Goal status: INITIAL   LONG TERM GOALS: Target date: 11/21/2021  Pt  will become independent with final HEP in order to demonstrate synthesis of PT education.   Goal status: INITIAL  2.  Pt will score >/= 65 on FOTO to demonstrate improvement in perceived R UE function.   Goal status: INITIAL  3.  Pt will be able to reach [facility] and carry/hold >3 lbs in order to demonstrate functional improvement in R UE strength for return to PLOF and exercise.  Goal status: INITIAL  4.  Pt will be able to report ability sleep for extended periods of time without pain in order to demonstrate functional improvement and tolerance to static positioning.   Goal status: INITIAL     PLAN: PT FREQUENCY: 1-2x/week  PT DURATION: 12 weeks (likely D/C by 6)  PLANNED INTERVENTIONS: Therapeutic exercises, Therapeutic activity, Neuromuscular re-education, Balance training, Gait training, Patient/Family education, Joint manipulation, Joint mobilization, Dry Needling, Electrical stimulation, Spinal manipulation, Spinal mobilization, Cryotherapy, Moist heat, scar mobilization, Taping, Vasopneumatic device, Traction, Ultrasound, Ionotophoresis 4mg /ml Dexamethasone, and Manual therapy  PLAN FOR NEXT SESSION: review HEP, STM, C/S ROM and joint mobility   , PT DPT  08/29/2021, 10:58 AM

## 2021-08-30 ENCOUNTER — Other Ambulatory Visit (HOSPITAL_BASED_OUTPATIENT_CLINIC_OR_DEPARTMENT_OTHER): Payer: Self-pay | Admitting: Nurse Practitioner

## 2021-08-30 DIAGNOSIS — I10 Essential (primary) hypertension: Secondary | ICD-10-CM

## 2021-08-31 ENCOUNTER — Other Ambulatory Visit (HOSPITAL_BASED_OUTPATIENT_CLINIC_OR_DEPARTMENT_OTHER): Payer: Self-pay

## 2021-09-05 ENCOUNTER — Other Ambulatory Visit (HOSPITAL_BASED_OUTPATIENT_CLINIC_OR_DEPARTMENT_OTHER): Payer: Self-pay

## 2021-09-05 ENCOUNTER — Encounter (HOSPITAL_BASED_OUTPATIENT_CLINIC_OR_DEPARTMENT_OTHER): Payer: Self-pay | Admitting: Physical Therapy

## 2021-09-05 ENCOUNTER — Ambulatory Visit: Payer: Medicare Other | Attending: Internal Medicine

## 2021-09-05 ENCOUNTER — Ambulatory Visit (HOSPITAL_BASED_OUTPATIENT_CLINIC_OR_DEPARTMENT_OTHER): Payer: Medicare Other | Admitting: Physical Therapy

## 2021-09-05 DIAGNOSIS — G8929 Other chronic pain: Secondary | ICD-10-CM

## 2021-09-05 DIAGNOSIS — M6281 Muscle weakness (generalized): Secondary | ICD-10-CM | POA: Diagnosis not present

## 2021-09-05 DIAGNOSIS — M25611 Stiffness of right shoulder, not elsewhere classified: Secondary | ICD-10-CM | POA: Diagnosis not present

## 2021-09-05 DIAGNOSIS — Z23 Encounter for immunization: Secondary | ICD-10-CM

## 2021-09-05 DIAGNOSIS — M25511 Pain in right shoulder: Secondary | ICD-10-CM | POA: Diagnosis not present

## 2021-09-05 MED ORDER — PFIZER COVID-19 VAC BIVALENT 30 MCG/0.3ML IM SUSP
INTRAMUSCULAR | 0 refills | Status: DC
  Filled 2021-09-05: qty 0.3, 1d supply, fill #0

## 2021-09-05 NOTE — Therapy (Addendum)
OUTPATIENT PHYSICAL THERAPY SHOULDER Treatment   PHYSICAL THERAPY DISCHARGE SUMMARY  Visits from Start of Care: 10  Plan: Patient agrees to discharge.  Patient goals were mostly met. Patient is being discharged due to not returning to therapy.      Progress Note Reporting Period 06/25/21 to 09/05/21   See note below for Objective Data and Assessment of Progress/Goals.       Patient Name: Bonnie Sanders MRN: [id number] DOB:[birth year], 76 y.o., female Today's Date: 09/05/2021   PT End of Session - 09/05/21 1018     Visit Number 10    Number of Visits 16    Date for PT Re-Evaluation 09/23/21    Authorization Type BCBS MCR    PT Start Time 1016    PT Stop Time 1100    PT Time Calculation (min) 44 min    Activity Tolerance Patient tolerated treatment well    Behavior During Therapy [facility] for tasks assessed/performed                   Past Medical History:  Diagnosis Date   Allergic rhinitis    Allergic rhinitis due to pollen 11/30/2010   Depression    Dyslipidemia    GERD (gastroesophageal reflux disease)    Heme positive stool    Hip pain, left 11/17/2013   Hyperlipidemia    Hypertension    Hypothyroid    Osteoporosis    Other screening mammogram    Primary osteoarthritis of left knee 07/25/2015   Past Surgical History:  Procedure Laterality Date   ABDOMINAL HYSTERECTOMY     COLONOSCOPY  2009   FOOT SURGERY     TOTAL KNEE ARTHROPLASTY     right foot   Patient Active Problem List   Diagnosis Date Noted   Sun-damaged skin 06/06/2021   Chronic right shoulder pain 06/04/2021   Fecal incontinence 01/28/2017   Grade I hemorrhoids 01/28/2017   Status post total right knee replacement using cement 07/25/2015   Midline low back pain with sciatica 12/07/2013   Colonic polyp 03/24/2012   Hypertension 11/30/2010   Osteoporosis 11/30/2010   Hypothyroid 10/25/2010   Hyperlipidemia with target low density lipoprotein (LDL) cholesterol less than 130 mg/dL  10/25/2010   Osteoarthritis, multiple sites 01/22/2010   Allergic rhinitis 05/25/2009   Vitamin B12 deficiency 05/25/2009   Atrophic vaginitis 04/10/2009   Gastro-esophageal reflux disease with esophagitis 10/20/2008   Tic disorder, unspecified 08/16/2008    PCP: Orma Render, NP  REFERRING PROVIDER: Orma Render, NP  REFERRING DIAG: [phone] (ICD-10-CM) - Chronic right shoulder pain   THERAPY DIAG:  Decreased right shoulder range of motion  Chronic right shoulder pain  Muscle weakness (generalized)   ONSET DATE: 2022  SUBJECTIVE:  SUBJECTIVE STATEMENT:  Pt states her neck pain and shoulder pain have been much better. She had some pain on Sunday but it goes away within 36 hours hours. Pt feels like she is at least 50% better than when she started. Pt states she feels the pain more with bad weathre.    Eval: Pt states she has recurrent R shoulder pain that happens every winter and goes away during the summer. She has had steroid injection before in the past but has not had one recently. Pt states she has difficulty and pain with lifting and carrying items. She found significant relief with Voltaren gel 3-4x a day. Does not recall any specific injury to the area. This has been an issue in the past and she has had a cortisone injection into the joint previously that made a big difference. She states that it feels like a back ache but in the shoulder. Pt states it  the pain wraps from the shoulder across the R pec. Pt states that reaching behind her back hurts and she has some pain into the neck. Pt states the pain will bother her at night after sitting. She states that moving her back to rest, it will hurt her neck. She is unsure if her neck pain is related. Pt states that she lays with her arm  outstretched bc of carpal tunnel. Denies cancer red flags. Pt states she is R side sleeper and back sleeper.   Pt states she walks her dogs a lot and they pull pretty hard on her shoulders. She did have a fall from them that is over 5 years ago.   Pt has bilat hand NT.   PERTINENT HISTORY: Carpal tunnel  PAIN:  Are you having pain? No 0/10 R side of neck with turning FALLS:  Has patient fallen in last 6 months? No Number of falls: 0  LIVING ENVIRONMENT: Lives with: lives with their family Lives in: House/apartment Stairs: Yes; 5 steps to enter a two story home Has following equipment at home: None  OCCUPATION: retired  PLOF: Independent  PATIENT GOALS : Pt reports she would like to get pain to go away/not get worse.   OBJECTIVE:   DIAGNOSTIC FINDINGS:  IMPRESSION: Mild-to-moderate acromioclavicular and mild glenohumeral osteoarthritis.   IMPRESSION: Multilevel degenerative changes in the cervical spine most severe at C5-6. Severe left foraminal narrowing C5-6 due to spurring.    PATIENT SURVEYS:  FOTO 61 65 at D/C 2 pts   6th visit 4/26:  61pts 10th visit: 61 pts  UPPER EXTREMITY ROM: WFL, symmetric bilaterally, pain with behind back reaching (BHB) on R   C/S ROM: flexion WNL, ext 75%, L rot 50%, R 50% p!,  L SB 30%, R SB 30%   UPPER EXTREMITY MMT:   MMT Right 06/25/2021 Left 06/25/2021 R 5/31 L  5/31  Shoulder flexion 4/5 4/5 4+/5 4+/5  Shoulder extension        Shoulder abduction 4/5 4/5 4+/5 4+/5  Shoulder adduction        Shoulder internal rotation 4/5 4/5 4+/5 4+/5  Shoulder external rotation 4/5 4/5 4/5 4+/5  (Blank rows = not tested)   TODAY'S TREATMENT:   5/31  Trigger Point Dry-Needling  Treatment instructions: Expect mild to moderate muscle soreness. S/S of pneumothorax if dry needled over a lung field, and to seek immediate medical attention should they occur. Patient verbalized understanding of these instructions and education.    Patient Consent Given: Yes Education (verbally)provided: Yes Muscles treated: L  and R UT Electrical stimulation performed: No Treatment response/outcome: LTR elicited     Levator stretch on R only 30s 3x Shrugging GTB 2x10 Rowing Blue TB for home Chin tucking Bilat shoulder ER GTB  5/18  C/S UPA on R grade III C3-6 LAD with subocc hold 5 min STM L and R C/S paraspinals; L levator  Shoulder rolls posterior 20x Levator stretch on R only 30s 3x  5/11  C/S UPA on R grade III C3-6 LAD with subocc hold 5 min STM L and R C/S paraspinals; L levator  Bilat ER GTB 2x10 Cervical SNAG to L only 10x Doorway pec stretch 30s 3x Post shoulder rolls 20x GTB rowing and shoulder Ext 2x10 Seated chin tuck 10x   5/3  C/S UPA on R grade III C3-6 LAD with subocc hold 5 min STM R C/S paraspinals  Supine and seated chin tucks 1s 10x each Levator stretch 30s 3x on R only  Cervical SNAG to L only 10x  4/26  C/S UPA on R grade III C3-6 LAD with subocc hold 5 min STM R C/S paraspinals  Supine and seated chin tucks 1s 10x each Attempted median n. Glide but had too much neck pain Chin tuck with rotation caused too much R side neck pain  Upper back stretch 30s 3x Cervical SNAG to L only 10x    PATIENT EDUCATION: Education details: MOI, diagnosis, prognosis, anatomy, exercise progression, DOMS expectations, muscle firing,  envelope of function, HEP, POC  Person educated: Patient Education method: Explanation, Demonstration, Tactile cues, Verbal cues, and Handouts Education comprehension: verbalized understanding, returned demonstration, verbal cues required, and tactile cues required   HOME EXERCISE PROGRAM: Access Code: [email or web address] URL: [email or web address] Date: 06/25/2021 Prepared by: Daleen Bo  ASSESSMENT:  CLINICAL IMPRESSION: Pt does demo improvement in C/S ROM and UE strength at reassessment. Pt also reports 50% improvement despite static FOTO  score. Pt states score of FOTO may be related to answering questions based on single UE. Pt with good response to TPDN today with improvement in soft tissue extensibility and improvement in rotation ROM without pain. Plan to continue with UE strength as able and consider repeat of TPDN as needed. Pt has progressed well with therapy but is still largely limited in strength. Plan to add in OH movements at next session as able. Pt would benefit from continued skilled therapy in order to reach goals and maximize functional postural strength and ROM for prevention of further functional decline..    OBJECTIVE IMPAIRMENTS decreased ROM, decreased strength, hypomobility, increased muscle spasms, impaired flexibility, impaired sensation, impaired UE functional use, postural dysfunction, and pain.   ACTIVITY LIMITATIONS cleaning, community activity, laundry, yard work, and exercise .   PERSONAL FACTORS Age, Fitness, Past/current experiences, Time since onset of injury/illness/exacerbation, and 1-2 comorbidities:    are also affecting patient's functional outcome.    REHAB POTENTIAL: Good  CLINICAL DECISION MAKING: Stable/uncomplicated  EVALUATION COMPLEXITY: Low   GOALS:  SHORT TERM GOALS: Target date: 10/17/2021  Pt will become independent with HEP in order to demonstrate synthesis of PT education.   Goal status: MET  2.  Pt will score at least 2 pt increase on FOTO to demonstrate functional improvement in MCII and pt perceived function.    Goal status: ongoing  3.  Pt will be able to demonstrate BHB reach with R arm without pain in order to demonstrate functional improvement in UE/ function for self-care and house hold duties.   Goal status: MET   LONG  TERM GOALS: Target date: 11/28/2021  Pt  will become independent with final HEP in order to demonstrate synthesis of PT education.   Goal status: ongoing  2.  Pt will score >/= 65 on FOTO to demonstrate improvement in perceived R UE  function.   Goal status: INITIAL  3.  Pt will be able to reach [facility] and carry/hold >3 lbs in order to demonstrate functional improvement in R UE strength for return to PLOF and exercise.  Goal status: ongoing  4.  Pt will be able to report ability sleep for extended periods of time without pain in order to demonstrate functional improvement and tolerance to static positioning.   Goal status: MET     PLAN: PT FREQUENCY: 1-2x/week  PT DURATION: 12 weeks (likely D/C by 6)  PLANNED INTERVENTIONS: Therapeutic exercises, Therapeutic activity, Neuromuscular re-education, Balance training, Gait training, Patient/Family education, Joint manipulation, Joint mobilization, Dry Needling, Electrical stimulation, Spinal manipulation, Spinal mobilization, Cryotherapy, Moist heat, scar mobilization, Taping, Vasopneumatic device, Traction, Ultrasound, Ionotophoresis 53m/ml Dexamethasone, and Manual therapy  PLAN FOR NEXT SESSION: review HEP, STM, C/S ROM and joint mobility   ADaleen Bo PT DPT  09/05/2021, 11:03 AM

## 2021-09-07 NOTE — Progress Notes (Signed)
   Covid-19 Vaccination Clinic  Name:  LEKESHA EMLEY    MRN: [id number] DOB: [birth year]  09/07/2021  Ms. Reddin was observed post Covid-19 immunization for 15 minutes without incident. She was provided with Vaccine Information Sheet and instruction to access the V-Safe system.   Ms. Nickleson was instructed to call 911 with any severe reactions post vaccine: Difficulty breathing  Swelling of face and throat  A fast heartbeat  A bad rash all over body  Dizziness and weakness   Immunizations Administered     Name Date Dose VIS Date Route   Pfizer Covid-19 Vaccine Bivalent Booster 09/05/2021 12:14 PM 0.3 mL 12/06/2020 Intramuscular   Manufacturer: [place]   Lot: [id number]   [place]: [phone]

## 2021-09-13 ENCOUNTER — Telehealth (HOSPITAL_BASED_OUTPATIENT_CLINIC_OR_DEPARTMENT_OTHER): Payer: Self-pay | Admitting: Nurse Practitioner

## 2021-09-13 NOTE — Telephone Encounter (Signed)
Pt came by upfront to inform office that she received a letter form her ins regarding the AWV she had on 5/11. Code was used as Z00.  and not [id number]. Also when looking into the charge it looks like Dr. de [place] is the one that is on the charge. Not sure if this is the cause since he is leader provider. Needing code resubmitted. Please advise.

## 2021-09-14 NOTE — Telephone Encounter (Signed)
Called patient and notified her of update. Patient understood.

## 2021-09-14 NOTE — Telephone Encounter (Signed)
Patient does not have a balance with our office. Claim was properly submitted to reflect AWV and insurance company accepted the charges. Please call to notify patient of resolution.

## 2021-10-03 ENCOUNTER — Other Ambulatory Visit (HOSPITAL_BASED_OUTPATIENT_CLINIC_OR_DEPARTMENT_OTHER): Payer: Self-pay

## 2021-10-10 ENCOUNTER — Ambulatory Visit (HOSPITAL_BASED_OUTPATIENT_CLINIC_OR_DEPARTMENT_OTHER): Payer: Medicare Other | Admitting: Nurse Practitioner

## 2021-10-10 ENCOUNTER — Encounter (HOSPITAL_BASED_OUTPATIENT_CLINIC_OR_DEPARTMENT_OTHER): Payer: Self-pay | Admitting: Nurse Practitioner

## 2021-10-10 ENCOUNTER — Ambulatory Visit (INDEPENDENT_AMBULATORY_CARE_PROVIDER_SITE_OTHER): Payer: Medicare Other | Admitting: Nurse Practitioner

## 2021-10-10 VITALS — BP 117/78 | HR 71 | Ht 60.0 in | Wt 117.0 lb

## 2021-10-10 DIAGNOSIS — R202 Paresthesia of skin: Secondary | ICD-10-CM | POA: Diagnosis not present

## 2021-10-10 DIAGNOSIS — M79605 Pain in left leg: Secondary | ICD-10-CM

## 2021-10-10 DIAGNOSIS — R252 Cramp and spasm: Secondary | ICD-10-CM

## 2021-10-10 DIAGNOSIS — M79604 Pain in right leg: Secondary | ICD-10-CM | POA: Diagnosis not present

## 2021-10-10 DIAGNOSIS — M79671 Pain in right foot: Secondary | ICD-10-CM | POA: Insufficient documentation

## 2021-10-10 HISTORY — DX: Pain in right leg: M79.604

## 2021-10-10 HISTORY — DX: Cramp and spasm: R25.2

## 2021-10-10 NOTE — Assessment & Plan Note (Signed)
Bilateral paresthesias to the hands, most notably in the fingertips.  Examination does show decreased sensation in the distal tips of the fingers bilaterally specifically the thumb and first 3 fingers.  Review of prior imaging today shows that there have been cervical spine changes that could be contributing to the paresthesias the patient is experiencing.  Discussed with patient the changes noted in the cervical spine on last imaging and how these could be contributing.  Suspect this is a worsening side effect of the degenerative cervical spine.  Patient did have positive Tinel's sign on evaluation today which can indicate possible carpal tunnel syndrome.  It is possible that the patient has 2 etiologies presenting causing the symptoms. Discussed with patient the recommendation for stretching exercises of the cervical spine and monitoring which she is laying in bed to avoid having her head in an unusual or unnatural position.  Also recommend wrist braces to help keep wrist in a neutral position while sleeping.  She is undergoing physical therapy at this time therefore encouraged her to speak with her physical therapist about neck strengthening exercises that may be helpful.  If her symptoms fail to improve over the next few weeks with physical therapy we will plan to refer to orthopedics or hand specialist for further evaluation.

## 2021-10-10 NOTE — Assessment & Plan Note (Signed)
Bilateral lower extremity cramping predominantly in the calves, with recent discomfort in the front of her leg.  Increased frequency of symptoms now occurring every night.  Patient believes the symptoms are attributed to use of Crestor, which she has been taking for 2 years.  Physical exam of lower extremities benign.  No signs of inflammation, decreased strength, decreased muscle tone.  I we will plan to collect labs today for evaluation of electrolytes and thyroid. Recommend patient's stop rosuvastatin for at least the next 2 weeks and monitor if symptoms improve. Depending on lab findings and results of stopping the rosuvastatin, consider adjusting the dose or exploring alternative cholesterol-lowering medications. Patient advised to maintain adequate hydration, incorporate gentle stretching exercises, and monitor for new or worsening symptoms.  We will plan to follow-up in the next few weeks to determine if symptoms have improved.

## 2021-10-10 NOTE — Patient Instructions (Signed)
It was a pleasure seeing you today. I hope your time spent with [place] was pleasant and helpful. Please let us know if there is anything we can do to improve the service you receive.   Today we discussed concerns with:  Paresthesias I believe the numbness in your hands may be coming from the wrist and the neck.  Ask PT if they can help you work on these areas to see if this helps.  Muscle cramping I will check labs today and make sure there is not anything going on with your electrolytes that are causing this.  Stop the crestor for 2 weeks and lets see if the symptoms go away.     The following orders have been placed for you today:  Orders Placed This Encounter  Procedures   Comprehensive metabolic panel    Order Specific Question:   Has the patient fasted?    Answer:   Yes    Order Specific Question:   Release to patient    Answer:   Immediate   TSH    Order Specific Question:   Release to patient    Answer:   Immediate   VITAMIN D 25 Hydroxy (Vit-D Deficiency, Fractures)    Order Specific Question:   Release to patient    Answer:   Immediate     Important Office Information Lab Results If labs were ordered, please note that you will see results through MyChart as soon as they come available from [facility].  It takes up to 5 business days for the results to be routed to me and for me to review them once all of the lab results have come through from [facility]. I will make recommendations based on your results and send these through MyChart or someone from the office will call you to discuss. If your labs are abnormal, we may contact you to schedule a visit to discuss the results and make recommendations.  If you have not heard from [place] within 5 business days or you have waited longer than a week and your lab results have not come through on MyChart, please feel free to call the office or send a message through MyChart to follow-up on these labs.   Referrals If referrals were placed  today, the office where the referral was sent will contact you either by phone or through MyChart to set up scheduling. Please note that it can take up to a week for the referral office to contact you. If you do not hear from them in a week, please contact the referral office directly to inquire about scheduling.   Condition Treated If your condition worsens or you begin to have new symptoms, please schedule a follow-up appointment for further evaluation. If you are not sure if an appointment is needed, you may call the office to leave a message for the nurse and someone will contact you with recommendations.  If you have an urgent or life threatening emergency, please do not call the office, but seek emergency evaluation by calling 911 or going to the nearest emergency room for evaluation.   MyChart and Phone Calls Please do not use MyChart for urgent messages. It may take up to 3 business days for MyChart messages to be read by staff and if they are unable to handle the request, an additional 3 business days for them to be routed to me and for my response.  Messages sent to the provider through MyChart do not come directly to the provider,  please allow time for these messages to be routed and for me to respond.  We get a large volume of MyChart messages daily and these are responded to in the order received.   For urgent messages, please call the office at [phone] and speak with the front office staff or leave a message on the line of my assistant for guidance.  We are seeing patients from the hours of 8:00 am through 5:00 pm and calls directly to the nurse may not be answered immediately due to seeing patients, but your call will be returned as soon as possible.  Phone  messages received after 4:00 PM Monday through Thursday may not be returned until the following business day. Phone messages received after 11:00 AM on Friday may not be returned until Monday.   After Hours We share on call  hours with providers from other offices. If you have an urgent need after hours that cannot wait until the next business day, please contact the on call provider by calling the office number. A nurse will speak with you and contact the provider if needed for recommendations.  If you have an urgent or life threatening emergency after hours, please do not call the on call provider, but seek emergency evaluation by calling 911 or going to the nearest emergency room for evaluation.   Paperwork All paperwork requires a minimum of 5 days to complete and return to you or the designated personnel. Please keep this in mind when bringing in forms or sending requests for paperwork completion to the office.

## 2021-10-10 NOTE — Progress Notes (Signed)
Bonnie Clamp, DNP, AGNP-c [facility] [address] [place], [place] [address] [phone] Office [phone] Fax  ESTABLISHED PATIENT- Chronic Health and/or Follow-Up Visit  Blood pressure 117/78, pulse 71, height 5' (1.524 m), weight 117 lb (53.1 kg), SpO2 96 %.  Follow-up (Patient presents today for cramps during the night from Crestor. )   HPI  Bonnie Sanders  is a 76 y.o. year old female presenting today for evaluation and management of the following: Nocturnal leg cramps Bonnie Sanders has been using Crestor for 2 years.  Initially she experienced mild cramping sporadically when she first started taking the medication.  However, over the past month, the frequency of these cramps has increased, occurring every night, with some instances of recurrent cramps throughout the night.  While the pain is mostly localized to her calves, she is also experienced discomfort in the shins of her legs recently.  Symptoms are primarily in the left leg but do occur bilaterally. She does not feel that she has been dehydrated recently and she has been eating her normal diet.  She has not tried any new medications recently.  She has not tried anything to improve the symptoms.  Nothing seems to make it worse. Paresthesias Bonnie Sanders has been experiencing numbness and tingling in her hands bilaterally for some time now. The symptoms are only present at night and often wake her up with pain. She reports  sensation of burning and tingling. Initially she thought this was due to the positioning of her arms while sleeping, but she has made changes to this and symptoms have not resolved.  She does have chronic neck pain and previous imaging showed degeneration in the cervical spine.  She tells me while she was going to physical therapy on a routine basis that the numbness and tingling in her hands did improve if not resolved completely.  She tells me that during the day  she has no sensation in her fingertips.  When she goes to fasten the buttons of her granddaughters clothing she is unable to feel if the snaps have been closed. She denies any new or worsening neck pain or back pain.  Her last thyroid check was in February and it was normal at that time.  Nothing seems to make it better or worse.  ROS All ROS negative with exception of what is listed in HPI  PHYSICAL EXAM Physical Exam Vitals and nursing note reviewed.  Constitutional:      Appearance: Normal appearance. She is normal weight.  HENT:     Head: Normocephalic.  Eyes:     Extraocular Movements: Extraocular movements intact.     Pupils: Pupils are equal, round, and reactive to light.  Cardiovascular:     Rate and Rhythm: Normal rate and regular rhythm.     Pulses: Normal pulses.     Heart sounds: Normal heart sounds.  Pulmonary:     Effort: Pulmonary effort is normal.     Breath sounds: Normal breath sounds.  Musculoskeletal:        General: No swelling or tenderness.     Cervical back: Normal range of motion.     Right lower leg: No edema.     Left lower leg: No edema.  Lymphadenopathy:     Cervical: No cervical adenopathy.  Skin:    General: Skin is warm and dry.     Capillary Refill: Capillary refill takes less than 2 seconds.  Neurological:  General: No focal deficit present.     Mental Status: She is alert and oriented to person, place, and time.     Sensory: Sensory deficit present.     Motor: No weakness.     Coordination: Coordination normal.  Psychiatric:        Mood and Affect: Mood normal.        Behavior: Behavior normal.        Thought Content: Thought content normal.        Judgment: Judgment normal.     ASSESSMENT & PLAN Problem List Items Addressed This Visit     Muscle cramping - Primary    Bilateral lower extremity cramping predominantly in the calves, with recent discomfort in the front of her leg.  Increased frequency of symptoms now occurring  every night.  Patient believes the symptoms are attributed to use of Crestor, which she has been taking for 2 years.  Physical exam of lower extremities benign.  No signs of inflammation, decreased strength, decreased muscle tone.  I we will plan to collect labs today for evaluation of electrolytes and thyroid. Recommend patient's stop rosuvastatin for at least the next 2 weeks and monitor if symptoms improve. Depending on lab findings and results of stopping the rosuvastatin, consider adjusting the dose or exploring alternative cholesterol-lowering medications. Patient advised to maintain adequate hydration, incorporate gentle stretching exercises, and monitor for new or worsening symptoms.  We will plan to follow-up in the next few weeks to determine if symptoms have improved.      Relevant Orders   Comprehensive metabolic panel   TSH   VITAMIN D 25 Hydroxy (Vit-D Deficiency, Fractures)   Paresthesias    Bilateral paresthesias to the hands, most notably in the fingertips.  Examination does show decreased sensation in the distal tips of the fingers bilaterally specifically the thumb and first 3 fingers.  Review of prior imaging today shows that there have been cervical spine changes that could be contributing to the paresthesias the patient is experiencing.  Discussed with patient the changes noted in the cervical spine on last imaging and how these could be contributing.  Suspect this is a worsening side effect of the degenerative cervical spine.  Patient did have positive Tinel's sign on evaluation today which can indicate possible carpal tunnel syndrome.  It is possible that the patient has 2 etiologies presenting causing the symptoms. Discussed with patient the recommendation for stretching exercises of the cervical spine and monitoring which she is laying in bed to avoid having her head in an unusual or unnatural position.  Also recommend wrist braces to help keep wrist in a neutral position while  sleeping.  She is undergoing physical therapy at this time therefore encouraged her to speak with her physical therapist about neck strengthening exercises that may be helpful.  If her symptoms fail to improve over the next few weeks with physical therapy we will plan to refer to orthopedics or hand specialist for further evaluation.      Relevant Orders   Comprehensive metabolic panel   TSH   VITAMIN D 25 Hydroxy (Vit-D Deficiency, Fractures)   Pain in both lower extremities   Relevant Orders   Comprehensive metabolic panel   TSH   VITAMIN D 25 Hydroxy (Vit-D Deficiency, Fractures)     FOLLOW-UP Return in about 4 weeks (around 11/07/2021) for Phone call to check if muscle pain has resolved. Bonnie Merritts Jerrick Farve, DNP, AGNP-c 10/10/2021  8:30 AM

## 2021-10-11 LAB — COMPREHENSIVE METABOLIC PANEL
ALT: 16 IU/L (ref 0–32)
AST: 20 IU/L (ref 0–40)
Albumin/Globulin Ratio: 2.2 (ref 1.2–2.2)
Albumin: 4.7 g/dL (ref 3.7–4.7)
Alkaline Phosphatase: 49 IU/L (ref 44–121)
BUN/Creatinine Ratio: 26 (ref 12–28)
BUN: 22 mg/dL (ref 8–27)
Bilirubin Total: 0.3 mg/dL (ref 0.0–1.2)
CO2: 24 mmol/L (ref 20–29)
Calcium: 10.4 mg/dL — ABNORMAL HIGH (ref 8.7–10.3)
Chloride: 102 mmol/L (ref 96–106)
Creatinine, Ser: 0.84 mg/dL (ref 0.57–1.00)
Globulin, Total: 2.1 g/dL (ref 1.5–4.5)
Glucose: 87 mg/dL (ref 70–99)
Potassium: 4.7 mmol/L (ref 3.5–5.2)
Sodium: 140 mmol/L (ref 134–144)
Total Protein: 6.8 g/dL (ref 6.0–8.5)
eGFR: 72 mL/min/{1.73_m2} (ref >59)

## 2021-10-11 LAB — VITAMIN D 25 HYDROXY (VIT D DEFICIENCY, FRACTURES): Vit D, 25-Hydroxy: 73.3 ng/mL (ref 30.0–100.0)

## 2021-10-11 LAB — TSH: TSH: 0.719 u[IU]/mL (ref 0.450–4.500)

## 2021-10-16 ENCOUNTER — Ambulatory Visit (INDEPENDENT_AMBULATORY_CARE_PROVIDER_SITE_OTHER): Payer: Medicare Other | Admitting: Family Medicine

## 2021-10-16 ENCOUNTER — Encounter (HOSPITAL_BASED_OUTPATIENT_CLINIC_OR_DEPARTMENT_OTHER): Payer: Self-pay | Admitting: Family Medicine

## 2021-10-16 VITALS — BP 123/74 | HR 66 | Ht 60.0 in | Wt 117.0 lb

## 2021-10-16 DIAGNOSIS — M25511 Pain in right shoulder: Secondary | ICD-10-CM | POA: Diagnosis not present

## 2021-10-16 DIAGNOSIS — G8929 Other chronic pain: Secondary | ICD-10-CM | POA: Diagnosis not present

## 2021-10-16 NOTE — Progress Notes (Signed)
    Procedures performed today:    None.  Independent interpretation of notes and tests performed by another provider:   None.  Brief History, Exam, Impression, and Recommendations:    BP 123/74   Pulse 66   Ht 5' (1.524 m)   Wt 117 lb (53.1 kg)   SpO2 100%   BMI 22.85 kg/m   Chronic right shoulder pain Bonnie Sanders is a 76 year old female presenting for follow-up of chronic right shoulder pain.  Generally she has been doing well, has completed treatment course with physical therapy downstairs here.  She reports that physical therapy went quite well, she has also been working on home exercises as per PT.  Unfortunately, she was lifting her grandchild's car seat out of the car and felt that she aggravated the shoulder some in doing so.  Current symptoms are primarily located in her back, medial to right scapula.  She indicates that whereas her pain previously had been more so around her shoulder itself, she is not currently having symptoms in that area. On exam, shoulder has good strength and range of motion.  She does have some tenderness to palpation along superior aspect of rhomboids along right side with small focal area of tenderness, firmness.  She does indicate that palpation over this area does reproduce her symptoms Feel current symptoms are most likely related to myofascial trigger point within right rhomboids.  Discussed general recommendations including conservative measures such as massage, ice or heat, home exercises.  Could consider working with physical therapy.  She would prefer to continue with home exercises We will plan for follow-up as needed, certainly if symptoms are worsening or not improving, recommend returning to the office for further evaluation and additional considerations  Return if symptoms worsen or fail to improve.   ___________________________________________ Bonnie Alberico de [place], MD, ABFM, CAQSM Primary Care and Sports Medicine [facility]

## 2021-10-16 NOTE — Assessment & Plan Note (Signed)
Bonnie Sanders is a 76 year old female presenting for follow-up of chronic right shoulder pain.  Generally she has been doing well, has completed treatment course with physical therapy downstairs here.  She reports that physical therapy went quite well, she has also been working on home exercises as per PT.  Unfortunately, she was lifting her grandchild's car seat out of the car and felt that she aggravated the shoulder some in doing so.  Current symptoms are primarily located in her back, medial to right scapula.  She indicates that whereas her pain previously had been more so around her shoulder itself, she is not currently having symptoms in that area. On exam, shoulder has good strength and range of motion.  She does have some tenderness to palpation along superior aspect of rhomboids along right side with small focal area of tenderness, firmness.  She does indicate that palpation over this area does reproduce her symptoms Feel current symptoms are most likely related to myofascial trigger point within right rhomboids.  Discussed general recommendations including conservative measures such as massage, ice or heat, home exercises.  Could consider working with physical therapy.  She would prefer to continue with home exercises We will plan for follow-up as needed, certainly if symptoms are worsening or not improving, recommend returning to the office for further evaluation and additional considerations

## 2021-10-16 NOTE — Patient Instructions (Signed)

## 2021-10-18 ENCOUNTER — Encounter (HOSPITAL_BASED_OUTPATIENT_CLINIC_OR_DEPARTMENT_OTHER): Payer: Medicare Other | Admitting: Physical Therapy

## 2021-10-29 ENCOUNTER — Other Ambulatory Visit (HOSPITAL_BASED_OUTPATIENT_CLINIC_OR_DEPARTMENT_OTHER): Payer: Self-pay

## 2021-10-29 ENCOUNTER — Other Ambulatory Visit (HOSPITAL_BASED_OUTPATIENT_CLINIC_OR_DEPARTMENT_OTHER): Payer: Self-pay | Admitting: Nurse Practitioner

## 2021-10-29 MED ORDER — ROSUVASTATIN CALCIUM 5 MG PO TABS
5.0000 mg | ORAL_TABLET | Freq: Every day | ORAL | 0 refills | Status: DC
  Filled 2021-10-29 – 2021-11-09 (×2): qty 90, 90d supply, fill #0

## 2021-10-29 MED ORDER — OMEPRAZOLE 20 MG PO CPDR
20.0000 mg | DELAYED_RELEASE_CAPSULE | Freq: Every day | ORAL | 3 refills | Status: DC
  Filled 2021-10-29: qty 90, 90d supply, fill #0
  Filled 2022-01-18: qty 30, 30d supply, fill #1

## 2021-10-29 MED ORDER — LEVOTHYROXINE SODIUM 75 MCG PO TABS
75.0000 ug | ORAL_TABLET | Freq: Every morning | ORAL | 3 refills | Status: DC
  Filled 2021-10-29: qty 90, 90d supply, fill #0
  Filled 2022-01-18: qty 30, 30d supply, fill #1

## 2021-11-09 ENCOUNTER — Other Ambulatory Visit (HOSPITAL_BASED_OUTPATIENT_CLINIC_OR_DEPARTMENT_OTHER): Payer: Self-pay

## 2021-11-14 ENCOUNTER — Ambulatory Visit (INDEPENDENT_AMBULATORY_CARE_PROVIDER_SITE_OTHER): Payer: Medicare Other | Admitting: Nurse Practitioner

## 2021-11-14 ENCOUNTER — Other Ambulatory Visit (HOSPITAL_BASED_OUTPATIENT_CLINIC_OR_DEPARTMENT_OTHER): Payer: Self-pay

## 2021-11-14 ENCOUNTER — Encounter (HOSPITAL_BASED_OUTPATIENT_CLINIC_OR_DEPARTMENT_OTHER): Payer: Self-pay | Admitting: Nurse Practitioner

## 2021-11-14 DIAGNOSIS — E785 Hyperlipidemia, unspecified: Secondary | ICD-10-CM | POA: Diagnosis not present

## 2021-11-14 DIAGNOSIS — M159 Polyosteoarthritis, unspecified: Secondary | ICD-10-CM

## 2021-11-14 DIAGNOSIS — I10 Essential (primary) hypertension: Secondary | ICD-10-CM | POA: Diagnosis not present

## 2021-11-14 MED ORDER — LISINOPRIL 10 MG PO TABS
10.0000 mg | ORAL_TABLET | Freq: Every day | ORAL | 3 refills | Status: DC
  Filled 2021-11-14: qty 90, 90d supply, fill #0
  Filled 2022-02-06: qty 90, 90d supply, fill #1

## 2021-11-14 NOTE — Assessment & Plan Note (Signed)
Chronic. Using diclofenac topical gel for treatment of neck and back- she has noticed it is helping with her bilateral hand pain. Recommend continued use for arthritic pain. F/U if symptoms worsen.

## 2021-11-14 NOTE — Assessment & Plan Note (Signed)
Chronic. Restart of rosuvastatin has not caused any additional leg cramping or pain. She is able to take nightly without issue. Recommend continued daily use. If pain or cramping returns, patient will contact the office and we can make changes to dosing schedule.

## 2021-11-14 NOTE — Assessment & Plan Note (Signed)
Chronic. Blood pressure well controlled at this time. She is taking medication as prescribed without side effects. Refills provided today for 1 year. F/U if BP increases or if any symptoms present.

## 2021-11-14 NOTE — Progress Notes (Signed)
Virtual Visit Encounter telephone visit.   I connected with  Bonnie Sanders on 12/19/21 at  8:30 AM EDT by secure audio telemedicine application. I verified that I am speaking with the correct person using two identifiers.   I introduced myself as a Publishing rights manager with the practice. The limitations of evaluation and management by telemedicine discussed with the patient and the availability of in person appointments. The patient expressed verbal understanding and consent to proceed.  Participating parties in this visit include: Myself and patient  The patient is: Patient Location: Home I am: Provider Location: Office/Clinic Subjective:    CC and HPI: Bonnie Sanders is a 76 y.o. year old female presenting for follow up of muscle pain related to statin therapy. Patient reports the following: She reports stopping the statin for about 2 weeks and her leg pain/cramping subsided. She tells me that after the pain resolved, she did restart the statin therapy and she has not had any pain, cramping, or muscle spasms in the legs since restarting. She would like to continue on the medication at this time.  She tells me she has been using diclofenac gel for her arthritis and this has been helping with her hand pain.   Past medical history, Surgical history, Family history not pertinant except as noted below, Social history, Allergies, and medications have been entered into the medical record, reviewed, and corrections made.   Review of Systems:  All review of systems negative except what is listed in the HPI  Objective:    Alert and oriented x 4 Speaking in clear sentences with no shortness of breath. No distress.  Impression and Recommendations:    Problem List Items Addressed This Visit     Hyperlipidemia with target low density lipoprotein (LDL) cholesterol less than 130 mg/dL    Chronic. Restart of rosuvastatin has not caused any additional leg cramping or pain. She is able to take  nightly without issue. Recommend continued daily use. If pain or cramping returns, patient will contact the office and we can make changes to dosing schedule.      Relevant Medications   lisinopril (ZESTRIL) 10 MG tablet   Hypertension    Chronic. Blood pressure well controlled at this time. She is taking medication as prescribed without side effects. Refills provided today for 1 year. F/U if BP increases or if any symptoms present.       Relevant Medications   lisinopril (ZESTRIL) 10 MG tablet   Osteoarthritis, multiple sites - Primary    Chronic. Using diclofenac topical gel for treatment of neck and back- she has noticed it is helping with her bilateral hand pain. Recommend continued use for arthritic pain. F/U if symptoms worsen.        current treatment plan is effective, no change in therapy I discussed the assessment and treatment plan with the patient. The patient was provided an opportunity to ask questions and all were answered. The patient agreed with the plan and demonstrated an understanding of the instructions.   The patient was advised to call back or seek an in-person evaluation if the symptoms worsen or if the condition fails to improve as anticipated.  Follow-Up: in 6 months  I provided 28 minutes of non-face-to-face interaction with this non face-to-face encounter including intake, same-day documentation, and chart review.   [name], NP , DNP, AGNP-c [facility] Primary [facility] Medicine at [facility] [phone] (fax)

## 2021-11-19 ENCOUNTER — Other Ambulatory Visit (HOSPITAL_BASED_OUTPATIENT_CLINIC_OR_DEPARTMENT_OTHER): Payer: Self-pay

## 2021-11-27 ENCOUNTER — Other Ambulatory Visit (HOSPITAL_BASED_OUTPATIENT_CLINIC_OR_DEPARTMENT_OTHER): Payer: Self-pay

## 2021-12-13 ENCOUNTER — Other Ambulatory Visit (HOSPITAL_BASED_OUTPATIENT_CLINIC_OR_DEPARTMENT_OTHER): Payer: Self-pay

## 2021-12-13 MED ORDER — AREXVY 120 MCG/0.5ML IM SUSR
INTRAMUSCULAR | 0 refills | Status: DC
  Filled 2021-12-13: qty 0.5, 1d supply, fill #0

## 2021-12-13 MED ORDER — INFLUENZA VAC A&B SA ADJ QUAD 0.5 ML IM PRSY
PREFILLED_SYRINGE | INTRAMUSCULAR | 0 refills | Status: DC
  Filled 2021-12-13: qty 0.5, 1d supply, fill #0

## 2021-12-14 ENCOUNTER — Other Ambulatory Visit (HOSPITAL_BASED_OUTPATIENT_CLINIC_OR_DEPARTMENT_OTHER): Payer: Self-pay

## 2021-12-19 ENCOUNTER — Encounter (HOSPITAL_BASED_OUTPATIENT_CLINIC_OR_DEPARTMENT_OTHER): Payer: Self-pay | Admitting: Nurse Practitioner

## 2022-01-18 ENCOUNTER — Other Ambulatory Visit (HOSPITAL_BASED_OUTPATIENT_CLINIC_OR_DEPARTMENT_OTHER): Payer: Self-pay

## 2022-02-01 ENCOUNTER — Other Ambulatory Visit (HOSPITAL_BASED_OUTPATIENT_CLINIC_OR_DEPARTMENT_OTHER): Payer: Self-pay | Admitting: Nurse Practitioner

## 2022-02-01 ENCOUNTER — Other Ambulatory Visit (HOSPITAL_BASED_OUTPATIENT_CLINIC_OR_DEPARTMENT_OTHER): Payer: Self-pay

## 2022-02-01 MED ORDER — ROSUVASTATIN CALCIUM 5 MG PO TABS
5.0000 mg | ORAL_TABLET | Freq: Every day | ORAL | 0 refills | Status: DC
  Filled 2022-02-01: qty 90, 90d supply, fill #0

## 2022-02-06 ENCOUNTER — Other Ambulatory Visit (HOSPITAL_BASED_OUTPATIENT_CLINIC_OR_DEPARTMENT_OTHER): Payer: Self-pay

## 2022-02-08 ENCOUNTER — Other Ambulatory Visit: Payer: Self-pay | Admitting: Family Medicine

## 2022-02-08 DIAGNOSIS — Z1231 Encounter for screening mammogram for malignant neoplasm of breast: Secondary | ICD-10-CM

## 2022-02-15 ENCOUNTER — Other Ambulatory Visit (HOSPITAL_BASED_OUTPATIENT_CLINIC_OR_DEPARTMENT_OTHER): Payer: Self-pay | Admitting: Nurse Practitioner

## 2022-02-15 ENCOUNTER — Other Ambulatory Visit (HOSPITAL_BASED_OUTPATIENT_CLINIC_OR_DEPARTMENT_OTHER): Payer: Self-pay

## 2022-02-18 ENCOUNTER — Encounter (HOSPITAL_BASED_OUTPATIENT_CLINIC_OR_DEPARTMENT_OTHER): Payer: Self-pay | Admitting: Nurse Practitioner

## 2022-02-18 ENCOUNTER — Other Ambulatory Visit (HOSPITAL_BASED_OUTPATIENT_CLINIC_OR_DEPARTMENT_OTHER): Payer: Self-pay

## 2022-02-18 ENCOUNTER — Ambulatory Visit (INDEPENDENT_AMBULATORY_CARE_PROVIDER_SITE_OTHER): Payer: Medicare Other | Admitting: Nurse Practitioner

## 2022-02-18 VITALS — BP 104/78 | HR 76 | Temp 98.4°F | Ht 59.75 in | Wt 114.0 lb

## 2022-02-18 DIAGNOSIS — E039 Hypothyroidism, unspecified: Secondary | ICD-10-CM

## 2022-02-18 DIAGNOSIS — Z13228 Encounter for screening for other metabolic disorders: Secondary | ICD-10-CM

## 2022-02-18 DIAGNOSIS — K2100 Gastro-esophageal reflux disease with esophagitis, without bleeding: Secondary | ICD-10-CM

## 2022-02-18 DIAGNOSIS — E785 Hyperlipidemia, unspecified: Secondary | ICD-10-CM

## 2022-02-18 DIAGNOSIS — F959 Tic disorder, unspecified: Secondary | ICD-10-CM | POA: Diagnosis not present

## 2022-02-18 DIAGNOSIS — Z1329 Encounter for screening for other suspected endocrine disorder: Secondary | ICD-10-CM

## 2022-02-18 DIAGNOSIS — I10 Essential (primary) hypertension: Secondary | ICD-10-CM

## 2022-02-18 DIAGNOSIS — F5101 Primary insomnia: Secondary | ICD-10-CM

## 2022-02-18 DIAGNOSIS — Z1321 Encounter for screening for nutritional disorder: Secondary | ICD-10-CM | POA: Diagnosis not present

## 2022-02-18 DIAGNOSIS — Z130 Encounter for screening for diseases of the blood and blood-forming organs and certain disorders involving the immune mechanism: Secondary | ICD-10-CM

## 2022-02-18 MED ORDER — LISINOPRIL 10 MG PO TABS
10.0000 mg | ORAL_TABLET | Freq: Every day | ORAL | 3 refills | Status: DC
  Filled 2022-02-18 – 2022-05-07 (×2): qty 90, 90d supply, fill #0
  Filled 2022-05-23 – 2022-08-13 (×2): qty 90, 90d supply, fill #1

## 2022-02-18 MED ORDER — FENOFIBRATE 54 MG PO TABS
54.0000 mg | ORAL_TABLET | Freq: Every day | ORAL | 3 refills | Status: DC
  Filled 2022-02-18: qty 45, 90d supply, fill #0
  Filled 2022-02-18: qty 90, 90d supply, fill #0
  Filled 2022-05-23 (×2): qty 90, 90d supply, fill #1

## 2022-02-18 MED ORDER — AMITRIPTYLINE HCL 150 MG PO TABS
150.0000 mg | ORAL_TABLET | Freq: Every day | ORAL | 3 refills | Status: DC
  Filled 2022-02-18: qty 90, 90d supply, fill #0
  Filled 2022-05-23: qty 90, 90d supply, fill #1
  Filled 2022-08-04: qty 90, 90d supply, fill #2

## 2022-02-18 MED ORDER — LEVOTHYROXINE SODIUM 75 MCG PO TABS
75.0000 ug | ORAL_TABLET | Freq: Every morning | ORAL | 3 refills | Status: DC
  Filled 2022-02-18: qty 90, 90d supply, fill #0
  Filled 2022-05-23: qty 30, 30d supply, fill #1
  Filled 2022-05-23: qty 90, 90d supply, fill #1
  Filled 2022-06-20: qty 30, 30d supply, fill #2
  Filled 2022-07-10: qty 30, 30d supply, fill #3
  Filled 2022-07-20: qty 90, 90d supply, fill #3

## 2022-02-18 MED ORDER — ROSUVASTATIN CALCIUM 5 MG PO TABS
5.0000 mg | ORAL_TABLET | Freq: Every day | ORAL | 3 refills | Status: DC
  Filled 2022-02-18 – 2022-05-07 (×2): qty 90, 90d supply, fill #0
  Filled 2022-05-23 – 2022-08-13 (×2): qty 90, 90d supply, fill #1

## 2022-02-18 MED ORDER — OMEPRAZOLE 20 MG PO CPDR
20.0000 mg | DELAYED_RELEASE_CAPSULE | Freq: Every day | ORAL | 3 refills | Status: DC
  Filled 2022-02-18: qty 90, 90d supply, fill #0
  Filled 2022-05-23 (×2): qty 90, 90d supply, fill #1

## 2022-02-18 NOTE — Assessment & Plan Note (Signed)
chronic. Exam normal today. current treatment plan effective, no change in therapy and lab orders as documented in EMR. Currently is taking medication. Currently is not followed by endocrinology. No alarm symptoms present at this time. Recommend taking medication on an empty stomach 30 minutes to 1 hour before food or other medications for best absorption. Follow up in 6months or sooner if needed based on lab results and symptoms.     

## 2022-02-18 NOTE — Progress Notes (Signed)
Worthy Keeler, DNP, AGNP-c [facility] [address] [place], [place] [address] [phone] Office [phone] Fax  ESTABLISHED PATIENT- Chronic Health and/or Follow-Up Visit  Blood pressure 104/78, pulse 76, temperature 98.4 F (36.9 C), height 4' 11.75" (1.518 m), weight 114 lb (51.7 kg), SpO2 100 %.    Bonnie Sanders is a 76 y.o. year old female presenting today for evaluation and management of the following: Hypertension, Hyperlipidemia, and Hypothyroidism   Hypertension/Hyperlipidemia, follow-up  BP Readings from Last 3 Encounters:  02/18/22 104/78  10/16/21 123/74  10/10/21 117/78   Wt Readings from Last 3 Encounters:  02/18/22 114 lb (51.7 kg)  10/16/21 117 lb (53.1 kg)  10/10/21 117 lb (53.1 kg)     She was last seen for hypertension 6 months ago.   She reports excellent compliance with treatment. She is not having side effects.  She is following a Regular diet. She is not exercising. She does not smoke.  Use of agents associated with hypertension: none.   Outside blood pressures are Less than 130/80 . Symptoms: No chest pain No chest pressure  No palpitations No syncope  No dyspnea No orthopnea  No paroxysmal nocturnal dyspnea No lower extremity edema   Pertinent labs Lab Results  Component Value Date   CHOL 199 11/30/2010   HDL 40 11/30/2010   LDLCALC 96 11/30/2010   TRIG 313 (H) 11/30/2010   CHOLHDL 5.0 11/30/2010   Lab Results  Component Value Date   NA 140 10/10/2021   K 4.7 10/10/2021   CREATININE 0.84 10/10/2021   EGFR 72 10/10/2021   GLUCOSE 87 10/10/2021   TSH 0.719 10/10/2021     The ASCVD Risk score (Arnett DK, et al., 2019) failed to calculate for the following reasons:   Cannot find a previous HDL lab   Cannot find a previous total cholesterol lab  --------------------------------------------------------------------------------------------------- Hypothyroid, follow-up  Lab  Results  Component Value Date   TSH 0.719 10/10/2021   TSH 2.365 06/19/2011   TSH 9.904 (H) 04/29/2011    Wt Readings from Last 3 Encounters:  02/18/22 114 lb (51.7 kg)  10/16/21 117 lb (53.1 kg)  10/10/21 117 lb (53.1 kg)    She was last seen for hypothyroid 6 months ago.  Management since that visit includes levothyroxine 48mg daily. She reports excellent compliance with treatment. She is not having side effects.   Symptoms: No change in energy level No constipation  No diarrhea Yes heat / cold intolerance  No nervousness No palpitations  Yes weight changes    -----------------------------------------------------------------------------------------   All ROS negative with exception of what is listed above.   PHYSICAL EXAM Physical Exam Vitals and nursing note reviewed.  Constitutional:      General: She is not in acute distress.    Appearance: Normal appearance.  HENT:     Head: Normocephalic.  Eyes:     Extraocular Movements: Extraocular movements intact.     Conjunctiva/sclera: Conjunctivae normal.     Pupils: Pupils are equal, round, and reactive to light.  Neck:     Vascular: No carotid bruit.  Cardiovascular:     Rate and Rhythm: Normal rate and regular rhythm.     Pulses: Normal pulses.     Heart sounds: Normal heart sounds. No murmur heard. Pulmonary:     Effort: Pulmonary effort is normal.     Breath sounds: Normal breath sounds. No wheezing.  Abdominal:     General: Bowel sounds are normal.  There is no distension.     Palpations: Abdomen is soft.     Tenderness: There is no abdominal tenderness. There is no guarding.  Musculoskeletal:        General: Normal range of motion.     Cervical back: Normal range of motion and neck supple.     Right lower leg: No edema.     Left lower leg: No edema.  Lymphadenopathy:     Cervical: No cervical adenopathy.  Skin:    General: Skin is warm and dry.     Capillary Refill: Capillary refill takes less than 2  seconds.  Neurological:     General: No focal deficit present.     Mental Status: She is alert and oriented to person, place, and time.  Psychiatric:        Mood and Affect: Mood normal.        Behavior: Behavior normal.        Thought Content: Thought content normal.        Judgment: Judgment normal.     PLAN Problem List Items Addressed This Visit     Hypothyroid    chronic. Exam normal today. current treatment plan effective, no change in therapy and lab orders as documented in EMR. Currently is taking medication. Currently is not followed by endocrinology. No alarm symptoms present at this time. Recommend taking medication on an empty stomach 30 minutes to 1 hour before food or other medications for best absorption. Follow up in 35month or sooner if needed based on lab results and symptoms.         Relevant Medications   levothyroxine (SYNTHROID) 75 MCG tablet   Other Relevant Orders   CBC with Differential/Platelet   Comprehensive metabolic panel   Lipid panel   VITAMIN D 25 Hydroxy (Vit-D Deficiency, Fractures)   Hemoglobin A1c   TSH   T4, free   Hyperlipidemia with target low density lipoprotein (LDL) cholesterol less than 130 mg/dL    Chronic. Controlled with  statin therapy . Continue current lipid-lowering therapy.. Labs ordered today. Recommend  walking daily and monitoring diet . Diet and exercise recommendations provided. Follow-up in 657monthor sooner based on lab findings as appropriate.        Relevant Medications   fenofibrate 54 MG tablet   lisinopril (ZESTRIL) 10 MG tablet   rosuvastatin (CRESTOR) 5 MG tablet   Other Relevant Orders   CBC with Differential/Platelet   Comprehensive metabolic panel   Lipid panel   VITAMIN D 25 Hydroxy (Vit-D Deficiency, Fractures)   Hemoglobin A1c   TSH   T4, free   Hypertension - Primary    Chronic.  No alarm symptoms present at this time.  Goal blood pressure less than less than 130/80.  Refills have been provided  today.  Labs today to check electrolytes and kidney function.  Recommend current treatment plan is effective, no change in therapy, orders and follow up as documented in EpicCare, reviewed diet, exercise and weight control, the following changes are made - Labs ordered. Currently is not followed with cardiology. We will make changes to plan of care as necessary based on lab results. Plan to follow-up in 74m61month      Relevant Medications   fenofibrate 54 MG tablet   lisinopril (ZESTRIL) 10 MG tablet   rosuvastatin (CRESTOR) 5 MG tablet   Other Relevant Orders   CBC with Differential/Platelet   Comprehensive metabolic panel   Lipid panel   VITAMIN D 25 Hydroxy (  Vit-D Deficiency, Fractures)   Hemoglobin A1c   TSH   T4, free   Gastro-esophageal reflux disease with esophagitis    Chronic. Alarm symptoms are not present at this time. Patient is not experiencing signs of bleeding and is not having new or worsening symptoms. Plan to continue current therapy and diet modifications. F/U if new or worsening symptoms present.         Relevant Medications   omeprazole (PRILOSEC) 20 MG capsule   Other Relevant Orders   CBC with Differential/Platelet   Tic disorder, unspecified    Chronic. Stable. No alarm symptoms or changes needed. Will monitor.       Relevant Medications   amitriptyline (ELAVIL) 150 MG tablet   Other Relevant Orders   TSH   T4, free   Other Visit Diagnoses     Screening for endocrine, nutritional, metabolic and immunity disorder       Relevant Orders   CBC with Differential/Platelet   Comprehensive metabolic panel   Lipid panel   VITAMIN D 25 Hydroxy (Vit-D Deficiency, Fractures)   Hemoglobin A1c   TSH   T4, free   Primary insomnia       Relevant Medications   amitriptyline (ELAVIL) 150 MG tablet       Return in about 6 months (around 08/19/2022) for HTN, HLD, Hypothyroid, Labs, Med Review (extra time).   Worthy Keeler, DNP, AGNP-c 02/18/2022  8:23  AM

## 2022-02-18 NOTE — Assessment & Plan Note (Signed)
Chronic. Stable. No alarm symptoms or changes needed. Will monitor.

## 2022-02-18 NOTE — Assessment & Plan Note (Signed)
Chronic. Alarm symptoms are not present at this time. Patient is not experiencing signs of bleeding and is not having new or worsening symptoms. Plan to continue current therapy and diet modifications. F/U if new or worsening symptoms present.

## 2022-02-18 NOTE — Patient Instructions (Addendum)
It was a pleasure to see you today, Bonnie Sanders! Keep taking good care of yourself and the baby!   I will check your labs today to make sure your kidney function, liver function , blood cells, electrolytes, and thyroid levels are all looking very good.  I will be in touch with you to let you know what the labs show.   If you would like to follow me to [facility] you can call the office at [phone] to schedule your follow-up in 6 months. If you would like to stay here at [facility], Tamela Oddi can schedule your follow-up with him when you check-out today.   I have sent in refills on all of your medications for 90 days. If we need to make changes to your medications I will let you know.

## 2022-02-18 NOTE — Assessment & Plan Note (Signed)
Chronic. Controlled with  statin therapy . Continue current lipid-lowering therapy.. Labs ordered today. Recommend  walking daily and monitoring diet . Diet and exercise recommendations provided. Follow-up in 62months or sooner based on lab findings as appropriate.

## 2022-02-18 NOTE — Assessment & Plan Note (Signed)
Chronic.  No alarm symptoms present at this time.  Goal blood pressure less than less than 130/80.  Refills have been provided today.  Labs today to check electrolytes and kidney function.  Recommend current treatment plan is effective, no change in therapy, orders and follow up as documented in Epic[facility], reviewed diet, exercise and weight control, the following changes are made - Labs ordered. Currently is not followed with cardiology. We will make changes to plan of care as necessary based on lab results. Plan to follow-up in 59months.

## 2022-02-19 LAB — CBC WITH DIFFERENTIAL/PLATELET
Basophils Absolute: 0 10*3/uL (ref 0.0–0.2)
Basos: 1 %
EOS (ABSOLUTE): 0.1 10*3/uL (ref 0.0–0.4)
Eos: 3 %
Hematocrit: 34.8 % (ref 34.0–46.6)
Hemoglobin: 11.4 g/dL (ref 11.1–15.9)
Immature Grans (Abs): 0 10*3/uL (ref 0.0–0.1)
Immature Granulocytes: 0 %
Lymphocytes Absolute: 1.4 10*3/uL (ref 0.7–3.1)
Lymphs: 31 %
MCH: 30.1 pg (ref 26.6–33.0)
MCHC: 32.8 g/dL (ref 31.5–35.7)
MCV: 92 fL (ref 79–97)
Monocytes Absolute: 0.4 10*3/uL (ref 0.1–0.9)
Monocytes: 9 %
Neutrophils Absolute: 2.6 10*3/uL (ref 1.4–7.0)
Neutrophils: 56 %
Platelets: 238 10*3/uL (ref 150–450)
RBC: 3.79 x10E6/uL (ref 3.77–5.28)
RDW: 12.3 % (ref 11.7–15.4)
WBC: 4.6 10*3/uL (ref 3.4–10.8)

## 2022-02-19 LAB — LIPID PANEL
Chol/HDL Ratio: 3.3 ratio (ref 0.0–4.4)
Cholesterol, Total: 179 mg/dL (ref 100–199)
HDL: 54 mg/dL (ref >39)
LDL Chol Calc (NIH): 101 mg/dL — ABNORMAL HIGH (ref 0–99)
Triglycerides: 134 mg/dL (ref 0–149)
VLDL Cholesterol Cal: 24 mg/dL (ref 5–40)

## 2022-02-19 LAB — COMPREHENSIVE METABOLIC PANEL
ALT: 16 IU/L (ref 0–32)
AST: 21 IU/L (ref 0–40)
Albumin/Globulin Ratio: 2.1 (ref 1.2–2.2)
Albumin: 4.7 g/dL (ref 3.8–4.8)
Alkaline Phosphatase: 42 IU/L — ABNORMAL LOW (ref 44–121)
BUN/Creatinine Ratio: 27 (ref 12–28)
BUN: 29 mg/dL — ABNORMAL HIGH (ref 8–27)
Bilirubin Total: 0.3 mg/dL (ref 0.0–1.2)
CO2: 25 mmol/L (ref 20–29)
Calcium: 11.4 mg/dL — ABNORMAL HIGH (ref 8.7–10.3)
Chloride: 102 mmol/L (ref 96–106)
Creatinine, Ser: 1.08 mg/dL — ABNORMAL HIGH (ref 0.57–1.00)
Globulin, Total: 2.2 g/dL (ref 1.5–4.5)
Glucose: 100 mg/dL — ABNORMAL HIGH (ref 70–99)
Potassium: 5.4 mmol/L — ABNORMAL HIGH (ref 3.5–5.2)
Sodium: 140 mmol/L (ref 134–144)
Total Protein: 6.9 g/dL (ref 6.0–8.5)
eGFR: 53 mL/min/{1.73_m2} — ABNORMAL LOW (ref >59)

## 2022-02-19 LAB — T4, FREE: Free T4: 1.67 ng/dL (ref 0.82–1.77)

## 2022-02-19 LAB — VITAMIN D 25 HYDROXY (VIT D DEFICIENCY, FRACTURES): Vit D, 25-Hydroxy: 81.5 ng/mL (ref 30.0–100.0)

## 2022-02-19 LAB — TSH: TSH: 3.26 u[IU]/mL (ref 0.450–4.500)

## 2022-02-19 LAB — HEMOGLOBIN A1C
Est. average glucose Bld gHb Est-mCnc: 123 mg/dL
Hgb A1c MFr Bld: 5.9 % — ABNORMAL HIGH (ref 4.8–5.6)

## 2022-03-04 ENCOUNTER — Telehealth: Payer: Self-pay | Admitting: Nurse Practitioner

## 2022-03-04 NOTE — Telephone Encounter (Signed)
Pt called and states that she is going to stay with you. Please advise when you would like pt to return and what she needs today in response to labs in the mean time. Pt can be reach at [phone].

## 2022-03-07 ENCOUNTER — Telehealth: Payer: Self-pay | Admitting: Nurse Practitioner

## 2022-03-07 NOTE — Telephone Encounter (Signed)
Pt called and she seen her lab result comments. She does want to follow you to this office and is ok with repeating her blood work. Is it ok for me to schedule her for repeat blood work now?

## 2022-03-11 NOTE — Telephone Encounter (Signed)
Ok to schedule repeat labs for sometime this week. Thank you!

## 2022-03-11 NOTE — Telephone Encounter (Signed)
She is scheduled for tomorrow , thank you!

## 2022-03-12 ENCOUNTER — Other Ambulatory Visit: Payer: Self-pay | Admitting: Nurse Practitioner

## 2022-03-12 ENCOUNTER — Telehealth: Payer: Self-pay

## 2022-03-12 ENCOUNTER — Other Ambulatory Visit: Payer: Medicare Other

## 2022-03-12 DIAGNOSIS — E875 Hyperkalemia: Secondary | ICD-10-CM | POA: Diagnosis not present

## 2022-03-12 NOTE — Telephone Encounter (Signed)
Pt. Came in today for repeat labs but there were no orders in if you could put the orders in for what you wanted rechecked. Thanks. I looked in her labs and didn't see what specific test you wanted rechecked.

## 2022-03-13 LAB — COMPREHENSIVE METABOLIC PANEL
ALT: 16 IU/L (ref 0–32)
AST: 27 IU/L (ref 0–40)
Albumin/Globulin Ratio: 2.8 — ABNORMAL HIGH (ref 1.2–2.2)
Albumin: 5.3 g/dL — ABNORMAL HIGH (ref 3.8–4.8)
Alkaline Phosphatase: 44 IU/L (ref 44–121)
BUN/Creatinine Ratio: 25 (ref 12–28)
BUN: 24 mg/dL (ref 8–27)
Bilirubin Total: 0.3 mg/dL (ref 0.0–1.2)
CO2: 23 mmol/L (ref 20–29)
Calcium: 11.5 mg/dL — ABNORMAL HIGH (ref 8.7–10.3)
Chloride: 101 mmol/L (ref 96–106)
Creatinine, Ser: 0.96 mg/dL (ref 0.57–1.00)
Globulin, Total: 1.9 g/dL (ref 1.5–4.5)
Glucose: 96 mg/dL (ref 70–99)
Potassium: 4.7 mmol/L (ref 3.5–5.2)
Sodium: 141 mmol/L (ref 134–144)
Total Protein: 7.2 g/dL (ref 6.0–8.5)
eGFR: 61 mL/min/{1.73_m2} (ref >59)

## 2022-04-02 ENCOUNTER — Other Ambulatory Visit (HOSPITAL_BASED_OUTPATIENT_CLINIC_OR_DEPARTMENT_OTHER): Payer: Self-pay

## 2022-04-02 ENCOUNTER — Encounter: Payer: Self-pay | Admitting: Nurse Practitioner

## 2022-04-02 ENCOUNTER — Telehealth (INDEPENDENT_AMBULATORY_CARE_PROVIDER_SITE_OTHER): Payer: Medicare Other | Admitting: Nurse Practitioner

## 2022-04-02 VITALS — Ht 59.5 in | Wt 114.0 lb

## 2022-04-02 DIAGNOSIS — R052 Subacute cough: Secondary | ICD-10-CM | POA: Diagnosis not present

## 2022-04-02 DIAGNOSIS — J0140 Acute pansinusitis, unspecified: Secondary | ICD-10-CM | POA: Diagnosis not present

## 2022-04-02 MED ORDER — PSEUDOEPH-BROMPHEN-DM 30-2-10 MG/5ML PO SYRP
2.5000 mL | ORAL_SOLUTION | Freq: Three times a day (TID) | ORAL | 0 refills | Status: DC | PRN
  Filled 2022-04-02: qty 120, 16d supply, fill #0

## 2022-04-02 MED ORDER — AZITHROMYCIN 250 MG PO TABS
ORAL_TABLET | ORAL | 0 refills | Status: AC
  Filled 2022-04-02: qty 6, 5d supply, fill #0

## 2022-04-02 NOTE — Progress Notes (Signed)
Virtual Visit Encounter mychart visit.   I connected with  Bonnie Sanders on 04/03/22 at  3:45 PM EST by secure video and audio telemedicine application. I verified that I am speaking with the correct person using two identifiers.   I introduced myself as a Publishing rights manager with the practice. The limitations of evaluation and management by telemedicine discussed with the patient and the availability of in person appointments. The patient expressed verbal understanding and consent to proceed.  Participating parties in this visit include: Myself and patient  The patient is: Patient Location: Home I am: Provider Location: Office/Clinic Subjective:    CC and HPI: Bonnie Sanders is a 76 y.o. year old female presenting for new evaluation and treatment of cough symptoms. Patient reports the following: Bonnie Sanders tells me that her symptoms started with a cough several weeks ago, but she thought this was having allergy symptoms. The symptoms have worsened now and she is having increased mucous production, worsening cough, chills, and feeling lethargic. She tells me mucous is yellow in coloration.    Past medical history, Surgical history, Family history not pertinant except as noted below, Social history, Allergies, and medications have been entered into the medical record, reviewed, and corrections made.   Review of Systems:  All review of systems negative except what is listed in the HPI  Objective:    Alert and oriented x 4 Audible congestion and cough present Speaking in clear sentences with no shortness of breath. No distress.  Impression and Recommendations:    Problem List Items Addressed This Visit     Acute non-recurrent pansinusitis - Primary    Symptoms and presentation consistent with sinusitis given the length of time symptoms have been present, will treat with azithromycin. Recommend supportive measures with rest, hydration, mucinex, tylenol, etc. F/U if no improvement at  treatment completion.       Relevant Medications   azithromycin (ZITHROMAX) 250 MG tablet   brompheniramine-pseudoephedrine-DM 30-2-10 MG/5ML syrup   Other Visit Diagnoses     Subacute cough       Relevant Medications   azithromycin (ZITHROMAX) 250 MG tablet   brompheniramine-pseudoephedrine-DM 30-2-10 MG/5ML syrup       orders and follow up as documented in EMR I discussed the assessment and treatment plan with the patient. The patient was provided an opportunity to ask questions and all were answered. The patient agreed with the plan and demonstrated an understanding of the instructions.   The patient was advised to call back or seek an in-person evaluation if the symptoms worsen or if the condition fails to improve as anticipated.  Follow-Up: if no improvement with completion of treatment  I provided 26 minutes of non-face-to-face interaction with this non face-to-face encounter including intake, same-day documentation, and chart review.   [name], NP , DNP, AGNP-c [facility]

## 2022-04-03 ENCOUNTER — Other Ambulatory Visit (HOSPITAL_BASED_OUTPATIENT_CLINIC_OR_DEPARTMENT_OTHER): Payer: Self-pay

## 2022-04-03 DIAGNOSIS — J0140 Acute pansinusitis, unspecified: Secondary | ICD-10-CM | POA: Insufficient documentation

## 2022-04-03 NOTE — Assessment & Plan Note (Signed)
Symptoms and presentation consistent with sinusitis given the length of time symptoms have been present, will treat with azithromycin. Recommend supportive measures with rest, hydration, mucinex, tylenol, etc. F/U if no improvement at treatment completion.

## 2022-04-05 ENCOUNTER — Other Ambulatory Visit (HOSPITAL_BASED_OUTPATIENT_CLINIC_OR_DEPARTMENT_OTHER): Payer: Self-pay

## 2022-04-09 ENCOUNTER — Ambulatory Visit
Admission: RE | Admit: 2022-04-09 | Discharge: 2022-04-09 | Disposition: A | Discharge status: Home / Self Care | Payer: Medicare Other | Source: Ambulatory Visit | Attending: Family Medicine | Admitting: Family Medicine

## 2022-04-09 ENCOUNTER — Other Ambulatory Visit (HOSPITAL_BASED_OUTPATIENT_CLINIC_OR_DEPARTMENT_OTHER): Payer: Self-pay

## 2022-04-09 DIAGNOSIS — Z1231 Encounter for screening mammogram for malignant neoplasm of breast: Secondary | ICD-10-CM

## 2022-04-11 ENCOUNTER — Other Ambulatory Visit: Payer: Self-pay | Admitting: Family Medicine

## 2022-04-11 DIAGNOSIS — R928 Other abnormal and inconclusive findings on diagnostic imaging of breast: Secondary | ICD-10-CM

## 2022-04-17 ENCOUNTER — Other Ambulatory Visit: Payer: Self-pay | Admitting: Nurse Practitioner

## 2022-04-17 DIAGNOSIS — R928 Other abnormal and inconclusive findings on diagnostic imaging of breast: Secondary | ICD-10-CM

## 2022-04-18 ENCOUNTER — Other Ambulatory Visit: Payer: Medicare Other

## 2022-04-25 ENCOUNTER — Ambulatory Visit
Admission: RE | Admit: 2022-04-25 | Discharge: 2022-04-25 | Disposition: A | Discharge status: Home / Self Care | Payer: Medicare Other | Source: Ambulatory Visit | Attending: Nurse Practitioner | Admitting: Nurse Practitioner

## 2022-04-25 ENCOUNTER — Ambulatory Visit: Payer: Medicare Other

## 2022-04-25 DIAGNOSIS — R922 Inconclusive mammogram: Secondary | ICD-10-CM | POA: Diagnosis not present

## 2022-04-25 DIAGNOSIS — R928 Other abnormal and inconclusive findings on diagnostic imaging of breast: Secondary | ICD-10-CM

## 2022-05-07 ENCOUNTER — Other Ambulatory Visit (HOSPITAL_BASED_OUTPATIENT_CLINIC_OR_DEPARTMENT_OTHER): Payer: Self-pay

## 2022-05-22 ENCOUNTER — Telehealth: Payer: Self-pay

## 2022-05-22 NOTE — Telephone Encounter (Signed)
Pt. Called wanting to know if we could put in a referral for a colonoscopy for her. It has been over 10 years and she would like to go to a new office her in [place] that is in the [place] system. If ok I can out the referral in the system.

## 2022-05-23 ENCOUNTER — Other Ambulatory Visit (HOSPITAL_BASED_OUTPATIENT_CLINIC_OR_DEPARTMENT_OTHER): Payer: Self-pay

## 2022-05-23 ENCOUNTER — Other Ambulatory Visit: Payer: Self-pay | Admitting: Nurse Practitioner

## 2022-05-23 ENCOUNTER — Other Ambulatory Visit: Payer: Self-pay

## 2022-05-23 DIAGNOSIS — Z1211 Encounter for screening for malignant neoplasm of colon: Secondary | ICD-10-CM

## 2022-05-23 MED ORDER — FLUTICASONE PROPIONATE 50 MCG/ACT NA SUSP
2.0000 | Freq: Every day | NASAL | 3 refills | Status: DC
  Filled 2022-05-23: qty 16, 30d supply, fill #0
  Filled 2022-07-10: qty 16, 30d supply, fill #1
  Filled 2022-11-12: qty 16, 30d supply, fill #2
  Filled 2023-01-16: qty 16, 30d supply, fill #3

## 2022-05-23 NOTE — Telephone Encounter (Signed)
I just received a message from [facility] that pt was discharged from their practice and is unable to return. I have reached back to see if this dismissal is all [place] or just the [place] office. If it was for all, this will have to be sent outside of [facility] but still in [place]

## 2022-05-24 ENCOUNTER — Other Ambulatory Visit (HOSPITAL_BASED_OUTPATIENT_CLINIC_OR_DEPARTMENT_OTHER): Payer: Self-pay

## 2022-05-30 ENCOUNTER — Other Ambulatory Visit: Payer: Self-pay

## 2022-05-30 DIAGNOSIS — Z1211 Encounter for screening for malignant neoplasm of colon: Secondary | ICD-10-CM

## 2022-06-11 ENCOUNTER — Other Ambulatory Visit (HOSPITAL_BASED_OUTPATIENT_CLINIC_OR_DEPARTMENT_OTHER): Payer: Self-pay

## 2022-06-20 ENCOUNTER — Other Ambulatory Visit (HOSPITAL_BASED_OUTPATIENT_CLINIC_OR_DEPARTMENT_OTHER): Payer: Self-pay

## 2022-07-01 DIAGNOSIS — I10 Essential (primary) hypertension: Secondary | ICD-10-CM | POA: Diagnosis not present

## 2022-07-01 DIAGNOSIS — K219 Gastro-esophageal reflux disease without esophagitis: Secondary | ICD-10-CM | POA: Diagnosis not present

## 2022-07-01 DIAGNOSIS — Z1211 Encounter for screening for malignant neoplasm of colon: Secondary | ICD-10-CM | POA: Diagnosis not present

## 2022-07-02 ENCOUNTER — Other Ambulatory Visit: Payer: Self-pay | Admitting: Nurse Practitioner

## 2022-07-02 ENCOUNTER — Other Ambulatory Visit (HOSPITAL_BASED_OUTPATIENT_CLINIC_OR_DEPARTMENT_OTHER): Payer: Self-pay

## 2022-07-02 MED ORDER — MONTELUKAST SODIUM 10 MG PO TABS
10.0000 mg | ORAL_TABLET | Freq: Every day | ORAL | 1 refills | Status: DC
  Filled 2022-07-02: qty 90, 90d supply, fill #0

## 2022-07-08 ENCOUNTER — Other Ambulatory Visit (HOSPITAL_BASED_OUTPATIENT_CLINIC_OR_DEPARTMENT_OTHER): Payer: Self-pay

## 2022-07-08 MED ORDER — COMIRNATY 30 MCG/0.3ML IM SUSY
PREFILLED_SYRINGE | INTRAMUSCULAR | 0 refills | Status: DC
  Filled 2022-07-08: qty 0.3, 1d supply, fill #0

## 2022-07-10 ENCOUNTER — Other Ambulatory Visit (HOSPITAL_BASED_OUTPATIENT_CLINIC_OR_DEPARTMENT_OTHER): Payer: Self-pay

## 2022-07-16 ENCOUNTER — Other Ambulatory Visit (HOSPITAL_BASED_OUTPATIENT_CLINIC_OR_DEPARTMENT_OTHER): Payer: Self-pay

## 2022-07-17 ENCOUNTER — Other Ambulatory Visit (HOSPITAL_BASED_OUTPATIENT_CLINIC_OR_DEPARTMENT_OTHER): Payer: Self-pay

## 2022-07-21 ENCOUNTER — Other Ambulatory Visit (HOSPITAL_BASED_OUTPATIENT_CLINIC_OR_DEPARTMENT_OTHER): Payer: Self-pay

## 2022-07-22 ENCOUNTER — Other Ambulatory Visit (HOSPITAL_BASED_OUTPATIENT_CLINIC_OR_DEPARTMENT_OTHER): Payer: Self-pay

## 2022-08-01 ENCOUNTER — Other Ambulatory Visit (HOSPITAL_BASED_OUTPATIENT_CLINIC_OR_DEPARTMENT_OTHER): Payer: Self-pay

## 2022-08-01 MED ORDER — CLENPIQ 10-3.5-12 MG-GM -GM/175ML PO SOLN
ORAL | 0 refills | Status: DC
  Filled 2022-08-01: qty 350, 1d supply, fill #0

## 2022-08-05 ENCOUNTER — Other Ambulatory Visit (HOSPITAL_BASED_OUTPATIENT_CLINIC_OR_DEPARTMENT_OTHER): Payer: Self-pay

## 2022-08-05 ENCOUNTER — Other Ambulatory Visit: Payer: Self-pay

## 2022-08-07 ENCOUNTER — Encounter: Payer: Self-pay | Admitting: Nurse Practitioner

## 2022-08-13 DIAGNOSIS — Z1211 Encounter for screening for malignant neoplasm of colon: Secondary | ICD-10-CM | POA: Diagnosis not present

## 2022-08-13 DIAGNOSIS — K219 Gastro-esophageal reflux disease without esophagitis: Secondary | ICD-10-CM | POA: Diagnosis not present

## 2022-08-13 DIAGNOSIS — K319 Disease of stomach and duodenum, unspecified: Secondary | ICD-10-CM | POA: Diagnosis not present

## 2022-08-13 DIAGNOSIS — K562 Volvulus: Secondary | ICD-10-CM | POA: Diagnosis not present

## 2022-08-13 DIAGNOSIS — K3189 Other diseases of stomach and duodenum: Secondary | ICD-10-CM | POA: Diagnosis not present

## 2022-08-13 DIAGNOSIS — K2970 Gastritis, unspecified, without bleeding: Secondary | ICD-10-CM | POA: Diagnosis not present

## 2022-08-13 DIAGNOSIS — R12 Heartburn: Secondary | ICD-10-CM | POA: Diagnosis not present

## 2022-08-19 ENCOUNTER — Other Ambulatory Visit (HOSPITAL_BASED_OUTPATIENT_CLINIC_OR_DEPARTMENT_OTHER): Payer: Self-pay

## 2022-08-19 ENCOUNTER — Encounter: Payer: Self-pay | Admitting: Nurse Practitioner

## 2022-08-19 ENCOUNTER — Ambulatory Visit (INDEPENDENT_AMBULATORY_CARE_PROVIDER_SITE_OTHER): Payer: Medicare Other | Admitting: Nurse Practitioner

## 2022-08-19 VITALS — BP 126/82 | HR 63 | Wt 116.6 lb

## 2022-08-19 DIAGNOSIS — E785 Hyperlipidemia, unspecified: Secondary | ICD-10-CM

## 2022-08-19 DIAGNOSIS — I10 Essential (primary) hypertension: Secondary | ICD-10-CM | POA: Diagnosis not present

## 2022-08-19 DIAGNOSIS — E039 Hypothyroidism, unspecified: Secondary | ICD-10-CM | POA: Diagnosis not present

## 2022-08-19 DIAGNOSIS — K2100 Gastro-esophageal reflux disease with esophagitis, without bleeding: Secondary | ICD-10-CM

## 2022-08-19 DIAGNOSIS — R7303 Prediabetes: Secondary | ICD-10-CM

## 2022-08-19 DIAGNOSIS — Z23 Encounter for immunization: Secondary | ICD-10-CM | POA: Diagnosis not present

## 2022-08-19 DIAGNOSIS — M810 Age-related osteoporosis without current pathological fracture: Secondary | ICD-10-CM

## 2022-08-19 DIAGNOSIS — M79672 Pain in left foot: Secondary | ICD-10-CM

## 2022-08-19 DIAGNOSIS — F959 Tic disorder, unspecified: Secondary | ICD-10-CM

## 2022-08-19 DIAGNOSIS — L578 Other skin changes due to chronic exposure to nonionizing radiation: Secondary | ICD-10-CM

## 2022-08-19 DIAGNOSIS — J301 Allergic rhinitis due to pollen: Secondary | ICD-10-CM

## 2022-08-19 DIAGNOSIS — E538 Deficiency of other specified B group vitamins: Secondary | ICD-10-CM | POA: Diagnosis not present

## 2022-08-19 DIAGNOSIS — F5101 Primary insomnia: Secondary | ICD-10-CM

## 2022-08-19 DIAGNOSIS — M79671 Pain in right foot: Secondary | ICD-10-CM

## 2022-08-19 LAB — CBC WITH DIFFERENTIAL/PLATELET
Basophils Absolute: 0 10*3/uL (ref 0.0–0.2)
Basos: 1 %
EOS (ABSOLUTE): 0.1 10*3/uL (ref 0.0–0.4)
Hematocrit: 31.6 % — ABNORMAL LOW (ref 34.0–46.6)
Immature Granulocytes: 0 %
MCH: 29.8 pg (ref 26.6–33.0)
Monocytes: 7 %
Neutrophils: 62 %
RBC: 3.59 x10E6/uL — ABNORMAL LOW (ref 3.77–5.28)

## 2022-08-19 LAB — COMPREHENSIVE METABOLIC PANEL

## 2022-08-19 LAB — HEMOGLOBIN A1C: Est. average glucose Bld gHb Est-mCnc: 123 mg/dL

## 2022-08-19 LAB — VITAMIN B12

## 2022-08-19 MED ORDER — LISINOPRIL 10 MG PO TABS
10.0000 mg | ORAL_TABLET | Freq: Every day | ORAL | 3 refills | Status: DC
Start: 2022-08-19 — End: 2023-04-29
  Filled 2022-08-19 – 2022-11-12 (×4): qty 90, 90d supply, fill #0
  Filled 2023-01-21: qty 90, 90d supply, fill #1
  Filled 2023-04-18: qty 90, 90d supply, fill #2

## 2022-08-19 MED ORDER — LEVOTHYROXINE SODIUM 75 MCG PO TABS
75.0000 ug | ORAL_TABLET | Freq: Every morning | ORAL | 3 refills | Status: DC
Start: 2022-08-19 — End: 2023-04-29
  Filled 2022-08-19 – 2022-10-17 (×2): qty 90, 90d supply, fill #0
  Filled 2023-01-16: qty 90, 90d supply, fill #1
  Filled 2023-04-18: qty 90, 90d supply, fill #2

## 2022-08-19 MED ORDER — TRETINOIN 0.05 % EX CREA
TOPICAL_CREAM | CUTANEOUS | 1 refills | Status: DC
Start: 2022-08-19 — End: 2023-04-29
  Filled 2022-08-19: qty 40, fill #0
  Filled 2022-11-12: qty 40, 30d supply, fill #0
  Filled 2023-01-16: qty 40, 30d supply, fill #1

## 2022-08-19 MED ORDER — OMEPRAZOLE 20 MG PO CPDR
20.0000 mg | DELAYED_RELEASE_CAPSULE | Freq: Every day | ORAL | 3 refills | Status: DC
Start: 2022-08-19 — End: 2023-04-29
  Filled 2022-08-19 – 2022-08-26 (×2): qty 90, 90d supply, fill #0
  Filled 2022-11-12: qty 90, 90d supply, fill #1
  Filled 2023-02-10: qty 90, 90d supply, fill #2

## 2022-08-19 MED ORDER — TETANUS-DIPHTH-ACELL PERTUSSIS 5-2.5-18.5 LF-MCG/0.5 IM SUSP: 0.5000 mL | Freq: Once | INTRAMUSCULAR | 0 refills | Status: AC

## 2022-08-19 MED ORDER — MONTELUKAST SODIUM 10 MG PO TABS
10.0000 mg | ORAL_TABLET | Freq: Every day | ORAL | 1 refills | Status: DC
  Filled 2022-08-19 – 2022-11-12 (×2): qty 90, 90d supply, fill #0
  Filled 2023-02-10: qty 90, 90d supply, fill #1

## 2022-08-19 MED ORDER — ROSUVASTATIN CALCIUM 5 MG PO TABS
5.0000 mg | ORAL_TABLET | Freq: Every day | ORAL | 3 refills | Status: DC
Start: 2022-08-19 — End: 2023-04-29
  Filled 2022-08-19 – 2022-11-12 (×4): qty 90, 90d supply, fill #0
  Filled 2023-02-10: qty 90, 90d supply, fill #1
  Filled 2023-04-18: qty 90, 90d supply, fill #2

## 2022-08-19 MED ORDER — AMITRIPTYLINE HCL 150 MG PO TABS
150.0000 mg | ORAL_TABLET | Freq: Every day | ORAL | 3 refills | Status: DC
Start: 2022-08-19 — End: 2023-04-29
  Filled 2022-08-19 – 2023-01-16 (×2): qty 90, 90d supply, fill #0
  Filled 2023-04-18: qty 90, 90d supply, fill #1

## 2022-08-19 MED ORDER — FENOFIBRATE 54 MG PO TABS
54.0000 mg | ORAL_TABLET | Freq: Every day | ORAL | 3 refills | Status: DC
Start: 2022-08-19 — End: 2023-04-29
  Filled 2022-08-19: qty 90, 90d supply, fill #0
  Filled 2022-11-12: qty 90, 90d supply, fill #1
  Filled 2023-02-10: qty 90, 90d supply, fill #2

## 2022-08-19 NOTE — Patient Instructions (Addendum)
It was so good to see you today!   Check for [employer] Balance shoes on the website: [email or web address] and see if they have any extra wide width in the Fresh Foam styles. These have extra cushioning and they are discounted.   You may want to recheck with the foot doctor and see about having the orthotics looked at to see if they are the right fit for your foot.   I will let you know if your labs show any concerns.   I have sent in refills for all of your medications to the [facility]), if you have any concerns please let me know.   If you would like the referral to [facility], let me know. I can send this in for you at any time.

## 2022-08-19 NOTE — Progress Notes (Signed)
[name], DNP, AGNP-c [facility]  [address] [place], [place] [address] [phone]  ESTABLISHED PATIENT- Chronic Health and/or Follow-Up Visit  Blood pressure 126/82, pulse 63, weight 116 lb 9.6 oz (52.9 kg).    Bonnie Sanders is a 77 y.o. year old female presenting today for evaluation and management of chronic conditions.   Bonnie Sanders presents today for a routine follow-up visit,   She reports no new thyroid symptoms.   She confirms receiving the latest shingles vaccine, although there was a delay due to high demand at the pharmacy. Bonnie Sanders expresses frustration with the vaccination process, particularly the requirement for appointments, which she finds discriminatory towards the elderly population.  Bonnie Sanders discusses her recent struggles with foot pain, suspecting it might be due to arthritis. She has been using orthotics from the [employer] store, but feels they no longer provide adequate relief. Bonnie Sanders is considering revisiting a podiatrist for better management of her foot issues. She also talks about her experience with shoes, finding it difficult to get a comfortable fit and expressing dissatisfaction with the quality of shoes available.  Additionally, Bonnie Sanders mentions her family responsibilities, particularly taking care of her great-granddaughter, which she finds difficult at times.   Bonnie Sanders reports that her blood pressure has improved with regular walking, and her allergies are beginning to subside after a challenging season. She does not report any new issues with dizziness, chest pain, or shortness of breath. However, Jonathan expresses concern about her triglyceride levels, which have fluctuated despite medication, and discusses her dietary adjustments to manage this condition. She is in need of her tetanus   All ROS negative with exception of what is listed above.   PHYSICAL EXAM Physical Exam Vitals and nursing note reviewed.   Constitutional:      Appearance: Normal appearance. She is normal weight. She is not ill-appearing.  HENT:     Head: Normocephalic.  Eyes:     General: No scleral icterus.    Conjunctiva/sclera: Conjunctivae normal.  Neck:     Vascular: No carotid bruit.  Cardiovascular:     Rate and Rhythm: Normal rate and regular rhythm.     Pulses: Normal pulses.     Heart sounds: Normal heart sounds.  Pulmonary:     Effort: Pulmonary effort is normal.     Breath sounds: Normal breath sounds.  Abdominal:     General: Bowel sounds are normal. There is no distension.     Palpations: Abdomen is soft.     Tenderness: There is no abdominal tenderness. There is no guarding.  Musculoskeletal:     Cervical back: Neck supple. No tenderness.     Right lower leg: No edema.     Left lower leg: No edema.  Lymphadenopathy:     Cervical: No cervical adenopathy.  Skin:    General: Skin is warm and dry.     Capillary Refill: Capillary refill takes less than 2 seconds.  Neurological:     General: No focal deficit present.     Mental Status: She is alert and oriented to person, place, and time.     Sensory: No sensory deficit.     Motor: Tremor present. No weakness.  Psychiatric:        Mood and Affect: Mood normal.     PLAN Problem List Items Addressed This Visit     Hypothyroid    Patient reports no symptoms related to her thyroid condition, indicating stable thyroid function. Plan: - Continue current management and monitor  for any new symptoms. - Labs pending - Refills provided      Relevant Medications   levothyroxine (SYNTHROID) 75 MCG tablet   Other Relevant Orders   Hemoglobin A1c (Completed)   Vitamin B12 (Completed)   CBC with Differential/Platelet (Completed)   Comprehensive metabolic panel (Completed)   VITAMIN D 25 Hydroxy (Vit-D Deficiency, Fractures) (Completed)   TSH (Completed)   Lipid panel (Completed)   Hyperlipidemia with target low density lipoprotein (LDL)  cholesterol less than 130 mg/dL    Patient has a history of elevated triglycerides, which she has been managing with fenofibrate and dietary modifications. Plan: - Review recent triglyceride levels and consider adjusting fenofibrate dosage if levels are elevated. - Continue dietary recommendations for triglyceride management.      Relevant Medications   fenofibrate 54 MG tablet   lisinopril (ZESTRIL) 10 MG tablet   rosuvastatin (CRESTOR) 5 MG tablet   Other Relevant Orders   Hemoglobin A1c (Completed)   Vitamin B12 (Completed)   CBC with Differential/Platelet (Completed)   Comprehensive metabolic panel (Completed)   VITAMIN D 25 Hydroxy (Vit-D Deficiency, Fractures) (Completed)   TSH (Completed)   Lipid panel (Completed)   Hypertension - Primary     Patient's blood pressure is well-controlled with her current regimen. She noted a temporary increase when she reduced her physical activity but resolved with resumption of exercise. Plan: - Continue current antihypertensive medication without changes. - Encourage regular physical activity to maintain blood pressure control.      Relevant Medications   fenofibrate 54 MG tablet   lisinopril (ZESTRIL) 10 MG tablet   rosuvastatin (CRESTOR) 5 MG tablet   Other Relevant Orders   Hemoglobin A1c (Completed)   Vitamin B12 (Completed)   CBC with Differential/Platelet (Completed)   Comprehensive metabolic panel (Completed)   VITAMIN D 25 Hydroxy (Vit-D Deficiency, Fractures) (Completed)   TSH (Completed)   Lipid panel (Completed)   Osteoporosis    Labs pending today      Relevant Orders   Hemoglobin A1c (Completed)   Vitamin B12 (Completed)   CBC with Differential/Platelet (Completed)   Comprehensive metabolic panel (Completed)   VITAMIN D 25 Hydroxy (Vit-D Deficiency, Fractures) (Completed)   TSH (Completed)   Lipid panel (Completed)   Sun-damaged skin    Patient is satisfied with the effects of tretinoin on her skin and requires  a refill. Plan: - Refill prescription for tretinoin cream.       Relevant Medications   tretinoin (RETIN-A) 0.05 % cream   Other Relevant Orders   Hemoglobin A1c (Completed)   Vitamin B12 (Completed)   CBC with Differential/Platelet (Completed)   Comprehensive metabolic panel (Completed)   VITAMIN D 25 Hydroxy (Vit-D Deficiency, Fractures) (Completed)   TSH (Completed)   Lipid panel (Completed)   Allergic rhinitis    Patient reports seasonal allergies, which are currently improving. Plan: - Continue current allergy medications and monitor symptoms.      Gastro-esophageal reflux disease with esophagitis    Managed with prilosec with no concerns. Refills provided      Relevant Medications   omeprazole (PRILOSEC) 20 MG capsule   Other Relevant Orders   Hemoglobin A1c (Completed)   Vitamin B12 (Completed)   CBC with Differential/Platelet (Completed)   Comprehensive metabolic panel (Completed)   VITAMIN D 25 Hydroxy (Vit-D Deficiency, Fractures) (Completed)   TSH (Completed)   Lipid panel (Completed)   Tic disorder, unspecified    Chronic with no new symptoms or exacerbation.  Relevant Medications   amitriptyline (ELAVIL) 150 MG tablet   Other Relevant Orders   Hemoglobin A1c (Completed)   Vitamin B12 (Completed)   CBC with Differential/Platelet (Completed)   Comprehensive metabolic panel (Completed)   VITAMIN D 25 Hydroxy (Vit-D Deficiency, Fractures) (Completed)   TSH (Completed)   Lipid panel (Completed)   Vitamin B12 deficiency    Labs pending      Relevant Orders   Hemoglobin A1c (Completed)   Vitamin B12 (Completed)   CBC with Differential/Platelet (Completed)   Comprehensive metabolic panel (Completed)   VITAMIN D 25 Hydroxy (Vit-D Deficiency, Fractures) (Completed)   TSH (Completed)   Lipid panel (Completed)   Pain in both feet     Patient reports chronic foot pain exacerbated by arthritis and possibly inappropriate footwear. She has used  orthotics, which are no longer providing relief. Plan: - Recommend reevaluation by a podiatrist for potential adjustment of orthotics or alternative treatments. - Encourage exploration of different footwear options that may provide better support and comfort.      Primary insomnia    Malanie good control of sleep with amitriptyline at bedtime.  Plan: - Continue current regimen and good sleep hygiene practices.       Relevant Medications   amitriptyline (ELAVIL) 150 MG tablet   Other Relevant Orders   Hemoglobin A1c (Completed)   Vitamin B12 (Completed)   CBC with Differential/Platelet (Completed)   Comprehensive metabolic panel (Completed)   VITAMIN D 25 Hydroxy (Vit-D Deficiency, Fractures) (Completed)   TSH (Completed)   Lipid panel (Completed)   Other Visit Diagnoses     Need for tetanus booster       Pre-diabetes       Relevant Orders   Hemoglobin A1c (Completed)   Vitamin B12 (Completed)   CBC with Differential/Platelet (Completed)   Comprehensive metabolic panel (Completed)   VITAMIN D 25 Hydroxy (Vit-D Deficiency, Fractures) (Completed)   TSH (Completed)   Lipid panel (Completed)       Return in about 6 months (around 02/19/2023) for Med Management- HTN. HLD, Thyroid.  Time: 54 minutes, >50% spent counseling, care coordination, chart review, and documentation.   [name], DNP, AGNP-c 08/19/2022  8:11 AM

## 2022-08-20 ENCOUNTER — Other Ambulatory Visit (HOSPITAL_BASED_OUTPATIENT_CLINIC_OR_DEPARTMENT_OTHER): Payer: Self-pay

## 2022-08-20 LAB — COMPREHENSIVE METABOLIC PANEL
ALT: 17 IU/L (ref 0–32)
Albumin: 4.5 g/dL (ref 3.8–4.8)
BUN/Creatinine Ratio: 26 (ref 12–28)
BUN: 24 mg/dL (ref 8–27)
Bilirubin Total: 0.3 mg/dL (ref 0.0–1.2)
CO2: 22 mmol/L (ref 20–29)
Chloride: 104 mmol/L (ref 96–106)
Creatinine, Ser: 0.94 mg/dL (ref 0.57–1.00)
Globulin, Total: 2 g/dL (ref 1.5–4.5)
Glucose: 89 mg/dL (ref 70–99)
Potassium: 4.4 mmol/L (ref 3.5–5.2)
Total Protein: 6.5 g/dL (ref 6.0–8.5)
eGFR: 63 mL/min/{1.73_m2} (ref >59)

## 2022-08-20 LAB — LIPID PANEL
Chol/HDL Ratio: 3.2 ratio (ref 0.0–4.4)
Cholesterol, Total: 174 mg/dL (ref 100–199)
HDL: 54 mg/dL (ref >39)
LDL Chol Calc (NIH): 102 mg/dL — ABNORMAL HIGH (ref 0–99)
Triglycerides: 101 mg/dL (ref 0–149)
VLDL Cholesterol Cal: 18 mg/dL (ref 5–40)

## 2022-08-20 LAB — CBC WITH DIFFERENTIAL/PLATELET
Eos: 3 %
Hemoglobin: 10.7 g/dL — ABNORMAL LOW (ref 11.1–15.9)
Immature Grans (Abs): 0 10*3/uL (ref 0.0–0.1)
Lymphocytes Absolute: 1.3 10*3/uL (ref 0.7–3.1)
Lymphs: 27 %
MCHC: 33.9 g/dL (ref 31.5–35.7)
MCV: 88 fL (ref 79–97)
Monocytes Absolute: 0.4 10*3/uL (ref 0.1–0.9)
Neutrophils Absolute: 3 10*3/uL (ref 1.4–7.0)
Platelets: 225 10*3/uL (ref 150–450)
RDW: 12.4 % (ref 11.7–15.4)
WBC: 4.8 10*3/uL (ref 3.4–10.8)

## 2022-08-20 LAB — TSH: TSH: 3.31 u[IU]/mL (ref 0.450–4.500)

## 2022-08-20 LAB — HEMOGLOBIN A1C: Hgb A1c MFr Bld: 5.9 % — ABNORMAL HIGH (ref 4.8–5.6)

## 2022-08-20 LAB — VITAMIN D 25 HYDROXY (VIT D DEFICIENCY, FRACTURES): Vit D, 25-Hydroxy: 84.5 ng/mL (ref 30.0–100.0)

## 2022-08-20 MED ORDER — BOOSTRIX 5-2.5-18.5 LF-MCG/0.5 IM SUSY
0.5000 mL | PREFILLED_SYRINGE | INTRAMUSCULAR | 0 refills | Status: DC
  Filled 2022-08-20: qty 0.5, 1d supply, fill #0

## 2022-08-21 ENCOUNTER — Other Ambulatory Visit (HOSPITAL_BASED_OUTPATIENT_CLINIC_OR_DEPARTMENT_OTHER): Payer: Self-pay

## 2022-08-23 ENCOUNTER — Other Ambulatory Visit (HOSPITAL_BASED_OUTPATIENT_CLINIC_OR_DEPARTMENT_OTHER): Payer: Medicare Other

## 2022-08-23 ENCOUNTER — Emergency Department (HOSPITAL_BASED_OUTPATIENT_CLINIC_OR_DEPARTMENT_OTHER): Payer: Medicare Other

## 2022-08-23 ENCOUNTER — Encounter (HOSPITAL_BASED_OUTPATIENT_CLINIC_OR_DEPARTMENT_OTHER): Payer: Self-pay | Admitting: *Deleted

## 2022-08-23 ENCOUNTER — Emergency Department (HOSPITAL_BASED_OUTPATIENT_CLINIC_OR_DEPARTMENT_OTHER)
Admission: EM | Admit: 2022-08-23 | Discharge: 2022-08-23 | Disposition: A | Discharge status: Home / Self Care | Payer: Medicare Other | Attending: Emergency Medicine | Admitting: Emergency Medicine

## 2022-08-23 ENCOUNTER — Other Ambulatory Visit: Payer: Self-pay

## 2022-08-23 DIAGNOSIS — I10 Essential (primary) hypertension: Secondary | ICD-10-CM | POA: Insufficient documentation

## 2022-08-23 DIAGNOSIS — W01198A Fall on same level from slipping, tripping and stumbling with subsequent striking against other object, initial encounter: Secondary | ICD-10-CM | POA: Insufficient documentation

## 2022-08-23 DIAGNOSIS — Y92830 Public park as the place of occurrence of the external cause: Secondary | ICD-10-CM | POA: Diagnosis not present

## 2022-08-23 DIAGNOSIS — S0990XA Unspecified injury of head, initial encounter: Secondary | ICD-10-CM | POA: Insufficient documentation

## 2022-08-23 DIAGNOSIS — Q031 Atresia of foramina of Magendie and Luschka: Secondary | ICD-10-CM | POA: Insufficient documentation

## 2022-08-23 DIAGNOSIS — M542 Cervicalgia: Secondary | ICD-10-CM | POA: Insufficient documentation

## 2022-08-23 DIAGNOSIS — Z79899 Other long term (current) drug therapy: Secondary | ICD-10-CM | POA: Insufficient documentation

## 2022-08-23 DIAGNOSIS — Z043 Encounter for examination and observation following other accident: Secondary | ICD-10-CM | POA: Diagnosis not present

## 2022-08-23 DIAGNOSIS — S8992XA Unspecified injury of left lower leg, initial encounter: Secondary | ICD-10-CM | POA: Diagnosis not present

## 2022-08-23 DIAGNOSIS — G9389 Other specified disorders of brain: Secondary | ICD-10-CM | POA: Diagnosis not present

## 2022-08-23 DIAGNOSIS — Y93K1 Activity, walking an animal: Secondary | ICD-10-CM | POA: Insufficient documentation

## 2022-08-23 DIAGNOSIS — S80212A Abrasion, left knee, initial encounter: Secondary | ICD-10-CM | POA: Insufficient documentation

## 2022-08-23 DIAGNOSIS — W19XXXA Unspecified fall, initial encounter: Secondary | ICD-10-CM

## 2022-08-23 NOTE — ED Notes (Signed)
RN reviewed discharge instructions with pt. Pt verbalized understanding and had no further questions. VSS upon discharge.  

## 2022-08-23 NOTE — ED Provider Notes (Signed)
[place] EMERGENCY DEPARTMENT AT [facility] Provider Note   CSN: [id number] Arrival date & time: 08/23/22  1440     History  Chief Complaint  Patient presents with   Bonnie Sanders is a 77 y.o. female.  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  Patient says that she was walking her dogs earlier today at a park and was walking too fast and tripped and fell hitting her left side including her head and her knee.  Has been having some mild neck pain since then but unsure if this is her chronic pain.  Does have abrasions to her left knee but says that she has been walking does not have any significant pain at this time.  No LOC or nausea or vomiting.  Not on blood thinners.  Says that her tetanus shot was updated last Tuesday.       Home Medications Prior to Admission medications   Medication Sig Start Date End Date Taking? Authorizing Provider  amitriptyline (ELAVIL) 150 MG tablet Take 1 tablet (150 mg total) by mouth at bedtime. 08/19/22   [name], NP  B Complex Vitamins (VITAMIN B COMPLEX PO) Take by mouth.    [provider]  Cholecalciferol (VITAMIN D) 125 MCG (5000 UT) CAPS Take by mouth.    [provider]  fenofibrate 54 MG tablet Take 1 tablet (54 mg total) by mouth daily. 08/19/22   [name], NP  fluticasone (FLONASE) 50 MCG/ACT nasal spray Place 2 sprays into the nose daily. 05/23/22   [name], NP  levothyroxine (SYNTHROID) 75 MCG tablet Take 1 tablet (75 mcg total) by mouth every morning. 08/19/22   [name], NP  lisinopril (ZESTRIL) 10 MG tablet Take 1 tablet (10 mg total) by mouth daily. 08/19/22   [name], NP  montelukast (SINGULAIR) 10 MG tablet Take 1 tablet (10 mg total) by mouth at bedtime. 08/19/22   [name], NP  omeprazole (PRILOSEC) 20 MG capsule Take 1 capsule (20 mg total) by mouth  daily. 08/19/22   [name], NP  rosuvastatin (CRESTOR) 5 MG tablet Take 1 tablet (5 mg total) by mouth at bedtime. 08/19/22   [name], NP  RSV vaccine recomb adjuvanted (AREXVY) 120 MCG/0.5ML injection Inject into the muscle. Patient not taking: Reported on 04/02/2022 12/13/21     Tdap (BOOSTRIX) 5-2.5-18.5 LF-MCG/0.5 injection Inject 0.5 mLs into the muscle. 08/20/22   Judyann Munson, MD  tretinoin (RETIN-A) 0.05 % cream Apply a pea sized amount spreading evenly in a thin layer to face at bedtime. Avoid use close to the eyes and mouth. Wash hands thoroughly after applying. Avoid sunlight exposure. 08/19/22   [name], NP      Allergies    Codeine and Statins    Review of Systems   Review of Systems  Physical Exam Updated Vital Signs BP 125/70   Pulse 68   Temp 98.3 F (36.8 C) (Oral)   Resp 17   SpO2 100%  Physical Exam Constitutional:      General: She is not in acute distress.    Appearance: Normal appearance. She is not ill-appearing.  HENT:     Head: Normocephalic and atraumatic.     Right Ear: External ear normal.     Left Ear: External ear normal.     Mouth/Throat:  Mouth: Mucous membranes are moist.     Pharynx: Oropharynx is clear.  Eyes:     Extraocular Movements: Extraocular movements intact.     Conjunctiva/sclera: Conjunctivae normal.     Pupils: Pupils are equal, round, and reactive to light.  Neck:     Comments: No C-spine midline tenderness to palpation Cardiovascular:     Rate and Rhythm: Normal rate and regular rhythm.     Pulses: Normal pulses.     Heart sounds: Normal heart sounds.  Pulmonary:     Effort: Pulmonary effort is normal. No respiratory distress.     Breath sounds: Normal breath sounds.  Abdominal:     General: Abdomen is flat. Bowel sounds are normal.     Palpations: Abdomen is soft.     Tenderness: There is no abdominal tenderness. There is no guarding.  Musculoskeletal:        General: No deformity. Normal range of  motion.     Cervical back: No rigidity or tenderness.     Comments: No tenderness to palpation of midline thoracic or lumbar spine.  No step-offs palpated.  No tenderness to palpation of chest wall.  No bruising noted.  No tenderness to palpation of bilateral clavicles.  No tenderness to palpation, bruising, or deformities noted of bilateral shoulders, elbows, wrists, hips, knees, or ankles.  Abrasions to left knee  Neurological:     General: No focal deficit present.     Mental Status: She is alert and oriented to person, place, and time. Mental status is at baseline.     Cranial Nerves: No cranial nerve deficit.     Sensory: No sensory deficit.     Motor: No weakness.     ED Results / Procedures / Treatments   Labs (all labs ordered are listed, but only abnormal results are displayed) Labs Reviewed - No data to display  EKG None  Radiology CT Head Wo Contrast  Result Date: 08/23/2022 CLINICAL DATA:  Fall EXAM: CT HEAD WITHOUT CONTRAST CT CERVICAL SPINE WITHOUT CONTRAST TECHNIQUE: Multidetector CT imaging of the head and cervical spine was performed following the standard protocol without intravenous contrast. Multiplanar CT image reconstructions of the cervical spine were also generated. RADIATION DOSE REDUCTION: This exam was performed according to the departmental dose-optimization program which includes automated exposure control, adjustment of the mA and/or kV according to patient size and/or use of iterative reconstruction technique. COMPARISON:  CT brain 08/09/2009 FINDINGS: CT HEAD FINDINGS Brain: No acute territorial infarction, hemorrhage or intracranial mass. Mildly enlarged ventricles without significant cortical volume loss. Cyst or cystic CSF collection in the posterior fossa that communicates with fourth ventricle. Hypoplasia of cerebellar vermis and slightly enlarged fourth ventricle, consistent with Dandy-Walker variant, this is unchanged. Minimal white matter  hypodensity. Vascular: No hyperdense vessels.  Carotid vascular calcification Skull: No fracture.  Right frontal burr hole Sinuses/Orbits: No acute finding. Other: None CT CERVICAL SPINE FINDINGS Alignment: Straightening of the cervical spine. Trace anterolisthesis C4 on C5. Trace retrolisthesis C5 on C6 and C6 on C7. Facet alignment is within normal limits. Skull base and vertebrae: No acute fracture. No primary bone lesion or focal pathologic process. Soft tissues and spinal canal: No prevertebral fluid or swelling. No visible canal hematoma. Disc levels: Multilevel degenerative changes, worst at C5-C6 and C6-C7. Multilevel facet degenerative change with foraminal narrowing. Upper chest: Negative. Other: None IMPRESSION: 1. Negative for acute intracranial hemorrhage or mass. 2. Findings consistent with Dandy-Walker spectrum. Since the prior exam, slight interval enlargement of  the ventricles. No significant cortical volume loss, correlate for any history of normal pressure hydrocephalus 3. Straightening of the cervical spine with multilevel degenerative change. No acute osseous abnormality Electronically Signed   By: Jasmine Pang M.D.   On: 08/23/2022 15:49   CT Cervical Spine Wo Contrast  Result Date: 08/23/2022 CLINICAL DATA:  Fall EXAM: CT HEAD WITHOUT CONTRAST CT CERVICAL SPINE WITHOUT CONTRAST TECHNIQUE: Multidetector CT imaging of the head and cervical spine was performed following the standard protocol without intravenous contrast. Multiplanar CT image reconstructions of the cervical spine were also generated. RADIATION DOSE REDUCTION: This exam was performed according to the departmental dose-optimization program which includes automated exposure control, adjustment of the mA and/or kV according to patient size and/or use of iterative reconstruction technique. COMPARISON:  CT brain 08/09/2009 FINDINGS: CT HEAD FINDINGS Brain: No acute territorial infarction, hemorrhage or intracranial mass. Mildly  enlarged ventricles without significant cortical volume loss. Cyst or cystic CSF collection in the posterior fossa that communicates with fourth ventricle. Hypoplasia of cerebellar vermis and slightly enlarged fourth ventricle, consistent with Dandy-Walker variant, this is unchanged. Minimal white matter hypodensity. Vascular: No hyperdense vessels.  Carotid vascular calcification Skull: No fracture.  Right frontal burr hole Sinuses/Orbits: No acute finding. Other: None CT CERVICAL SPINE FINDINGS Alignment: Straightening of the cervical spine. Trace anterolisthesis C4 on C5. Trace retrolisthesis C5 on C6 and C6 on C7. Facet alignment is within normal limits. Skull base and vertebrae: No acute fracture. No primary bone lesion or focal pathologic process. Soft tissues and spinal canal: No prevertebral fluid or swelling. No visible canal hematoma. Disc levels: Multilevel degenerative changes, worst at C5-C6 and C6-C7. Multilevel facet degenerative change with foraminal narrowing. Upper chest: Negative. Other: None IMPRESSION: 1. Negative for acute intracranial hemorrhage or mass. 2. Findings consistent with Dandy-Walker spectrum. Since the prior exam, slight interval enlargement of the ventricles. No significant cortical volume loss, correlate for any history of normal pressure hydrocephalus 3. Straightening of the cervical spine with multilevel degenerative change. No acute osseous abnormality Electronically Signed   By: Jasmine Pang M.D.   On: 08/23/2022 15:49    Procedures Procedures    Medications Ordered in ED Medications - No data to display  ED Course/ Medical Decision Making/ A&P                             Medical Decision Making Amount and/or Complexity of Data Reviewed Radiology: ordered.   Bonnie Sanders is a 77 y.o. 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  Initial Ddx:  TBI, c-spine injury, mechanical fall, syncope   MDM:  Concern about TBI given the patient's fall and the fact that they elderly will obtain a head CT at this time.  With their age and neck pain we will also obtain a CT of the C-spine as well.  Appears that this was a result of mechanical fall and did not have any preceding symptoms that would suggest syncope.  Does have him some abrasions to her knee but has been walking on it without difficulty and does not have any significant pain.  Do not feel that imaging is warranted at this time.  Tetanus shot is already updated.  Plan:  CT head CT C-spine  ED Summary/Re-evaluation:  No acute injuries were found on the patient's imaging.  Will have them follow-up  with thier primary doctor in several days regarding their symptoms.  Patient was informed of the incidental findings of the Dandy-Walker malformation which she was unaware of noted been previously seen as well as the enlarged ventricles which could be reflective of normal pressure hydrocephalus.  Patient is not having any incontinence, confusion, or difficulty walking.  Will have her follow-up with her primary doctor and neurology for this.  This patient presents to the ED for concern of complaints listed in HPI, this involves an extensive number of treatment options, and is a complaint that carries with it a high risk of complications and morbidity. Disposition including potential need for admission considered.   Dispo: DC Home. Return precautions discussed including, but not limited to, those listed in the AVS. Allowed pt time to ask questions which were answered fully prior to dc.  Additional history obtained from spouse Records reviewed Outpatient Clinic Notes I independently reviewed the following imaging with scope of interpretation limited to determining acute life threatening conditions related to emergency care: CT Head and agree with the  radiologist interpretation with the following exceptions: none I personally reviewed and interpreted cardiac monitoring: normal sinus rhythm  I have reviewed the patients home medications and made adjustments as needed Social Determinants of health:  Elderly         Final Clinical Impression(s) / ED Diagnoses Final diagnoses:  Fall, initial encounter  Minor head injury, initial encounter  [name] malformation [facility])    Rx / DC Orders ED Discharge Orders     None         Rondel Baton, MD 08/24/22 1228

## 2022-08-23 NOTE — ED Triage Notes (Signed)
Pt states that she was walking her dogs when she tripped and fell. She fell to her side on pavement and struck the left side of her head on pavement.  No LOC.  Pt is not on any blood thinners.  No neck pain. Pt is alert and oriented

## 2022-08-23 NOTE — Discharge Instructions (Signed)
You were seen for your fall in the emergency department.   Your head CT showed a dandy walker malformation which is congenital. It also showed enlarged ventricles of the brain which sometimes can be reflective of a condition called normal pressure hydrocephalus.  Check your MyChart online for the results of any tests that had not resulted by the time you left the emergency department.   Follow-up with your primary doctor in 2-3 days regarding your visit.  Follow-up with a neurologist about your head CT findings as well.   Return immediately to the emergency department if you experience any of the following: severe headache, falls/dizziness, or any other concerning symptoms.    Thank you for visiting our Emergency Department. It was a pleasure taking care of you today.

## 2022-08-26 ENCOUNTER — Other Ambulatory Visit (HOSPITAL_BASED_OUTPATIENT_CLINIC_OR_DEPARTMENT_OTHER): Payer: Self-pay

## 2022-08-27 ENCOUNTER — Telehealth: Payer: Self-pay

## 2022-08-27 NOTE — Telephone Encounter (Signed)
Transition Care Management Follow-up Telephone Call Date of discharge and from where: 08/23/2022 [facility] How have you been since you were released from the hospital? Patient is feeling better Any questions or concerns? No  Items Reviewed: Did the pt receive and understand the discharge instructions provided? Yes  Medications obtained and verified? Yes  Other? No  Any new allergies since your discharge? No  Dietary orders reviewed? Yes Do you have support at home? Yes   Follow up appointments reviewed:  PCP Hospital f/u appt confirmed? Yes  Scheduled to see Enid Skeens NP on 08/30/2022 @ [facility] Medicine. Specialist Hospital f/u appt confirmed? No  Scheduled to see  on  @ . Are transportation arrangements needed? No  If their condition worsens, is the pt aware to call PCP or go to the Emergency Dept.? Yes Was the patient provided with contact information for the PCP's office or ED? Yes Was to pt encouraged to call back with questions or concerns? Yes  [facility]  [facility] Population Health Community Resource Care Guide   [email or web address]  ?? [phone]   Website: [email or web address]  [email or web address]

## 2022-08-30 ENCOUNTER — Ambulatory Visit (INDEPENDENT_AMBULATORY_CARE_PROVIDER_SITE_OTHER): Payer: Medicare Other | Admitting: Nurse Practitioner

## 2022-08-30 ENCOUNTER — Encounter: Payer: Self-pay | Admitting: Nurse Practitioner

## 2022-08-30 VITALS — BP 110/62 | HR 57 | Temp 98.6°F | Resp 18 | Wt 115.8 lb

## 2022-08-30 DIAGNOSIS — M5030 Other cervical disc degeneration, unspecified cervical region: Secondary | ICD-10-CM

## 2022-08-30 DIAGNOSIS — I6523 Occlusion and stenosis of bilateral carotid arteries: Secondary | ICD-10-CM

## 2022-08-30 DIAGNOSIS — Q031 Atresia of foramina of Magendie and Luschka: Secondary | ICD-10-CM

## 2022-08-30 DIAGNOSIS — I10 Essential (primary) hypertension: Secondary | ICD-10-CM

## 2022-08-30 DIAGNOSIS — G910 Communicating hydrocephalus: Secondary | ICD-10-CM | POA: Insufficient documentation

## 2022-08-30 NOTE — Progress Notes (Signed)
  [name], DNP, AGNP-c [facility] [address] [place], [place] [address] [phone]  Subjective:   Bonnie Sanders is a 77 y.o. female presents to day for Hospital Follow-Up.  [name] encountered a fall on 08/23/2022 and subsequently was seen in the emergency room for a head CT.  On imaging, a cyst or cystic CSF collection consistent with [name] Syndrome was identified.   CT report read:  "Brain: No acute territorial infarction, hemorrhage or intracranial mass. Mildly enlarged ventricles without significant cortical volume loss. Cyst or cystic CSF collection in the posterior fossa that communicates with fourth ventricle. Hypoplasia of cerebellar vermis and slightly enlarged fourth ventricle, consistent with Dandy-Walker variant, this is unchanged. Minimal white matter hypodensity."  "IMPRESSION: 1. Negative for acute intracranial hemorrhage or mass. 2. Findings consistent with Dandy-Walker spectrum. Since the prior exam, slight interval enlargement of the ventricles. No significant cortical volume loss, correlate for any history of normal pressure hydrocephalus 3. Straightening of the cervical spine with multilevel degenerative change. No acute osseous abnormality"  [name] expresses concern that despite previous imaging, she was never informed of the possibility of Dandy Walker Syndrome. Review of earlier scans show that this has been mentioned as far as 2008, but not discussed or explained to the patient. Fortunately, it appears there are no changes.   [name] mentions that her condition is considered mild and has not significantly impacted her daily life, except for occasional tremors.  PMH, Medications, and Allergies reviewed and updated in chart as appropriate.   ROS negative except for what is listed in HPI. Objective:  There were no vitals taken for this visit. Physical Exam        Assessment & Plan:   Problem List Items Addressed  This Visit   None     [name], DNP, AGNP-c 08/30/2022  7:43 AM    History, Medications, Surgery, SDOH, and Family History reviewed and updated as appropriate.

## 2022-09-04 DIAGNOSIS — F5101 Primary insomnia: Secondary | ICD-10-CM | POA: Insufficient documentation

## 2022-09-04 NOTE — Assessment & Plan Note (Signed)
Patient reports seasonal allergies, which are currently improving. Plan: - Continue current allergy medications and monitor symptoms.

## 2022-09-04 NOTE — Assessment & Plan Note (Addendum)
Patient reports no symptoms related to her thyroid condition, indicating stable thyroid function. Plan: - Continue current management and monitor for any new symptoms. - Labs pending - Refills provided

## 2022-09-04 NOTE — Assessment & Plan Note (Signed)
Labs pending today

## 2022-09-04 NOTE — Assessment & Plan Note (Signed)
Chronic with no new symptoms or exacerbation.

## 2022-09-04 NOTE — Assessment & Plan Note (Signed)
Bonnie Sanders good control of sleep with amitriptyline at bedtime.  Plan: - Continue current regimen and good sleep hygiene practices.

## 2022-09-04 NOTE — Assessment & Plan Note (Signed)
Patient is satisfied with the effects of tretinoin on her skin and requires a refill. Plan: - Refill prescription for tretinoin cream.

## 2022-09-04 NOTE — Assessment & Plan Note (Signed)
Patient's blood pressure is well-controlled with her current regimen. She noted a temporary increase when she reduced her physical activity but resolved with resumption of exercise. Plan: - Continue current antihypertensive medication without changes. - Encourage regular physical activity to maintain blood pressure control.

## 2022-09-04 NOTE — Assessment & Plan Note (Signed)
Managed with prilosec with no concerns. Refills provided

## 2022-09-04 NOTE — Assessment & Plan Note (Signed)
Patient reports chronic foot pain exacerbated by arthritis and possibly inappropriate footwear. She has used orthotics, which are no longer providing relief. Plan: - Recommend reevaluation by a podiatrist for potential adjustment of orthotics or alternative treatments. - Encourage exploration of different footwear options that may provide better support and comfort.

## 2022-09-04 NOTE — Assessment & Plan Note (Signed)
Labs pending.  

## 2022-09-04 NOTE — Assessment & Plan Note (Signed)
Patient has a history of elevated triglycerides, which she has been managing with fenofibrate and dietary modifications. Plan: - Review recent triglyceride levels and consider adjusting fenofibrate dosage if levels are elevated. - Continue dietary recommendations for triglyceride management.

## 2022-09-16 DIAGNOSIS — K219 Gastro-esophageal reflux disease without esophagitis: Secondary | ICD-10-CM | POA: Diagnosis not present

## 2022-09-17 ENCOUNTER — Ambulatory Visit (INDEPENDENT_AMBULATORY_CARE_PROVIDER_SITE_OTHER): Payer: Medicare Other

## 2022-09-17 VITALS — Ht 59.0 in | Wt 113.0 lb

## 2022-09-17 DIAGNOSIS — Z0000 Encounter for general adult medical examination without abnormal findings: Secondary | ICD-10-CM | POA: Diagnosis not present

## 2022-09-17 NOTE — Patient Instructions (Signed)
Bonnie Sanders , Thank you for taking time to come for your Medicare Wellness Visit. I appreciate your ongoing commitment to your health goals. Please review the following plan we discussed and let me know if I can assist you in the future.   These are the goals we discussed:  Goals      Patient Stated     Go out to the movies more this year. I have only been twice since the pandemic.      Patient Stated     09/17/2022, wants to cut carbs out of diet        This is a list of the screening recommended for you and due dates:  Health Maintenance  Topic Date Due   Zoster (Shingles) Vaccine (1 of 2) Never done   Pneumonia Vaccine (2 of 2 - PCV) 06/02/2013   Flu Shot  11/07/2022   Medicare Annual Wellness Visit  09/17/2023   DTaP/Tdap/Td vaccine (4 - Td or Tdap) 08/19/2032   DEXA scan (bone density measurement)  Completed   COVID-19 Vaccine  Completed   HPV Vaccine  Aged Out   Colon Cancer Screening  Discontinued   Hepatitis C Screening  Discontinued    Advanced directives: Advance directive discussed with you today.   Conditions/risks identified: none  Next appointment: Follow up in one year for your annual wellness visit    Preventive Care 65 Years and Older, Female Preventive care refers to lifestyle choices and visits with your health care provider that can promote health and wellness. What does preventive care include? A yearly physical exam. This is also called an annual well check. Dental exams once or twice a year. Routine eye exams. Ask your health care provider how often you should have your eyes checked. Personal lifestyle choices, including: Daily care of your teeth and gums. Regular physical activity. Eating a healthy diet. Avoiding tobacco and drug use. Limiting alcohol use. Practicing safe sex. Taking low-dose aspirin every day. Taking vitamin and mineral supplements as recommended by your health care provider. What happens during an annual well check? The  services and screenings done by your health care provider during your annual well check will depend on your age, overall health, lifestyle risk factors, and family history of disease. Counseling  Your health care provider may ask you questions about your: Alcohol use. Tobacco use. Drug use. Emotional well-being. Home and relationship well-being. Sexual activity. Eating habits. History of falls. Memory and ability to understand (cognition). Work and work Astronomer. Reproductive health. Screening  You may have the following tests or measurements: Height, weight, and BMI. Blood pressure. Lipid and cholesterol levels. These may be checked every 5 years, or more frequently if you are over 22 years old. Skin check. Lung cancer screening. You may have this screening every year starting at age 75 if you have a 30-pack-year history of smoking and currently smoke or have quit within the past 15 years. Fecal occult blood test (FOBT) of the stool. You may have this test every year starting at age 27. Flexible sigmoidoscopy or colonoscopy. You may have a sigmoidoscopy every 5 years or a colonoscopy every 10 years starting at age 77. Hepatitis C blood test. Hepatitis B blood test. Sexually transmitted disease (STD) testing. Diabetes screening. This is done by checking your blood sugar (glucose) after you have not eaten for a while (fasting). You may have this done every 1-3 years. Bone density scan. This is done to screen for osteoporosis. You may have this done  starting at age 27. Mammogram. This may be done every 1-2 years. Talk to your health care provider about how often you should have regular mammograms. Talk with your health care provider about your test results, treatment options, and if necessary, the need for more tests. Vaccines  Your health care provider may recommend certain vaccines, such as: Influenza vaccine. This is recommended every year. Tetanus, diphtheria, and acellular  pertussis (Tdap, Td) vaccine. You may need a Td booster every 10 years. Zoster vaccine. You may need this after age 57. Pneumococcal 13-valent conjugate (PCV13) vaccine. One dose is recommended after age 37. Pneumococcal polysaccharide (PPSV23) vaccine. One dose is recommended after age 16. Talk to your health care provider about which screenings and vaccines you need and how often you need them. This information is not intended to replace advice given to you by your health care provider. Make sure you discuss any questions you have with your health care provider. Document Released: 04/21/2015 Document Revised: 12/13/2015 Document Reviewed: 01/24/2015 Elsevier Interactive Patient Education  2017 [place] Prevention in the Home Falls can cause injuries. They can happen to people of all ages. There are many things you can do to make your home safe and to help prevent falls. What can I do on the outside of my home? Regularly fix the edges of walkways and driveways and fix any cracks. Remove anything that might make you trip as you walk through a door, such as a raised step or threshold. Trim any bushes or trees on the path to your home. Use bright outdoor lighting. Clear any walking paths of anything that might make someone trip, such as rocks or tools. Regularly check to see if handrails are loose or broken. Make sure that both sides of any steps have handrails. Any raised decks and porches should have guardrails on the edges. Have any leaves, snow, or ice cleared regularly. Use sand or salt on walking paths during winter. Clean up any spills in your garage right away. This includes oil or grease spills. What can I do in the bathroom? Use night lights. Install grab bars by the toilet and in the tub and shower. Do not use towel bars as grab bars. Use non-skid mats or decals in the tub or shower. If you need to sit down in the shower, use a plastic, non-slip stool. Keep the floor  dry. Clean up any water that spills on the floor as soon as it happens. Remove soap buildup in the tub or shower regularly. Attach bath mats securely with double-sided non-slip rug tape. Do not have throw rugs and other things on the floor that can make you trip. What can I do in the bedroom? Use night lights. Make sure that you have a light by your bed that is easy to reach. Do not use any sheets or blankets that are too big for your bed. They should not hang down onto the floor. Have a firm chair that has side arms. You can use this for support while you get dressed. Do not have throw rugs and other things on the floor that can make you trip. What can I do in the kitchen? Clean up any spills right away. Avoid walking on wet floors. Keep items that you use a lot in easy-to-reach places. If you need to reach something above you, use a strong step stool that has a grab bar. Keep electrical cords out of the way. Do not use floor polish or wax that  makes floors slippery. If you must use wax, use non-skid floor wax. Do not have throw rugs and other things on the floor that can make you trip. What can I do with my stairs? Do not leave any items on the stairs. Make sure that there are handrails on both sides of the stairs and use them. Fix handrails that are broken or loose. Make sure that handrails are as long as the stairways. Check any carpeting to make sure that it is firmly attached to the stairs. Fix any carpet that is loose or worn. Avoid having throw rugs at the top or bottom of the stairs. If you do have throw rugs, attach them to the floor with carpet tape. Make sure that you have a light switch at the top of the stairs and the bottom of the stairs. If you do not have them, ask someone to add them for you. What else can I do to help prevent falls? Wear shoes that: Do not have high heels. Have rubber bottoms. Are comfortable and fit you well. Are closed at the toe. Do not wear  sandals. If you use a stepladder: Make sure that it is fully opened. Do not climb a closed stepladder. Make sure that both sides of the stepladder are locked into place. Ask someone to hold it for you, if possible. Clearly mark and make sure that you can see: Any grab bars or handrails. First and last steps. Where the edge of each step is. Use tools that help you move around (mobility aids) if they are needed. These include: Canes. Walkers. Scooters. Crutches. Turn on the lights when you go into a dark area. Replace any light bulbs as soon as they burn out. Set up your furniture so you have a clear path. Avoid moving your furniture around. If any of your floors are uneven, fix them. If there are any pets around you, be aware of where they are. Review your medicines with your doctor. Some medicines can make you feel dizzy. This can increase your chance of falling. Ask your doctor what other things that you can do to help prevent falls. This information is not intended to replace advice given to you by your health care provider. Make sure you discuss any questions you have with your health care provider. Document Released: 01/19/2009 Document Revised: 08/31/2015 Document Reviewed: 04/29/2014 Elsevier Interactive Patient Education  2017 [employer].

## 2022-09-17 NOTE — Progress Notes (Signed)
I connected with  Bonnie Sanders on 09/17/22 by a audio enabled telemedicine application and verified that I am speaking with the correct person using two identifiers.  Patient Location: Home  Provider Location: Office/Clinic  I discussed the limitations of evaluation and management by telemedicine. The patient expressed understanding and agreed to proceed. Subjective:   Bonnie Sanders is a 77 y.o. female who presents for Medicare Annual (Subsequent) preventive examination.  Review of Systems     Cardiac Risk Factors include: advanced age (>41men, >70 women);dyslipidemia;hypertension     Objective:    Today's Vitals   09/17/22 1526  Weight: 113 lb (51.3 kg)  Height: 4\' 11"  (1.499 m)   Body mass index is 22.82 kg/m.     09/17/2022    3:32 PM 08/23/2022    3:06 PM 08/22/2021    8:27 PM 06/25/2021    1:50 PM  Advanced Directives  Does Patient Have a Medical Advance Directive? No No No No  Does patient want to make changes to medical advance directive?   Yes (MAU/Ambulatory/Procedural Areas - Information given)   Would patient like information on creating a medical advance directive?   Yes (MAU/Ambulatory/Procedural Areas - Information given) No - Patient declined    Current Medications (verified) Outpatient Encounter Medications as of 09/17/2022  Medication Sig   amitriptyline (ELAVIL) 150 MG tablet Take 1 tablet (150 mg total) by mouth at bedtime.   B Complex Vitamins (VITAMIN B COMPLEX PO) Take by mouth.   Cholecalciferol (VITAMIN D) 125 MCG (5000 UT) CAPS Take by mouth.   fenofibrate 54 MG tablet Take 1 tablet (54 mg total) by mouth daily.   fluticasone (FLONASE) 50 MCG/ACT nasal spray Place 2 sprays into the nose daily.   levothyroxine (SYNTHROID) 75 MCG tablet Take 1 tablet (75 mcg total) by mouth every morning.   lisinopril (ZESTRIL) 10 MG tablet Take 1 tablet (10 mg total) by mouth daily.   montelukast (SINGULAIR) 10 MG tablet Take 1 tablet (10 mg total) by mouth at  bedtime.   omeprazole (PRILOSEC) 20 MG capsule Take 1 capsule (20 mg total) by mouth daily.   rosuvastatin (CRESTOR) 5 MG tablet Take 1 tablet (5 mg total) by mouth at bedtime.   tretinoin (RETIN-A) 0.05 % cream Apply a pea sized amount spreading evenly in a thin layer to face at bedtime. Avoid use close to the eyes and mouth. Wash hands thoroughly after applying. Avoid sunlight exposure.   RSV vaccine recomb adjuvanted (AREXVY) 120 MCG/0.5ML injection Inject into the muscle. (Patient not taking: Reported on 09/17/2022)   No facility-administered encounter medications on file as of 09/17/2022.    Allergies (verified) Codeine and Statins   History: Past Medical History:  Diagnosis Date   Allergic rhinitis    Allergic rhinitis due to pollen 11/30/2010   Depression    Dyslipidemia    Fecal incontinence 01/28/2017   Formatting of this note might be different from the original. Normal rectal exam.  Episodes are likely due to the nighttime amitriptyline   GERD (gastroesophageal reflux disease)    Heme positive stool    Hip pain, left 11/17/2013   Hyperlipidemia    Hypertension    Hypothyroid    Muscle cramping 10/10/2021   Osteoporosis    Other screening mammogram    Pain in both lower extremities 10/10/2021   Primary osteoarthritis of left knee 07/25/2015   Past Surgical History:  Procedure Laterality Date   ABDOMINAL HYSTERECTOMY     COLONOSCOPY  2009  FOOT SURGERY     TOTAL KNEE ARTHROPLASTY     right foot   Family History  Problem Relation Age of Onset   Hypertension Mother    Arthritis Mother    Hypertension Father    Heart disease Father    Hyperlipidemia Father    Arthritis Father    Hypertension Maternal Grandmother    Stroke Maternal Grandmother    Heart disease Maternal Grandmother    Arthritis Maternal Grandmother    Hypertension Maternal Grandfather    Arthritis Maternal Grandfather    Hypertension Paternal Grandmother    Arthritis Paternal Grandmother     Hypertension Paternal Grandfather    Arthritis Paternal Grandfather    Social History   Socioeconomic History   Marital status: Married    Spouse name: Not on file   Number of children: Not on file   Years of education: Not on file   Highest education level: Not on file  Occupational History   Not on file  Tobacco Use   Smoking status: Never   Smokeless tobacco: Never  Vaping Use   Vaping Use: Never used  Substance and Sexual Activity   Alcohol use: No   Drug use: No   Sexual activity: Not Currently  Other Topics Concern   Not on file  Social History Narrative   Not on file   Social Determinants of Health   Financial Resource Strain: Low Risk  (09/17/2022)   Overall Financial Resource Strain (CARDIA)    Difficulty of Paying Living Expenses: Not hard at all  Food Insecurity: No Food Insecurity (09/17/2022)   Hunger Vital Sign    Worried About Running Out of Food in the Last Year: Never true    Ran Out of Food in the Last Year: Never true  Transportation Needs: No Transportation Needs (09/17/2022)   PRAPARE - Administrator, Civil Service (Medical): No    Lack of Transportation (Non-Medical): No  Physical Activity: Sufficiently Active (09/17/2022)   Exercise Vital Sign    Days of Exercise per Week: 5 days    Minutes of Exercise per Session: 50 min  Stress: No Stress Concern Present (09/17/2022)   [employer] of Occupational Health - Occupational Stress Questionnaire    Feeling of Stress : Not at all  Social Connections: Socially Integrated (08/22/2021)   Social Connection and Isolation Panel [NHANES]    Frequency of Communication with Friends and Family: More than three times a week    Frequency of Social Gatherings with Friends and Family: More than three times a week    Attends Religious Services: More than 4 times per year    Active Member of [employer] or Organizations: Yes    Attends Engineer, structural: More than 4 times per year    Marital  Status: Married    Tobacco Counseling Counseling given: Not Answered   Clinical Intake:  Pre-visit preparation completed: Yes  Pain : No/denies pain     Nutritional Status: BMI of 19-24  Normal Nutritional Risks: None Diabetes: No  How often do you need to have someone help you when you read instructions, pamphlets, or other written materials from your doctor or pharmacy?: 1 - Never  Diabetic? no  Interpreter Needed?: No  Information entered by :: NAllen LPN   Activities of Daily Living    09/17/2022    3:35 PM 02/18/2022    8:35 AM  In your present state of health, do you have any difficulty performing the following  activities:  Hearing? 1 1  Comment wears hearing aids   Vision? 0 0  Difficulty concentrating or making decisions? 0 0  Walking or climbing stairs? 0 0  Dressing or bathing? 0 0  Doing errands, shopping? 0 0  Preparing Food and eating ? N   Using the Toilet? N   In the past six months, have you accidently leaked urine? Y   Do you have problems with loss of bowel control? N   Managing your Medications? N   Managing your Finances? N   Housekeeping or managing your Housekeeping? N     Patient Care Team: Early, Sung Amabile, NP as PCP - General (Nurse Practitioner)  Indicate any recent Medical Services you may have received from other than [facility] providers in the past year (date may be approximate).     Assessment:   This is a routine wellness examination for Wilfred.  Hearing/Vision screen Vision Screening - Comments:: Regular eye exams, [facility]  Dietary issues and exercise activities discussed: Current Exercise Habits: Home exercise routine, Type of exercise: walking, Time (Minutes): 50, Frequency (Times/Week): 5, Weekly Exercise (Minutes/Week): 250   Goals Addressed             This Visit's Progress    Patient Stated       09/17/2022, wants to cut carbs out of diet       Depression Screen    09/17/2022    3:35 PM 08/19/2022     8:08 AM 02/18/2022    8:34 AM 10/16/2021   10:56 AM 10/10/2021    8:28 AM 07/17/2021    2:45 PM 06/06/2021    8:06 AM  PHQ 2/9 Scores  PHQ - 2 Score 0 0 0 0 0 0 0  PHQ- 9 Score   1 0     Exception Documentation    Medical reason       Fall Risk    09/17/2022    3:33 PM 08/19/2022    8:07 AM 02/18/2022    8:37 AM 11/14/2021    8:36 AM 10/16/2021   10:56 AM  Fall Risk   Falls in the past year? 1 0 1 0 1  Comment tripped      Number falls in past yr: 0 0 0 0 1  Injury with Fall? 0 0 0 0 1  Risk for fall due to : Medication side effect No Fall Risks   History of fall(s)  Follow up Falls prevention discussed;Education provided;Falls evaluation completed Falls evaluation completed  Falls evaluation completed;Education provided Falls evaluation completed    FALL RISK PREVENTION PERTAINING TO THE HOME:  Any stairs in or around the home? Yes  If so, are there any without handrails? No  Home free of loose throw rugs in walkways, pet beds, electrical cords, etc? Yes  Adequate lighting in your home to reduce risk of falls? Yes   ASSISTIVE DEVICES UTILIZED TO PREVENT FALLS:  Life alert? No  Use of a cane, walker or w/c? No  Grab bars in the bathroom? Yes  Shower chair or bench in shower? No  Elevated toilet seat or a handicapped toilet? Yes   TIMED UP AND GO:  Was the test performed? No .      Cognitive Function:        09/17/2022    3:37 PM 08/22/2021    8:28 PM  6CIT Screen  What Year? 0 points 0 points  What month? 0 points 0 points  What time? 0  points 0 points  Count back from 20 0 points 0 points  Months in reverse 0 points 0 points  Repeat phrase 0 points 0 points  Total Score 0 points 0 points    Immunizations Immunization History  Administered Date(s) Administered   COVID-19, mRNA, vaccine(Comirnaty)12 years and older 07/08/2022   Influenza Whole 12/03/2010   Influenza, High Dose Seasonal PF 12/26/2012, 12/12/2016   Influenza, Quadrivalent, Recombinant, Inj, Pf  12/07/2021   Influenza-Unspecified 12/07/2012, 12/07/2017, 12/07/2020   MMR 04/19/2020   PFIZER(Purple Top)SARS-COV-2 Vaccination 06/10/2019, 07/07/2019   Pfizer Covid-19 Vaccine Bivalent Booster 44yrs & up 09/05/2021   Pneumococcal Polysaccharide-23 06/02/2012   Respiratory Syncytial Virus Vaccine,Recomb Aduvanted(Arexvy) 12/13/2021   Tdap 12/03/2010, 04/09/2011, 08/20/2022   Zoster, Live 04/08/2008, 03/06/2010    TDAP status: Up to date  Flu Vaccine status: Up to date  Pneumococcal vaccine status: Up to date  Covid-19 vaccine status: Completed vaccines  Qualifies for Shingles Vaccine? Yes   Zostavax completed Yes   Shingrix Completed?: Yes  Screening Tests Health Maintenance  Topic Date Due   Zoster Vaccines- Shingrix (1 of 2) Never done   Pneumonia Vaccine 33+ Years old (2 of 2 - PCV) 06/02/2013   Medicare Annual Wellness (AWV)  08/17/2022   INFLUENZA VACCINE  11/07/2022   DTaP/Tdap/Td (4 - Td or Tdap) 08/19/2032   DEXA SCAN  Completed   COVID-19 Vaccine  Completed   HPV VACCINES  Aged Out   Colonoscopy  Discontinued   Hepatitis C Screening  Discontinued    Health Maintenance  Health Maintenance Due  Topic Date Due   Zoster Vaccines- Shingrix (1 of 2) Never done   Pneumonia Vaccine 67+ Years old (2 of 2 - PCV) 06/02/2013   Medicare Annual Wellness (AWV)  08/17/2022    Colorectal cancer screening: No longer required.   Mammogram status: Completed 04/09/2022. Repeat every year  Bone Density status: Completed 06/06/2021.   Lung Cancer Screening: (Low Dose CT Chest recommended if Age 65-80 years, 30 pack-year currently smoking OR have quit w/in 15years.) does not qualify.   Lung Cancer Screening Referral: no  Additional Screening:  Hepatitis C Screening: does not qualify;  Vision Screening: Recommended annual ophthalmology exams for early detection of glaucoma and other disorders of the eye. Is the patient up to date with their annual eye exam?  Yes  Who is  the provider or what is the name of the office in which the patient attends annual eye exams? [facility] If pt is not established with a provider, would they like to be referred to a provider to establish care? No .   Dental Screening: Recommended annual dental exams for proper oral hygiene  Community Resource Referral / Chronic Care Management: CRR required this visit?  No   CCM required this visit?  No      Plan:     I have personally reviewed and noted the following in the patient's chart:   Medical and social history Use of alcohol, tobacco or illicit drugs  Current medications and supplements including opioid prescriptions. Patient is not currently taking opioid prescriptions. Functional ability and status Nutritional status Physical activity Advanced directives List of other physicians Hospitalizations, surgeries, and ER visits in previous 12 months Vitals Screenings to include cognitive, depression, and falls Referrals and appointments  In addition, I have reviewed and discussed with patient certain preventive protocols, quality metrics, and best practice recommendations. A written personalized care plan for preventive services as well as general preventive health recommendations were  provided to patient.     Barb Merino, LPN   09/26/3084   Nurse Notes: none  Due to this being a virtual visit, the after visit summary with patients personalized plan was offered to patient via mail or my-chart.  Patient would like to access on my-chart

## 2022-09-19 ENCOUNTER — Other Ambulatory Visit (HOSPITAL_BASED_OUTPATIENT_CLINIC_OR_DEPARTMENT_OTHER): Payer: Self-pay

## 2022-09-30 NOTE — Assessment & Plan Note (Signed)
Patient's blood pressure is well-controlled with her current regimen.  Plan: - Continue current antihypertensive medication without changes. - Encourage regular physical activity to maintain blood pressure control.

## 2022-09-30 NOTE — Assessment & Plan Note (Addendum)
Patient recently was told of [name] variant since appx 2008 noted on imaging. She is asymptomatic and falls into the category with normal pressure hydrocephalus.  Plan: - Continue to monitor for any new or worsening symptoms, such as changes in gait, inability to stand up straight, dizziness, severe headaches, and vision changes. If any of these symptoms occur, the patient should seek medical attention for further evaluation and management.

## 2022-10-02 DIAGNOSIS — H524 Presbyopia: Secondary | ICD-10-CM | POA: Diagnosis not present

## 2022-10-18 ENCOUNTER — Other Ambulatory Visit (HOSPITAL_BASED_OUTPATIENT_CLINIC_OR_DEPARTMENT_OTHER): Payer: Self-pay

## 2022-11-12 ENCOUNTER — Other Ambulatory Visit (HOSPITAL_BASED_OUTPATIENT_CLINIC_OR_DEPARTMENT_OTHER): Payer: Self-pay

## 2022-11-12 ENCOUNTER — Other Ambulatory Visit: Payer: Self-pay

## 2023-01-16 ENCOUNTER — Other Ambulatory Visit (HOSPITAL_BASED_OUTPATIENT_CLINIC_OR_DEPARTMENT_OTHER): Payer: Self-pay

## 2023-01-17 ENCOUNTER — Other Ambulatory Visit (HOSPITAL_BASED_OUTPATIENT_CLINIC_OR_DEPARTMENT_OTHER): Payer: Self-pay

## 2023-01-21 ENCOUNTER — Other Ambulatory Visit (HOSPITAL_BASED_OUTPATIENT_CLINIC_OR_DEPARTMENT_OTHER): Payer: Self-pay

## 2023-03-17 ENCOUNTER — Other Ambulatory Visit (HOSPITAL_BASED_OUTPATIENT_CLINIC_OR_DEPARTMENT_OTHER): Payer: Self-pay

## 2023-04-18 ENCOUNTER — Other Ambulatory Visit: Payer: Self-pay | Admitting: Nurse Practitioner

## 2023-04-19 ENCOUNTER — Other Ambulatory Visit (HOSPITAL_BASED_OUTPATIENT_CLINIC_OR_DEPARTMENT_OTHER): Payer: Self-pay

## 2023-04-21 ENCOUNTER — Other Ambulatory Visit: Payer: Self-pay

## 2023-04-21 ENCOUNTER — Other Ambulatory Visit (HOSPITAL_BASED_OUTPATIENT_CLINIC_OR_DEPARTMENT_OTHER): Payer: Self-pay

## 2023-04-21 MED ORDER — FLUTICASONE PROPIONATE 50 MCG/ACT NA SUSP
2.0000 | Freq: Every day | NASAL | 3 refills | Status: DC
  Filled 2023-04-21: qty 16, 28d supply, fill #0

## 2023-04-29 ENCOUNTER — Encounter: Payer: Self-pay | Admitting: Nurse Practitioner

## 2023-04-29 ENCOUNTER — Ambulatory Visit: Payer: Medicare Other | Admitting: Nurse Practitioner

## 2023-04-29 ENCOUNTER — Other Ambulatory Visit (HOSPITAL_BASED_OUTPATIENT_CLINIC_OR_DEPARTMENT_OTHER): Payer: Self-pay

## 2023-04-29 VITALS — BP 130/72 | HR 64 | Ht 59.0 in | Wt 116.8 lb

## 2023-04-29 DIAGNOSIS — I10 Essential (primary) hypertension: Secondary | ICD-10-CM

## 2023-04-29 DIAGNOSIS — K2100 Gastro-esophageal reflux disease with esophagitis, without bleeding: Secondary | ICD-10-CM

## 2023-04-29 DIAGNOSIS — Z23 Encounter for immunization: Secondary | ICD-10-CM

## 2023-04-29 DIAGNOSIS — Z0000 Encounter for general adult medical examination without abnormal findings: Secondary | ICD-10-CM

## 2023-04-29 DIAGNOSIS — M810 Age-related osteoporosis without current pathological fracture: Secondary | ICD-10-CM

## 2023-04-29 DIAGNOSIS — E785 Hyperlipidemia, unspecified: Secondary | ICD-10-CM

## 2023-04-29 DIAGNOSIS — E538 Deficiency of other specified B group vitamins: Secondary | ICD-10-CM | POA: Diagnosis not present

## 2023-04-29 DIAGNOSIS — E039 Hypothyroidism, unspecified: Secondary | ICD-10-CM

## 2023-04-29 DIAGNOSIS — L578 Other skin changes due to chronic exposure to nonionizing radiation: Secondary | ICD-10-CM

## 2023-04-29 DIAGNOSIS — F5101 Primary insomnia: Secondary | ICD-10-CM

## 2023-04-29 DIAGNOSIS — F959 Tic disorder, unspecified: Secondary | ICD-10-CM

## 2023-04-29 LAB — LIPID PANEL

## 2023-04-29 MED ORDER — FENOFIBRATE 54 MG PO TABS
54.0000 mg | ORAL_TABLET | Freq: Every day | ORAL | 3 refills | Status: DC
  Filled 2023-04-29 – 2023-05-13 (×2): qty 90, 90d supply, fill #0
  Filled 2023-08-19: qty 90, 90d supply, fill #1
  Filled 2023-12-13: qty 90, 90d supply, fill #2
  Filled 2024-03-01: qty 90, 90d supply, fill #3
  Filled ????-??-??: fill #3

## 2023-04-29 MED ORDER — ROSUVASTATIN CALCIUM 5 MG PO TABS
5.0000 mg | ORAL_TABLET | Freq: Every day | ORAL | 3 refills | Status: DC
  Filled 2023-04-29 – 2023-08-19 (×2): qty 90, 90d supply, fill #0
  Filled 2023-11-10: qty 90, 90d supply, fill #1
  Filled 2024-01-27: qty 90, 90d supply, fill #2
  Filled 2024-04-14: qty 90, 90d supply, fill #3

## 2023-04-29 MED ORDER — LISINOPRIL 10 MG PO TABS
10.0000 mg | ORAL_TABLET | Freq: Every day | ORAL | 3 refills | Status: DC
  Filled 2023-04-29 – 2023-08-19 (×2): qty 90, 90d supply, fill #0
  Filled 2023-11-10: qty 90, 90d supply, fill #1
  Filled 2024-01-27: qty 90, 90d supply, fill #2
  Filled 2024-04-14: qty 90, 90d supply, fill #3

## 2023-04-29 MED ORDER — TRETINOIN 0.05 % EX CREA
TOPICAL_CREAM | CUTANEOUS | 1 refills | Status: DC
  Filled 2023-04-29 – 2023-05-09 (×3): qty 40, 30d supply, fill #0
  Filled 2024-01-27: qty 40, 30d supply, fill #1

## 2023-04-29 MED ORDER — LEVOTHYROXINE SODIUM 75 MCG PO TABS
75.0000 ug | ORAL_TABLET | Freq: Every morning | ORAL | 3 refills | Status: DC
  Filled 2023-04-29 – 2023-07-21 (×2): qty 90, 90d supply, fill #0
  Filled 2023-10-16: qty 90, 90d supply, fill #1
  Filled 2024-01-27: qty 90, 90d supply, fill #2
  Filled 2024-04-14: qty 90, 90d supply, fill #3

## 2023-04-29 MED ORDER — AMITRIPTYLINE HCL 150 MG PO TABS
150.0000 mg | ORAL_TABLET | Freq: Every day | ORAL | 3 refills | Status: DC
  Filled 2023-04-29 – 2023-09-16 (×2): qty 90, 90d supply, fill #0
  Filled 2023-12-13: qty 90, 90d supply, fill #1
  Filled 2024-03-01: qty 90, 90d supply, fill #2
  Filled 2024-04-14: qty 90, 90d supply, fill #3
  Filled ????-??-??: fill #2

## 2023-04-29 MED ORDER — OMEPRAZOLE 20 MG PO CPDR
20.0000 mg | DELAYED_RELEASE_CAPSULE | Freq: Every day | ORAL | 3 refills | Status: DC
  Filled 2023-04-29 – 2023-05-13 (×2): qty 90, 90d supply, fill #0
  Filled 2023-08-19: qty 90, 90d supply, fill #1
  Filled 2023-11-10: qty 90, 90d supply, fill #2
  Filled 2024-01-27: qty 90, 90d supply, fill #3

## 2023-04-29 MED ORDER — CYANOCOBALAMIN 1000 MCG/ML IJ SOLN
1000.0000 ug | Freq: Once | INTRAMUSCULAR | Status: AC
  Administered 2023-04-29: 1000 ug via INTRAMUSCULAR

## 2023-04-29 NOTE — Patient Instructions (Addendum)
Let us know when you want to have your ears cleaned and we can do this.    For all adult patients, I recommend A well balanced diet low in saturated fats, cholesterol, and moderation in carbohydrates.   This can be as simple as monitoring portion sizes and cutting back on sugary beverages such as soda and juice to start with.    Daily water consumption of at least 64 ounces.  Physical activity at least 180 minutes per week, if just starting out.   This can be as simple as taking the stairs instead of the elevator and walking 2-3 laps around the office  purposefully every day.   STD protection, partner selection, and regular testing if high risk.  Limited consumption of alcoholic beverages if alcohol is consumed.  For women, I recommend no more than 7 alcoholic beverages per week, spread out throughout the week.  Avoid "binge" drinking or consuming large quantities of alcohol in one setting.   Please let me know if you feel you may need help with reduction or quitting alcohol consumption.   Avoidance of nicotine, if used.  Please let me know if you feel you may need help with reduction or quitting nicotine use.   Daily mental health attention.  This can be in the form of 5 minute daily meditation, prayer, journaling, yoga, reflection, etc.   Purposeful attention to your emotions and mental state can significantly improve your overall wellbeing  and  Health.  Please know that I am here to help you with all of your health care goals and am happy to work with you to find a solution that works best for you.  The greatest advice I have received with any changes in life are to take it one step at a time, that even means if all you can focus on is the next 60 seconds, then do that and celebrate your victories.  With any changes in life, you will have set backs, and that is OK. The important thing to remember is, if you have a set back, it is not a failure, it is an opportunity to try  again!  Health Maintenance Recommendations Screening Testing Mammogram Every 1 -2 years based on history and risk factors Starting at age 70 Pap Smear Ages 21-39 every 3 years Ages 44-65 every 5 years with HPV testing More frequent testing may be required based on results and history Colon Cancer Screening Every 1-10 years based on test performed, risk factors, and history Starting at age 36 Bone Density Screening Every 2-10 years based on history Starting at age 87 for women Recommendations for men differ based on medication usage, history, and risk factors AAA Screening One time ultrasound Men 38-61 years old who have every smoked Lung Cancer Screening Low Dose Lung CT every 12 months Age 21-80 years with a 30 pack-year smoking history who still smoke or who have quit within the last 15 years  Screening Labs Routine  Labs: Complete Blood Count (CBC), Complete Metabolic Panel (CMP), Cholesterol (Lipid Panel) Every 6-12 months based on history and medications May be recommended more frequently based on current conditions or previous results Hemoglobin A1c Lab Every 3-12 months based on history and previous results Starting at age 69 or earlier with diagnosis of diabetes, high cholesterol, BMI >26, and/or risk factors Frequent monitoring for patients with diabetes to ensure blood sugar control Thyroid Panel (TSH w/ T3 & T4) Every 6 months based on history, symptoms, and risk factors May be  repeated more often if on medication HIV One time testing for all patients 13 and older May be repeated more frequently for patients with increased risk factors or exposure Hepatitis C One time testing for all patients 12 and older May be repeated more frequently for patients with increased risk factors or exposure Gonorrhea, Chlamydia Every 12 months for all sexually active persons 13-24 years Additional monitoring may be recommended for those who are considered high risk or who have  symptoms PSA Men 9-31 years old with risk factors Additional screening may be recommended from age 79-69 based on risk factors, symptoms, and history  Vaccine Recommendations Tetanus Booster All adults every 10 years Flu Vaccine All patients 6 months and older every year COVID Vaccine All patients 12 years and older Initial dosing with booster May recommend additional booster based on age and health history HPV Vaccine 2 doses all patients age 65-26 Dosing may be considered for patients over 26 Shingles Vaccine (Shingrix) 2 doses all adults 55 years and older Pneumonia (Pneumovax 23) All adults 65 years and older May recommend earlier dosing based on health history Pneumonia (Prevnar 81) All adults 65 years and older Dosed 1 year after Pneumovax 23  Additional Screening, Testing, and Vaccinations may be recommended on an individualized basis based on family history, health history, risk factors, and/or exposure.

## 2023-04-29 NOTE — Progress Notes (Signed)
[name], DNP, AGNP-c [facility] [address] [place], [place] [address] Main Office [phone]  BP 130/72   Pulse 64   Ht 4\' 11"  (1.499 m)   Wt 116 lb 12.8 oz (53 kg)   BMI 23.59 kg/m    Subjective:    Patient ID: Bonnie Sanders, female    DOB: Jun 04, 1945, 78 y.o.   MRN: [id number]  HPI: Bonnie Sanders is a 78 y.o. female presenting on 04/29/2023 for comprehensive medical examination.   History of Present Illness The patient, with a history of thyroid issues and prediabetes, presented for a routine physical examination. She reported no concerns with her thyroid and has been monitoring her blood pressure at home, which has been within normal limits since she stopped consuming salt. The patient also mentioned a history of reflux issues, but these have been well-managed with omeprazole as per her gastroenterologist's advice.  The patient has been proactive in managing her health, participating in regular exercise at a local health center. She reported no changes in bowel or bladder habits and has been consuming foods that promote healthy bowel function.    She also mentioned a history of neck pain, which she described as a tension headache that extends up the back of her head. This pain has been managed with a combination of over-the-counter medications as recommended by her dentist.  The patient also reported a recent fall, but there have been no lingering issues since the incident.    She has been using tretinoin for her skin, which has been effective, although she noted some dryness due to the cold weather. She has been managing this with a heavy cream at night.  The patient's spouse recently had a pacemaker inserted due to congestive heart failure. The patient has been supporting her spouse through this process, which has added some stress to her life. Despite this, the patient reported feeling generally well. She requested a Vitamin B12 shot, as she  felt the over-the-counter vitamins were not effective. She also expressed interest in receiving the pneumonia vaccine.  Pertinent items are noted in HPI.  She has had flu, COVID, and RSV vaccines.  She is due for pneumonia vaccine - recommend 20   Most Recent Depression Screen:     04/29/2023    9:55 AM 09/17/2022    3:35 PM 08/19/2022    8:08 AM 02/18/2022    8:34 AM 10/16/2021   10:56 AM  Depression screen PHQ 2/9  Decreased Interest 0 0 0 0 0  Down, Depressed, Hopeless 0 0 0 0 0  PHQ - 2 Score 0 0 0 0 0  Altered sleeping    0 0  Tired, decreased energy    1 0  Change in appetite    0 0  Feeling bad or failure about yourself     0 0  Trouble concentrating    0 0  Moving slowly or fidgety/restless    0 0  Suicidal thoughts    0 0  PHQ-9 Score    1 0  Difficult doing work/chores     Not difficult at all   Most Recent Anxiety Screen:     02/18/2022    8:34 AM 06/06/2021    8:07 AM  GAD 7 : Generalized Anxiety Score  Nervous, Anxious, on Edge 0 0  Control/stop worrying 1 0  Worry too much - different things 0 0  Trouble relaxing 0 0  Restless 0 0  Easily annoyed or irritable  0 0  Afraid - awful might happen 0 0  Total GAD 7 Score 1 0  Anxiety Difficulty  Not difficult at all   Most Recent Fall Screen:    04/29/2023    9:54 AM 09/17/2022    3:33 PM 08/19/2022    8:07 AM 02/18/2022    8:37 AM 11/14/2021    8:36 AM  Fall Risk   Falls in the past year? 1 1 0 1 0  Comment  tripped     Number falls in past yr: 0 0 0 0 0  Injury with Fall? 0 0 0 0 0  Risk for fall due to : No Fall Risks Medication side effect No Fall Risks    Follow up Falls evaluation completed Falls prevention discussed;Education provided;Falls evaluation completed Falls evaluation completed  Falls evaluation completed;Education provided    Past medical history, surgical history, medications, allergies, family history and social history reviewed with patient today and changes made to appropriate areas of  the chart.  Past Medical History:  Past Medical History:  Diagnosis Date   Allergic rhinitis    Allergic rhinitis due to pollen 11/30/2010   Depression    Dyslipidemia    Fecal incontinence 01/28/2017   Formatting of this note might be different from the original. Normal rectal exam.  Episodes are likely due to the nighttime amitriptyline   GERD (gastroesophageal reflux disease)    Heme positive stool    Hip pain, left 11/17/2013   Hyperlipidemia    Hypertension    Hypothyroid    Muscle cramping 10/10/2021   Osteoporosis    Other screening mammogram    Pain in both lower extremities 10/10/2021   Primary osteoarthritis of left knee 07/25/2015   Medications:  Current Outpatient Medications on File Prior to Visit  Medication Sig   B Complex Vitamins (VITAMIN B COMPLEX PO) Take by mouth.   montelukast (SINGULAIR) 10 MG tablet Take 1 tablet (10 mg total) by mouth at bedtime. (Patient not taking: Reported on 04/29/2023)   No current facility-administered medications on file prior to visit.   Surgical History:  Past Surgical History:  Procedure Laterality Date   ABDOMINAL HYSTERECTOMY     COLONOSCOPY  2009   FOOT SURGERY     TOTAL KNEE ARTHROPLASTY     right foot   Allergies:  Allergies  Allergen Reactions   Codeine Anxiety and Rash    Other Reaction(s): Angioedema   Statins Other (See Comments)    myalgias   Family History:  Family History  Problem Relation Age of Onset   Hypertension Mother    Arthritis Mother    Hypertension Father    Heart disease Father    Hyperlipidemia Father    Arthritis Father    Hypertension Maternal Grandmother    Stroke Maternal Grandmother    Heart disease Maternal Grandmother    Arthritis Maternal Grandmother    Hypertension Maternal Grandfather    Arthritis Maternal Grandfather    Hypertension Paternal Grandmother    Arthritis Paternal Grandmother    Hypertension Paternal Grandfather    Arthritis Paternal Grandfather         Objective:    BP 130/72   Pulse 64   Ht 4\' 11"  (1.499 m)   Wt 116 lb 12.8 oz (53 kg)   BMI 23.59 kg/m   Wt Readings from Last 3 Encounters:  04/29/23 116 lb 12.8 oz (53 kg)  09/17/22 113 lb (51.3 kg)  08/30/22 115 lb 12.8 oz (52.5 kg)  Physical Exam Vitals and nursing note reviewed.  Constitutional:      General: She is not in acute distress.    Appearance: Normal appearance.  HENT:     Head: Normocephalic and atraumatic.     Right Ear: Tympanic membrane, ear canal and external ear normal. Decreased hearing noted. There is impacted cerumen.     Left Ear: Tympanic membrane, ear canal and external ear normal. Decreased hearing noted. There is impacted cerumen.     Ears:     Comments: Hearing aids present with normal hearing with use.     Nose: Nose normal.     Right Sinus: No maxillary sinus tenderness or frontal sinus tenderness.     Left Sinus: No maxillary sinus tenderness or frontal sinus tenderness.     Mouth/Throat:     Lips: Pink.     Mouth: Mucous membranes are moist.     Pharynx: Oropharynx is clear.  Eyes:     General: Lids are normal. Vision grossly intact.     Extraocular Movements: Extraocular movements intact.     Conjunctiva/sclera: Conjunctivae normal.     Pupils: Pupils are equal, round, and reactive to light.     Funduscopic exam:    Right eye: Red reflex present.        Left eye: Red reflex present.    Visual Fields: Right eye visual fields normal and left eye visual fields normal.  Neck:     Thyroid: No thyromegaly.     Vascular: No carotid bruit.  Cardiovascular:     Rate and Rhythm: Normal rate and regular rhythm.     Chest Wall: PMI is not displaced.     Pulses: Normal pulses.          Dorsalis pedis pulses are 2+ on the right side and 2+ on the left side.       Posterior tibial pulses are 2+ on the right side and 2+ on the left side.     Heart sounds: Normal heart sounds. No murmur heard. Pulmonary:     Effort: Pulmonary effort is normal.  No respiratory distress.     Breath sounds: Normal breath sounds.  Abdominal:     General: Abdomen is flat. Bowel sounds are normal. There is no distension.     Palpations: Abdomen is soft. There is no hepatomegaly, splenomegaly or mass.     Tenderness: There is no abdominal tenderness. There is no right CVA tenderness, left CVA tenderness, guarding or rebound.  Musculoskeletal:        General: Normal range of motion.     Cervical back: Full passive range of motion without pain, normal range of motion and neck supple. No tenderness.     Right lower leg: No edema.     Left lower leg: No edema.  Feet:     Left foot:     Toenail Condition: Left toenails are normal.  Lymphadenopathy:     Cervical: No cervical adenopathy.     Upper Body:     Right upper body: No supraclavicular adenopathy.     Left upper body: No supraclavicular adenopathy.  Skin:    General: Skin is warm and dry.     Capillary Refill: Capillary refill takes less than 2 seconds.     Nails: There is no clubbing.  Neurological:     General: No focal deficit present.     Mental Status: She is alert and oriented to person, place, and time.     GCS:  GCS eye subscore is 4. GCS verbal subscore is 5. GCS motor subscore is 6.     Sensory: Sensation is intact.     Motor: Tremor present. No weakness.     Coordination: Coordination is intact.     Gait: Gait is intact.     Deep Tendon Reflexes: Reflexes are normal and symmetric.  Psychiatric:        Attention and Perception: Attention normal.        Mood and Affect: Mood normal.        Speech: Speech normal.        Behavior: Behavior normal. Behavior is cooperative.        Thought Content: Thought content normal.        Cognition and Memory: Cognition and memory normal.        Judgment: Judgment normal.      Results for orders placed or performed in visit on 04/29/23  CBC with Differential/Platelet   Collection Time: 04/29/23 11:07 AM  Result Value Ref Range   WBC 6.6  3.4 - 10.8 x10E3/uL   RBC 3.95 3.77 - 5.28 x10E6/uL   Hemoglobin 11.9 11.1 - 15.9 g/dL   Hematocrit 18.8 41.6 - 46.6 %   MCV 90 79 - 97 fL   MCH 30.1 26.6 - 33.0 pg   MCHC 33.4 31.5 - 35.7 g/dL   RDW 60.6 30.1 - 60.1 %   Platelets 264 150 - 450 x10E3/uL   Neutrophils 66 Not Estab. %   Lymphs 23 Not Estab. %   Monocytes 7 Not Estab. %   Eos 2 Not Estab. %   Basos 1 Not Estab. %   Neutrophils Absolute 4.4 1.4 - 7.0 x10E3/uL   Lymphocytes Absolute 1.5 0.7 - 3.1 x10E3/uL   Monocytes Absolute 0.5 0.1 - 0.9 x10E3/uL   EOS (ABSOLUTE) 0.2 0.0 - 0.4 x10E3/uL   Basophils Absolute 0.1 0.0 - 0.2 x10E3/uL   Immature Granulocytes 1 Not Estab. %   Immature Grans (Abs) 0.0 0.0 - 0.1 x10E3/uL  CMP14+EGFR   Collection Time: 04/29/23 11:07 AM  Result Value Ref Range   Glucose 82 70 - 99 mg/dL   BUN 25 8 - 27 mg/dL   Creatinine, Ser 0.93 0.57 - 1.00 mg/dL   eGFR 72 >23 FT/DDU/2.02   BUN/Creatinine Ratio 30 (H) 12 - 28   Sodium 141 134 - 144 mmol/L   Potassium 4.6 3.5 - 5.2 mmol/L   Chloride 103 96 - 106 mmol/L   CO2 22 20 - 29 mmol/L   Calcium 10.6 (H) 8.7 - 10.3 mg/dL   Total Protein 7.0 6.0 - 8.5 g/dL   Albumin 4.6 3.8 - 4.8 g/dL   Globulin, Total 2.4 1.5 - 4.5 g/dL   Bilirubin Total 0.3 0.0 - 1.2 mg/dL   Alkaline Phosphatase 48 44 - 121 IU/L   AST 19 0 - 40 IU/L   ALT 10 0 - 32 IU/L  Hemoglobin A1c   Collection Time: 04/29/23 11:07 AM  Result Value Ref Range   Hgb A1c MFr Bld 6.1 (H) 4.8 - 5.6 %   Est. average glucose Bld gHb Est-mCnc 128 mg/dL  Lipid panel   Collection Time: 04/29/23 11:07 AM  Result Value Ref Range   Cholesterol, Total 195 100 - 199 mg/dL   Triglycerides 542 (H) 0 - 149 mg/dL   HDL 51 >70 mg/dL   VLDL Cholesterol Cal 32 5 - 40 mg/dL   LDL Chol Calc (NIH) 623 (H) 0 - 99 mg/dL  Chol/HDL Ratio 3.8 0.0 - 4.4 ratio  TSH   Collection Time: 04/29/23 11:07 AM  Result Value Ref Range   TSH 1.930 0.450 - 4.500 uIU/mL  Vitamin B12   Collection Time: 04/29/23 11:07  AM  Result Value Ref Range   Vitamin B-12 353 232 - 1,245 pg/mL  VITAMIN D 25 Hydroxy (Vit-D Deficiency, Fractures)   Collection Time: 04/29/23 11:07 AM  Result Value Ref Range   Vit D, 25-Hydroxy 78.9 30.0 - 100.0 ng/mL       Assessment & Plan:   Problem List Items Addressed This Visit     Hypothyroid   Well managed with levothyroxine without any concerns at this time. Will monitor labs today. Stable for quite some time so annual evaluation appropriate unless symptoms or dose changes required.  - OK to refill for 1 year      Relevant Medications   levothyroxine (SYNTHROID) 75 MCG tablet   Other Relevant Orders   CBC with Differential/Platelet (Completed)   CMP14+EGFR (Completed)   Hemoglobin A1c (Completed)   Lipid panel (Completed)   TSH (Completed)   Vitamin B12 (Completed)   VITAMIN D 25 Hydroxy (Vit-D Deficiency, Fractures) (Completed)   Hypertension   Hypertension is well-controlled with lifestyle modifications, specifically reducing salt intake. Blood pressure readings are consistently in the 120s/60s range. - Continue monitoring blood pressure regularly - Maintain low-salt diet      Relevant Medications   fenofibrate 54 MG tablet   lisinopril (ZESTRIL) 10 MG tablet   rosuvastatin (CRESTOR) 5 MG tablet   Other Relevant Orders   CBC with Differential/Platelet (Completed)   CMP14+EGFR (Completed)   Hemoglobin A1c (Completed)   Lipid panel (Completed)   TSH (Completed)   Vitamin B12 (Completed)   VITAMIN D 25 Hydroxy (Vit-D Deficiency, Fractures) (Completed)   Osteoporosis   Continue with weight bearing exercises, vitamin D and calcium supplementation.       Relevant Orders   CBC with Differential/Platelet (Completed)   CMP14+EGFR (Completed)   Hemoglobin A1c (Completed)   Lipid panel (Completed)   TSH (Completed)   Vitamin B12 (Completed)   VITAMIN D 25 Hydroxy (Vit-D Deficiency, Fractures) (Completed)   Sun-damaged skin   Continue current treatment  options. Wear sunscreen daily to reduce risk of further damage and exposure      Relevant Medications   tretinoin (RETIN-A) 0.05 % cream   Gastro-esophageal reflux disease with esophagitis   Hypertension is well-controlled with lifestyle modifications, specifically reducing salt intake. Blood pressure readings are consistently in the 120s/60s range. - Continue monitoring blood pressure regularly - Maintain low-salt diet      Relevant Medications   omeprazole (PRILOSEC) 20 MG capsule   Tic disorder, unspecified   Chronic with no new symptoms or exacerbation.       Relevant Medications   amitriptyline (ELAVIL) 150 MG tablet   Other Relevant Orders   CBC with Differential/Platelet (Completed)   CMP14+EGFR (Completed)   Hemoglobin A1c (Completed)   Lipid panel (Completed)   TSH (Completed)   Vitamin B12 (Completed)   VITAMIN D 25 Hydroxy (Vit-D Deficiency, Fractures) (Completed)   B12 deficiency   Patient not feeling benefit from oral treatment. Will start injection based treatment today with plan to repeat every 30 days and repeat labs in 3 months prior to injection to ensure efficacy.       Primary insomnia   Amitriptyline at bedtime helpful. No changes at this time. Monitor stress levels. If sleep becomes irratic to notify provider for further  recommendations. May consider trazodone.       Relevant Medications   amitriptyline (ELAVIL) 150 MG tablet   Encounter for annual physical exam - Primary   Relevant Orders   CBC with Differential/Platelet (Completed)   CMP14+EGFR (Completed)   Hemoglobin A1c (Completed)   Lipid panel (Completed)   TSH (Completed)   Vitamin B12 (Completed)   VITAMIN D 25 Hydroxy (Vit-D Deficiency, Fractures) (Completed)   Hyperlipidemia with target low density lipoprotein (LDL) cholesterol less than 130 mg/dL   Relevant Medications   fenofibrate 54 MG tablet   lisinopril (ZESTRIL) 10 MG tablet   rosuvastatin (CRESTOR) 5 MG tablet   Other  Relevant Orders   CBC with Differential/Platelet (Completed)   CMP14+EGFR (Completed)   Hemoglobin A1c (Completed)   Lipid panel (Completed)   TSH (Completed)   Vitamin B12 (Completed)   VITAMIN D 25 Hydroxy (Vit-D Deficiency, Fractures) (Completed)   Other Visit Diagnoses       Need for vaccination against Streptococcus pneumoniae       Relevant Orders   Pneumococcal conjugate vaccine 20-valent (Prevnar 20) (Completed)         Follow up plan: Return in about 1 year (around 04/28/2024) for OK to schedule ear lavage as needed- CPE in 12 months.  NEXT PREVENTATIVE PHYSICAL DUE IN 1 YEAR.  PATIENT COUNSELING PROVIDED FOR ALL ADULT PATIENTS: A well balanced diet low in saturated fats, cholesterol, and moderation in carbohydrates.  This can be as simple as monitoring portion sizes and cutting back on sugary beverages such as soda and juice to start with.    Daily water consumption of at least 64 ounces.  Physical activity at least 180 minutes per week.  If just starting out, start 10 minutes a day and work your way up.   This can be as simple as taking the stairs instead of the elevator and walking 2-3 laps around the office  purposefully every day.   STD protection, partner selection, and regular testing if high risk.  Limited consumption of alcoholic beverages if alcohol is consumed. For men, I recommend no more than 14 alcoholic beverages per week, spread out throughout the week (max 2 per day). Avoid "binge" drinking or consuming large quantities of alcohol in one setting.  Please let me know if you feel you may need help with reduction or quitting alcohol consumption.   Avoidance of nicotine, if used. Please let me know if you feel you may need help with reduction or quitting nicotine use.   Daily mental health attention. This can be in the form of 5 minute daily meditation, prayer, journaling, yoga, reflection, etc.  Purposeful attention to your emotions and mental state  can significantly improve your overall wellbeing  and  Health.  Please know that I am here to help you with all of your health care goals and am happy to work with you to find a solution that works best for you.  The greatest advice I have received with any changes in life are to take it one step at a time, that even means if all you can focus on is the next 60 seconds, then do that and celebrate your victories.  With any changes in life, you will have set backs, and that is OK. The important thing to remember is, if you have a set back, it is not a failure, it is an opportunity to try again! Screening Testing Mammogram Every 1 -2 years based on history and risk factors Starting at  age 68 Pap Smear Ages 21-39 every 3 years Ages 25-65 every 5 years with HPV testing More frequent testing may be required based on results and history Colon Cancer Screening Every 1-10 years based on test performed, risk factors, and history Starting at age 81 Bone Density Screening Every 2-10 years based on history Starting at age 35 for women Recommendations for men differ based on medication usage, history, and risk factors AAA Screening One time ultrasound Men 23-27 years old who have every smoked Lung Cancer Screening Low Dose Lung CT every 12 months Age 66-80 years with a 30 pack-year smoking history who still smoke or who have quit within the last 15 years   Screening Labs Routine  Labs: Complete Blood Count (CBC), Complete Metabolic Panel (CMP), Cholesterol (Lipid Panel) Every 6-12 months based on history and medications May be recommended more frequently based on current conditions or previous results Hemoglobin A1c Lab Every 3-12 months based on history and previous results Starting at age 78 or earlier with diagnosis of diabetes, high cholesterol, BMI >26, and/or risk factors Frequent monitoring for patients with diabetes to ensure blood sugar control Thyroid Panel (TSH) Every 6 months based  on history, symptoms, and risk factors May be repeated more often if on medication HIV One time testing for all patients 37 and older May be repeated more frequently for patients with increased risk factors or exposure Hepatitis C One time testing for all patients 10 and older May be repeated more frequently for patients with increased risk factors or exposure Gonorrhea, Chlamydia Every 12 months for all sexually active persons 13-24 years Additional monitoring may be recommended for those who are considered high risk or who have symptoms Every 12 months for any woman on birth control, regardless of sexual activity PSA Men 27-68 years old with risk factors Additional screening may be recommended from age 1-69 based on risk factors, symptoms, and history  Vaccine Recommendations Tetanus Booster All adults every 10 years Flu Vaccine All patients 6 months and older every year COVID Vaccine All patients 12 years and older Initial dosing with booster May recommend additional booster based on age and health history HPV Vaccine 2 doses all patients age 66-26 Dosing may be considered for patients over 26 Shingles Vaccine (Shingrix) 2 doses all adults 55 years and older Pneumonia (Pneumovax 40) All adults 65 years and older May recommend earlier dosing based on health history One year apart from Prevnar 26 Pneumonia (Prevnar 6) All adults 65 years and older Dosed 1 year after Pneumovax 23 Pneumonia (Prevnar 20) One time alternative to the two dosing of 13 and 23 For all adults with initial dose of 23, 20 is recommended 1 year later For all adults with initial dose of 13, 23 is still recommended as second option 1 year later

## 2023-04-30 ENCOUNTER — Other Ambulatory Visit (HOSPITAL_BASED_OUTPATIENT_CLINIC_OR_DEPARTMENT_OTHER): Payer: Self-pay

## 2023-04-30 ENCOUNTER — Telehealth: Payer: Self-pay

## 2023-04-30 ENCOUNTER — Other Ambulatory Visit (HOSPITAL_COMMUNITY): Payer: Self-pay

## 2023-04-30 LAB — CMP14+EGFR
ALT: 10 [IU]/L (ref 0–32)
AST: 19 [IU]/L (ref 0–40)
Albumin: 4.6 g/dL (ref 3.8–4.8)
Alkaline Phosphatase: 48 [IU]/L (ref 44–121)
BUN/Creatinine Ratio: 30 — ABNORMAL HIGH (ref 12–28)
BUN: 25 mg/dL (ref 8–27)
Bilirubin Total: 0.3 mg/dL (ref 0.0–1.2)
CO2: 22 mmol/L (ref 20–29)
Calcium: 10.6 mg/dL — ABNORMAL HIGH (ref 8.7–10.3)
Chloride: 103 mmol/L (ref 96–106)
Creatinine, Ser: 0.84 mg/dL (ref 0.57–1.00)
Globulin, Total: 2.4 g/dL (ref 1.5–4.5)
Glucose: 82 mg/dL (ref 70–99)
Potassium: 4.6 mmol/L (ref 3.5–5.2)
Sodium: 141 mmol/L (ref 134–144)
Total Protein: 7 g/dL (ref 6.0–8.5)
eGFR: 72 mL/min/{1.73_m2} (ref >59)

## 2023-04-30 LAB — CBC WITH DIFFERENTIAL/PLATELET
Basophils Absolute: 0.1 10*3/uL (ref 0.0–0.2)
Basos: 1 %
EOS (ABSOLUTE): 0.2 10*3/uL (ref 0.0–0.4)
Eos: 2 %
Hematocrit: 35.6 % (ref 34.0–46.6)
Hemoglobin: 11.9 g/dL (ref 11.1–15.9)
Immature Grans (Abs): 0 10*3/uL (ref 0.0–0.1)
Immature Granulocytes: 1 %
Lymphocytes Absolute: 1.5 10*3/uL (ref 0.7–3.1)
Lymphs: 23 %
MCH: 30.1 pg (ref 26.6–33.0)
MCHC: 33.4 g/dL (ref 31.5–35.7)
MCV: 90 fL (ref 79–97)
Monocytes Absolute: 0.5 10*3/uL (ref 0.1–0.9)
Monocytes: 7 %
Neutrophils Absolute: 4.4 10*3/uL (ref 1.4–7.0)
Neutrophils: 66 %
Platelets: 264 10*3/uL (ref 150–450)
RBC: 3.95 x10E6/uL (ref 3.77–5.28)
RDW: 12.1 % (ref 11.7–15.4)
WBC: 6.6 10*3/uL (ref 3.4–10.8)

## 2023-04-30 LAB — LIPID PANEL
Cholesterol, Total: 195 mg/dL (ref 100–199)
HDL: 51 mg/dL (ref >39)
LDL CALC COMMENT:: 3.8 ratio (ref 0.0–4.4)
LDL Chol Calc (NIH): 112 mg/dL — ABNORMAL HIGH (ref 0–99)
Triglycerides: 181 mg/dL — ABNORMAL HIGH (ref 0–149)
VLDL Cholesterol Cal: 32 mg/dL (ref 5–40)

## 2023-04-30 LAB — HEMOGLOBIN A1C
Est. average glucose Bld gHb Est-mCnc: 128 mg/dL
Hgb A1c MFr Bld: 6.1 % — ABNORMAL HIGH (ref 4.8–5.6)

## 2023-04-30 LAB — VITAMIN B12: Vitamin B-12: 353 pg/mL (ref 232–1245)

## 2023-04-30 LAB — VITAMIN D 25 HYDROXY (VIT D DEFICIENCY, FRACTURES): Vit D, 25-Hydroxy: 78.9 ng/mL (ref 30.0–100.0)

## 2023-04-30 LAB — TSH: TSH: 1.93 u[IU]/mL (ref 0.450–4.500)

## 2023-04-30 NOTE — Telephone Encounter (Signed)
Pharmacy Patient Advocate Encounter   Received notification from CoverMyMeds that prior authorization for Tretinoin 0.05% cream is required/requested.   Insurance verification completed.   The patient is insured through [facility] .   Per test claim: PA required; PA submitted to above mentioned insurance via CoverMyMeds Key/confirmation #/EOC Key: [id number]     Status is pending

## 2023-05-01 ENCOUNTER — Other Ambulatory Visit: Payer: Self-pay

## 2023-05-01 ENCOUNTER — Other Ambulatory Visit (HOSPITAL_BASED_OUTPATIENT_CLINIC_OR_DEPARTMENT_OTHER): Payer: Self-pay

## 2023-05-01 ENCOUNTER — Other Ambulatory Visit (HOSPITAL_COMMUNITY): Payer: Self-pay

## 2023-05-01 NOTE — Telephone Encounter (Signed)
Pharmacy Patient Advocate Encounter  Received notification from [facility] that Prior Authorization for {Tretinoin 0.05% cream has been APPROVED  to 1.22.26. Ran test claim, Copay is $RTS AND IS PAYABLE AGAIN ON/AFTER 2.10.25. This test claim was processed through [facility]- copay amounts may vary at other pharmacies due to pharmacy/plan contracts, or as the patient moves through the different stages of their insurance plan.     PA #/Case ID/Reference #: (Key: [id number])

## 2023-05-02 ENCOUNTER — Other Ambulatory Visit: Payer: Self-pay | Admitting: Nurse Practitioner

## 2023-05-02 DIAGNOSIS — Z1231 Encounter for screening mammogram for malignant neoplasm of breast: Secondary | ICD-10-CM

## 2023-05-07 NOTE — Assessment & Plan Note (Signed)
Hypertension is well-controlled with lifestyle modifications, specifically reducing salt intake. Blood pressure readings are consistently in the 120s/60s range. - Continue monitoring blood pressure regularly - Maintain low-salt diet

## 2023-05-07 NOTE — Assessment & Plan Note (Signed)
Chronic with no new symptoms or exacerbation.

## 2023-05-07 NOTE — Assessment & Plan Note (Signed)
Continue current treatment options. Wear sunscreen daily to reduce risk of further damage and exposure

## 2023-05-07 NOTE — Assessment & Plan Note (Signed)
Amitriptyline at bedtime helpful. No changes at this time. Monitor stress levels. If sleep becomes irratic to notify provider for further recommendations. May consider trazodone.

## 2023-05-07 NOTE — Assessment & Plan Note (Signed)
Patient not feeling benefit from oral treatment. Will start injection based treatment today with plan to repeat every 30 days and repeat labs in 3 months prior to injection to ensure efficacy.

## 2023-05-07 NOTE — Assessment & Plan Note (Signed)
Well managed with levothyroxine without any concerns at this time. Will monitor labs today. Stable for quite some time so annual evaluation appropriate unless symptoms or dose changes required.  - OK to refill for 1 year

## 2023-05-07 NOTE — Assessment & Plan Note (Signed)
Continue with weight bearing exercises, vitamin D and calcium supplementation.

## 2023-05-08 ENCOUNTER — Other Ambulatory Visit: Payer: Self-pay

## 2023-05-08 ENCOUNTER — Encounter: Payer: Self-pay | Admitting: Nurse Practitioner

## 2023-05-08 ENCOUNTER — Other Ambulatory Visit (HOSPITAL_BASED_OUTPATIENT_CLINIC_OR_DEPARTMENT_OTHER): Payer: Self-pay

## 2023-05-08 NOTE — Addendum Note (Signed)
Addended by: Ashtyn Meland, Huntley Dec E on: 05/08/2023 07:48 AM   Modules accepted: Orders

## 2023-05-09 ENCOUNTER — Other Ambulatory Visit: Payer: Self-pay

## 2023-05-12 ENCOUNTER — Other Ambulatory Visit (HOSPITAL_BASED_OUTPATIENT_CLINIC_OR_DEPARTMENT_OTHER): Payer: Self-pay

## 2023-05-13 ENCOUNTER — Other Ambulatory Visit: Payer: Self-pay | Admitting: Nurse Practitioner

## 2023-05-13 ENCOUNTER — Other Ambulatory Visit (HOSPITAL_BASED_OUTPATIENT_CLINIC_OR_DEPARTMENT_OTHER): Payer: Self-pay

## 2023-05-13 MED ORDER — MONTELUKAST SODIUM 10 MG PO TABS
10.0000 mg | ORAL_TABLET | Freq: Every day | ORAL | 1 refills | Status: DC
  Filled 2023-05-13: qty 90, 90d supply, fill #0

## 2023-05-14 ENCOUNTER — Ambulatory Visit
Admission: RE | Admit: 2023-05-14 | Discharge: 2023-05-14 | Disposition: A | Discharge status: Home / Self Care | Payer: Medicare Other | Source: Ambulatory Visit | Attending: Nurse Practitioner

## 2023-05-14 DIAGNOSIS — Z1231 Encounter for screening mammogram for malignant neoplasm of breast: Secondary | ICD-10-CM | POA: Diagnosis not present

## 2023-05-19 ENCOUNTER — Encounter: Payer: Self-pay | Admitting: Nurse Practitioner

## 2023-06-04 ENCOUNTER — Encounter (HOSPITAL_BASED_OUTPATIENT_CLINIC_OR_DEPARTMENT_OTHER): Payer: Self-pay

## 2023-06-04 ENCOUNTER — Other Ambulatory Visit (HOSPITAL_BASED_OUTPATIENT_CLINIC_OR_DEPARTMENT_OTHER): Payer: Self-pay

## 2023-06-04 ENCOUNTER — Other Ambulatory Visit: Payer: Self-pay

## 2023-06-04 ENCOUNTER — Emergency Department (HOSPITAL_BASED_OUTPATIENT_CLINIC_OR_DEPARTMENT_OTHER)
Admission: EM | Admit: 2023-06-04 | Discharge: 2023-06-04 | Disposition: A | Discharge status: Home / Self Care | Payer: Medicare Other | Attending: Emergency Medicine | Admitting: Emergency Medicine

## 2023-06-04 DIAGNOSIS — I10 Essential (primary) hypertension: Secondary | ICD-10-CM | POA: Insufficient documentation

## 2023-06-04 DIAGNOSIS — E860 Dehydration: Secondary | ICD-10-CM | POA: Diagnosis not present

## 2023-06-04 DIAGNOSIS — R112 Nausea with vomiting, unspecified: Secondary | ICD-10-CM | POA: Diagnosis not present

## 2023-06-04 DIAGNOSIS — D72819 Decreased white blood cell count, unspecified: Secondary | ICD-10-CM | POA: Insufficient documentation

## 2023-06-04 DIAGNOSIS — R197 Diarrhea, unspecified: Secondary | ICD-10-CM | POA: Diagnosis not present

## 2023-06-04 DIAGNOSIS — Z79899 Other long term (current) drug therapy: Secondary | ICD-10-CM | POA: Diagnosis not present

## 2023-06-04 DIAGNOSIS — E039 Hypothyroidism, unspecified: Secondary | ICD-10-CM | POA: Diagnosis not present

## 2023-06-04 LAB — CBC
HCT: 37.7 % (ref 36.0–46.0)
Hemoglobin: 12.3 g/dL (ref 12.0–15.0)
MCH: 29.8 pg (ref 26.0–34.0)
MCHC: 32.6 g/dL (ref 30.0–36.0)
MCV: 91.3 fL (ref 80.0–100.0)
Platelets: 205 10*3/uL (ref 150–400)
RBC: 4.13 MIL/uL (ref 3.87–5.11)
RDW: 12.4 % (ref 11.5–15.5)
WBC: 3.7 10*3/uL — ABNORMAL LOW (ref 4.0–10.5)
nRBC: 0 % (ref 0.0–0.2)

## 2023-06-04 LAB — COMPREHENSIVE METABOLIC PANEL
ALT: 16 U/L (ref 0–44)
AST: 21 U/L (ref 15–41)
Albumin: 5 g/dL (ref 3.5–5.0)
Alkaline Phosphatase: 43 U/L (ref 38–126)
Anion gap: 9 (ref 5–15)
BUN: 30 mg/dL — ABNORMAL HIGH (ref 8–23)
CO2: 24 mmol/L (ref 22–32)
Calcium: 9.9 mg/dL (ref 8.9–10.3)
Chloride: 102 mmol/L (ref 98–111)
Creatinine, Ser: 0.89 mg/dL (ref 0.44–1.00)
GFR, Estimated: >60 mL/min (ref >60)
Glucose, Bld: 124 mg/dL — ABNORMAL HIGH (ref 70–99)
Potassium: 3.9 mmol/L (ref 3.5–5.1)
Sodium: 135 mmol/L (ref 135–145)
Total Bilirubin: 0.5 mg/dL (ref 0.0–1.2)
Total Protein: 7.8 g/dL (ref 6.5–8.1)

## 2023-06-04 LAB — LIPASE, BLOOD: Lipase: 27 U/L (ref 11–51)

## 2023-06-04 MED ORDER — ONDANSETRON HCL 4 MG/2ML IJ SOLN
4.0000 mg | Freq: Once | INTRAMUSCULAR | Status: AC
  Administered 2023-06-04: 4 mg via INTRAVENOUS
  Filled 2023-06-04: qty 2

## 2023-06-04 MED ORDER — LOPERAMIDE HCL 2 MG PO CAPS
4.0000 mg | ORAL_CAPSULE | Freq: Once | ORAL | Status: AC
  Administered 2023-06-04: 4 mg via ORAL
  Filled 2023-06-04: qty 2

## 2023-06-04 MED ORDER — ONDANSETRON 4 MG PO TBDP
4.0000 mg | ORAL_TABLET | Freq: Three times a day (TID) | ORAL | 0 refills | Status: DC | PRN
  Filled 2023-06-04: qty 4, 2d supply, fill #0

## 2023-06-04 MED ORDER — SODIUM CHLORIDE 0.9 % IV BOLUS
1000.0000 mL | Freq: Once | INTRAVENOUS | Status: AC
  Administered 2023-06-04: 1000 mL via INTRAVENOUS

## 2023-06-04 NOTE — ED Provider Notes (Signed)
 [place] EMERGENCY DEPARTMENT AT [facility] Provider Note   CSN: [id number] Arrival date & time: 06/04/23  0308     History  Chief Complaint  Patient presents with   Emesis   Diarrhea    Bonnie Sanders is a 78 y.o. female.  The history is provided by the patient.  Patient with history of hypertension presents with vomiting and diarrhea.  Patient reports she started having nonbloody diarrhea yesterday.  Earlier tonight she started having nausea and vomiting (nonbloody emesis) No abdominal pain.  No fevers.  She reports chills.  No recent travel, no recent antibiotics    Past Medical History:  Diagnosis Date   Allergic rhinitis    Allergic rhinitis due to pollen 11/30/2010   Depression    Dyslipidemia    Fecal incontinence 01/28/2017   Formatting of this note might be different from the original. Normal rectal exam.  Episodes are likely due to the nighttime amitriptyline   GERD (gastroesophageal reflux disease)    Heme positive stool    Hip pain, left 11/17/2013   Hyperlipidemia    Hypertension    Hypothyroid    Muscle cramping 10/10/2021   Osteoporosis    Other screening mammogram    Pain in both lower extremities 10/10/2021   Primary osteoarthritis of left knee 07/25/2015    Home Medications Prior to Admission medications   Medication Sig Start Date End Date Taking? Authorizing Provider  ondansetron (ZOFRAN-ODT) 4 MG disintegrating tablet Take 1 tablet (4 mg total) by mouth every 8 (eight) hours as needed. 06/04/23  Yes Zadie Rhine, MD  amitriptyline (ELAVIL) 150 MG tablet Take 1 tablet (150 mg total) by mouth at bedtime. 04/29/23   [name], NP  B Complex Vitamins (VITAMIN B COMPLEX PO) Take by mouth.    [provider]  fenofibrate 54 MG tablet Take 1 tablet (54 mg total) by mouth daily. 04/29/23   [name], NP  levothyroxine (SYNTHROID) 75 MCG tablet Take 1 tablet (75 mcg total) by mouth every morning. 04/29/23   Early, Sung Amabile, NP   lisinopril (ZESTRIL) 10 MG tablet Take 1 tablet (10 mg total) by mouth daily. 04/29/23   [name], NP  montelukast (SINGULAIR) 10 MG tablet Take 1 tablet (10 mg total) by mouth at bedtime. 05/13/23   [name], NP  omeprazole (PRILOSEC) 20 MG capsule Take 1 capsule (20 mg total) by mouth daily. 04/29/23   [name], NP  rosuvastatin (CRESTOR) 5 MG tablet Take 1 tablet (5 mg total) by mouth at bedtime. 04/29/23   [name], NP  tretinoin (RETIN-A) 0.05 % cream Apply a pea sized amount spreading evenly in a thin layer to face at bedtime. Avoid use close to the eyes and mouth. Wash hands thoroughly after applying. Avoid sunlight exposure. 04/29/23   [name], NP      Allergies    Codeine and Statins    Review of Systems   Review of Systems  Constitutional:  Positive for chills. Negative for fever.  Gastrointestinal:  Positive for diarrhea, nausea and vomiting. Negative for abdominal pain and blood in stool.    Physical Exam Updated Vital Signs BP 117/82   Pulse 82   Temp 97.9 F (36.6 C) (Oral)   Resp 18   SpO2 100%  Physical Exam CONSTITUTIONAL: Elderly and frail HEAD: Normocephalic/atraumatic EYES: EOMI/PERRL, no icterus ENMT: Mucous membranes dry NECK: supple no meningeal signs CV: S1/S2 noted, no murmurs/rubs/gallops noted LUNGS:  Lungs are clear to auscultation bilaterally, no apparent distress ABDOMEN: soft, nontender, no rebound or guarding, bowel sounds noted throughout abdomen NEURO: Pt is awake/alert/appropriate, moves all extremitiesx4.  No facial droop.   EXTREMITIES: full ROM SKIN: warm, color normal PSYCH: no abnormalities of mood noted, alert and oriented to situation  ED Results / Procedures / Treatments   Labs (all labs ordered are listed, but only abnormal results are displayed) Labs Reviewed  COMPREHENSIVE METABOLIC PANEL - Abnormal; Notable for the following components:      Result Value   Glucose, Bld 124 (*)    BUN 30 (*)    All other  components within normal limits  CBC - Abnormal; Notable for the following components:   WBC 3.7 (*)    All other components within normal limits  LIPASE, BLOOD    EKG None  Radiology No results found.  Procedures Procedures    Medications Ordered in ED Medications  ondansetron (ZOFRAN) injection 4 mg (4 mg Intravenous Given 06/04/23 0509)  sodium chloride 0.9 % bolus 1,000 mL (0 mLs Intravenous Stopped 06/04/23 0559)  loperamide (IMODIUM) capsule 4 mg (4 mg Oral Given 06/04/23 0559)    ED Course/ Medical Decision Making/ A&P Clinical Course as of 06/04/23 0633  Wed Jun 04, 2023  0517 WBC(!): 3.7 Mild leukopenia [DW]  0517 BUN(!): 30 Mild dehydration [DW]  0517 Patient is overall well-appearing.  She is in no acute distress, no focal abdominal tenderness.  Will rehydrate and reassess [DW]  [phone] Patient had a large bout of diarrhea but is now feeling improved.  No vomiting.  She is requesting discharge home. We discussed appropriate use of Imodium She is also open to using Zofran as needed for nausea, short course of 4 mg Zofran provided [DW]    Clinical Course User Index [DW] Zadie Rhine, MD                                 Medical Decision Making Amount and/or Complexity of Data Reviewed Labs: ordered. Decision-making details documented in ED Course.  Risk Prescription drug management.   This patient presents to the ED for concern of vomiting and diarrhea, this involves an extensive number of treatment options, and is a complaint that carries with it a high risk of complications and morbidity.  The differential diagnosis includes but is not limited to viral gastroenteritis, appendicitis, diverticulitis, colitis  Comorbidities that complicate the patient evaluation: Patient's presentation is complicated by their history of hypertension   Additional history obtained: Additional history obtained from family Records reviewed Primary Care Documents  Lab  Tests: I Ordered, and personally interpreted labs.  The pertinent results include: Dehydration, mild leukopenia   Medicines ordered and prescription drug management: I ordered medication including Zofran for nausea Reevaluation of the patient after these medicines showed that the patient    improved   Reevaluation: After the interventions noted above, I reevaluated the patient and found that they have :improved  Complexity of problems addressed: Patient's presentation is most consistent with  acute presentation with potential threat to life or bodily function  Disposition: After consideration of the diagnostic results and the patient's response to treatment,  I feel that the patent would benefit from discharge   .   Patient feeling much improved, sitting up on edge of the bed in no acute distress.  I have low suspicion for acute abdominal emergency at this time  Final Clinical Impression(s) / ED Diagnoses Final diagnoses:  Nausea vomiting and diarrhea    Rx / DC Orders ED Discharge Orders          Ordered    ondansetron (ZOFRAN-ODT) 4 MG disintegrating tablet  Every 8 hours PRN        06/04/23 0631              Zadie Rhine, MD 06/04/23 [phone]

## 2023-06-04 NOTE — Discharge Instructions (Signed)
   SEEK IMMEDIATE MEDICAL ATTENTION IF: The pain does not go away or becomes severe, particularly over the next 8-12 hours.  A temperature above 100.2F develops.  Repeated vomiting occurs (multiple episodes).   Blood is being passed in stools or vomit (bright red or black tarry stools).  Return also if you develop chest pain, difficulty breathing, dizziness or fainting, or become confused, poorly responsive, or inconsolable.

## 2023-06-04 NOTE — ED Notes (Signed)
 Pt had bout of diarrhea in her gown - given brief, scrubs and wipes to help clean.

## 2023-06-04 NOTE — ED Triage Notes (Signed)
 Pt presents via POV c/o diarrhea since yesterday and vomiting started today.   Denies abd pain. Ambulatory to triage.

## 2023-07-21 ENCOUNTER — Other Ambulatory Visit (HOSPITAL_BASED_OUTPATIENT_CLINIC_OR_DEPARTMENT_OTHER): Payer: Self-pay

## 2023-08-19 ENCOUNTER — Other Ambulatory Visit (HOSPITAL_BASED_OUTPATIENT_CLINIC_OR_DEPARTMENT_OTHER): Payer: Self-pay

## 2023-08-20 ENCOUNTER — Other Ambulatory Visit (HOSPITAL_BASED_OUTPATIENT_CLINIC_OR_DEPARTMENT_OTHER): Payer: Self-pay

## 2023-09-11 ENCOUNTER — Other Ambulatory Visit (HOSPITAL_BASED_OUTPATIENT_CLINIC_OR_DEPARTMENT_OTHER): Payer: Self-pay

## 2023-09-11 DIAGNOSIS — Z96651 Presence of right artificial knee joint: Secondary | ICD-10-CM | POA: Diagnosis not present

## 2023-09-11 MED ORDER — METHYLPREDNISOLONE 4 MG PO TBPK
ORAL_TABLET | ORAL | 0 refills | Status: DC
  Filled 2023-09-11: qty 21, 6d supply, fill #0

## 2023-09-16 ENCOUNTER — Other Ambulatory Visit (HOSPITAL_BASED_OUTPATIENT_CLINIC_OR_DEPARTMENT_OTHER): Payer: Self-pay

## 2023-09-16 ENCOUNTER — Other Ambulatory Visit: Payer: Self-pay

## 2023-09-23 ENCOUNTER — Ambulatory Visit: Payer: Medicare Other

## 2023-09-23 DIAGNOSIS — Z0000 Encounter for general adult medical examination without abnormal findings: Secondary | ICD-10-CM

## 2023-09-23 NOTE — Progress Notes (Signed)
 Subjective:   Bonnie Sanders is a 78 y.o. who presents for a Medicare Wellness preventive visit.  As a reminder, Annual Wellness Visits don't include a physical exam, and some assessments may be limited, especially if this visit is performed virtually. We may recommend an in-person follow-up visit with your provider if needed.  Visit Complete: Virtual I connected with  Bonnie Sanders on 09/23/23 by a audio enabled telemedicine application and verified that I am speaking with the correct person using two identifiers.  Patient Location: Home  Provider Location: Office/Clinic  I discussed the limitations of evaluation and management by telemedicine. The patient expressed understanding and agreed to proceed.  Vital Signs: Because this visit was a virtual/telehealth visit, some criteria may be missing or patient reported. Any vitals not documented were not able to be obtained and vitals that have been documented are patient reported.  VideoError- Librarian, academic were attempted between this provider and patient, however failed, due to patient having technical difficulties OR patient did not have access to video capability.  We continued and completed visit with audio only.   Persons Participating in Visit: Patient.  AWV Questionnaire: No: Patient Medicare AWV questionnaire was not completed prior to this visit.  Cardiac Risk Factors include: advanced age (>61men, >40 women);dyslipidemia;hypertension     Objective:    Today's Vitals   There is no height or weight on file to calculate BMI.     09/23/2023    3:36 PM 06/04/2023    3:21 AM 09/17/2022    3:32 PM 08/23/2022    3:06 PM 08/22/2021    8:27 PM 06/25/2021    1:50 PM  Advanced Directives  Does Patient Have a Medical Advance Directive? Yes No No No No No  Type of Estate agent of [place];Living will       Does patient want to make changes to medical advance directive?     Yes  (MAU/Ambulatory/Procedural Areas - Information given)   Copy of Healthcare Power of Attorney in Chart? No - copy requested       Would patient like information on creating a medical advance directive?     Yes (MAU/Ambulatory/Procedural Areas - Information given) No - Patient declined    Current Medications (verified) Outpatient Encounter Medications as of 09/23/2023  Medication Sig   amitriptyline  (ELAVIL ) 150 MG tablet Take 1 tablet (150 mg total) by mouth at bedtime.   B Complex Vitamins (VITAMIN B COMPLEX PO) Take by mouth.   fenofibrate  54 MG tablet Take 1 tablet (54 mg total) by mouth daily.   levothyroxine  (SYNTHROID ) 75 MCG tablet Take 1 tablet (75 mcg total) by mouth every morning.   lisinopril  (ZESTRIL ) 10 MG tablet Take 1 tablet (10 mg total) by mouth daily.   omeprazole  (PRILOSEC) 20 MG capsule Take 1 capsule (20 mg total) by mouth daily.   rosuvastatin  (CRESTOR ) 5 MG tablet Take 1 tablet (5 mg total) by mouth at bedtime.   tretinoin  (RETIN-A ) 0.05 % cream Apply a pea sized amount spreading evenly in a thin layer to face at bedtime. Avoid use close to the eyes and mouth. Wash hands thoroughly after applying. Avoid sunlight exposure.   methylPREDNISolone  (MEDROL  DOSEPAK) 4 MG TBPK tablet Take As Directed On Package (Patient not taking: Reported on 09/23/2023)   montelukast  (SINGULAIR ) 10 MG tablet Take 1 tablet (10 mg total) by mouth at bedtime. (Patient not taking: Reported on 09/23/2023)   ondansetron  (ZOFRAN -ODT) 4 MG disintegrating tablet Take  1 tablet (4 mg total) by mouth every 8 (eight) hours as needed. (Patient not taking: Reported on 09/23/2023)   No facility-administered encounter medications on file as of 09/23/2023.    Allergies (verified) Codeine and Statins   History: Past Medical History:  Diagnosis Date   Allergic rhinitis    Allergic rhinitis due to pollen 11/30/2010   Depression    Dyslipidemia    Fecal incontinence 01/28/2017   Formatting of this note might be  different from the original. Normal rectal exam.  Episodes are likely due to the nighttime amitriptyline    GERD (gastroesophageal reflux disease)    Heme positive stool    Hip pain, left 11/17/2013   Hyperlipidemia    Hypertension    Hypothyroid    Muscle cramping 10/10/2021   Osteoporosis    Other screening mammogram    Pain in both lower extremities 10/10/2021   Primary osteoarthritis of left knee 07/25/2015   Past Surgical History:  Procedure Laterality Date   ABDOMINAL HYSTERECTOMY     COLONOSCOPY  2009   FOOT SURGERY     TOTAL KNEE ARTHROPLASTY     right foot   Family History  Problem Relation Age of Onset   Hypertension Mother    Arthritis Mother    Hypertension Father    Heart disease Father    Hyperlipidemia Father    Arthritis Father    Hypertension Maternal Grandmother    Stroke Maternal Grandmother    Heart disease Maternal Grandmother    Arthritis Maternal Grandmother    Hypertension Maternal Grandfather    Arthritis Maternal Grandfather    Hypertension Paternal Grandmother    Arthritis Paternal Grandmother    Hypertension Paternal Grandfather    Arthritis Paternal Grandfather    Social History   Socioeconomic History   Marital status: Married    Spouse name: Not on file   Number of children: Not on file   Years of education: Not on file   Highest education level: Not on file  Occupational History   Not on file  Tobacco Use   Smoking status: Never   Smokeless tobacco: Never  Vaping Use   Vaping status: Never Used  Substance and Sexual Activity   Alcohol  use: No   Drug use: No   Sexual activity: Not Currently  Other Topics Concern   Not on file  Social History Narrative   Not on file   Social Drivers of Health   Financial Resource Strain: Low Risk  (09/23/2023)   Overall Financial Resource Strain (CARDIA)    Difficulty of Paying Living Expenses: Not hard at all  Food Insecurity: No Food Insecurity (09/23/2023)   Hunger Vital Sign    Worried  About Running Out of Food in the Last Year: Never true    Ran Out of Food in the Last Year: Never true  Transportation Needs: No Transportation Needs (09/23/2023)   PRAPARE - Administrator, Civil Service (Medical): No    Lack of Transportation (Non-Medical): No  Physical Activity: Sufficiently Active (09/23/2023)   Exercise Vital Sign    Days of Exercise per Week: 7 days    Minutes of Exercise per Session: 60 min  Stress: No Stress Concern Present (09/23/2023)   [employer] of Occupational Health - Occupational Stress Questionnaire    Feeling of Stress: Not at all  Social Connections: Socially Integrated (09/23/2023)   Social Connection and Isolation Panel    Frequency of Communication with Friends and Family: More than three  times a week    Frequency of Social Gatherings with Friends and Family: More than three times a week    Attends Religious Services: More than 4 times per year    Active Member of [employer] or Organizations: Yes    Attends Engineer, structural: More than 4 times per year    Marital Status: Married    Tobacco Counseling Counseling given: Not Answered    Clinical Intake:  Pre-visit preparation completed: Yes  Pain : No/denies pain     Nutritional Risks: None Diabetes: No  Lab Results  Component Value Date   HGBA1C 6.1 (H) 04/29/2023   HGBA1C 5.9 (H) 08/19/2022   HGBA1C 5.9 (H) 02/18/2022     How often do you need to have someone help you when you read instructions, pamphlets, or other written materials from your doctor or pharmacy?: 1 - Never  Interpreter Needed?: No  Information entered by :: NAllen LPN   Activities of Daily Living     09/23/2023    3:26 PM  In your present state of health, do you have any difficulty performing the following activities:  Hearing? 1  Comment has hearing aids  Vision? 0  Difficulty concentrating or making decisions? 1  Walking or climbing stairs? 0  Dressing or bathing? 0  Doing  errands, shopping? 0  Preparing Food and eating ? N  Using the Toilet? N  In the past six months, have you accidently leaked urine? Y  Do you have problems with loss of bowel control? N  Managing your Medications? N  Managing your Finances? N  Housekeeping or managing your Housekeeping? N    Patient Care Team: Early, Sara E, NP as PCP - General (Nurse Practitioner)  I have updated your Care Teams any recent Medical Services you may have received from other providers in the past year.     Assessment:   This is a routine wellness examination for Zayneb.  Hearing/Vision screen Hearing Screening - Comments:: Has hearing aids that are maintained Vision Screening - Comments:: Regular eye exams, [facility]   Goals Addressed             This Visit's Progress    Patient Stated       09/23/2023, wants to eat in moderation       Depression Screen     09/23/2023    3:41 PM 04/29/2023    9:55 AM 09/17/2022    3:35 PM 08/19/2022    8:08 AM 02/18/2022    8:34 AM 10/16/2021   10:56 AM 10/10/2021    8:28 AM  PHQ 2/9 Scores  PHQ - 2 Score 1 0 0 0 0 0 0  PHQ- 9 Score 1    1 0   Exception Documentation      Medical reason     Fall Risk     09/23/2023    3:39 PM 04/29/2023    9:54 AM 09/17/2022    3:33 PM 08/19/2022    8:07 AM 02/18/2022    8:37 AM  Fall Risk   Falls in the past year? 1 1 1  0 1  Comment tripped walking  tripped    Number falls in past yr: 0 0 0 0 0  Injury with Fall? 0 0 0 0 0  Risk for fall due to : Medication side effect No Fall Risks Medication side effect No Fall Risks   Follow up Falls evaluation completed;Falls prevention discussed Falls evaluation completed Falls prevention discussed;Education provided;Falls  evaluation completed Falls evaluation completed     MEDICARE RISK AT HOME:  Medicare Risk at Home Any stairs in or around the home?: Yes If so, are there any without handrails?: No Home free of loose throw rugs in walkways, pet beds, electrical  cords, etc?: Yes Adequate lighting in your home to reduce risk of falls?: Yes Life alert?: No Use of a cane, walker or w/c?: No Grab bars in the bathroom?: Yes Shower chair or bench in shower?: No Elevated toilet seat or a handicapped toilet?: Yes  TIMED UP AND GO:  Was the test performed?  No  Cognitive Function: 6CIT completed        09/23/2023    3:42 PM 09/17/2022    3:37 PM 08/22/2021    8:28 PM  6CIT Screen  What Year? 0 points 0 points 0 points  What month? 0 points 0 points 0 points  What time? 0 points 0 points 0 points  Count back from 20 0 points 0 points 0 points  Months in reverse 0 points 0 points 0 points  Repeat phrase 0 points 0 points 0 points  Total Score 0 points 0 points 0 points    Immunizations Immunization History  Administered Date(s) Administered   Fluad Quad(high Dose 65+) 01/07/2023   Influenza Whole 12/03/2010   Influenza, High Dose Seasonal PF 12/26/2012, 12/12/2016   Influenza, Quadrivalent, Recombinant, Inj, Pf 12/07/2021   Influenza-Unspecified 12/07/2012, 12/07/2017, 12/07/2020   MMR 04/19/2020   PFIZER(Purple Top)SARS-COV-2 Vaccination 06/10/2019, 07/07/2019   PNEUMOCOCCAL CONJUGATE-20 04/29/2023   Pfizer Covid-19 Vaccine Bivalent Booster 46yrs & up 09/05/2021   Pfizer(Comirnaty )Fall Seasonal Vaccine 12 years and older 07/08/2022   Pneumococcal Polysaccharide-23 06/02/2012   Respiratory Syncytial Virus Vaccine ,Recomb Aduvanted(Arexvy ) 12/13/2021   Tdap 12/03/2010, 04/09/2011, 08/20/2022   Unspecified SARS-COV-2 Vaccination 12/15/2022   Zoster Recombinant(Shingrix) 05/11/2018, 10/17/2018   Zoster, Live 04/08/2008, 03/06/2010    Screening Tests Health Maintenance  Topic Date Due   COVID-19 Vaccine (6 - 2024-25 season) 06/14/2023   INFLUENZA VACCINE  11/07/2023   Medicare Annual Wellness (AWV)  09/22/2024   DTaP/Tdap/Td (4 - Td or Tdap) 08/19/2032   Pneumococcal Vaccine: 50+ Years  Completed   DEXA SCAN  Completed   Zoster  Vaccines- Shingrix  Completed   HPV VACCINES  Aged Out   Meningococcal B Vaccine  Aged Out   Colonoscopy  Discontinued   Hepatitis C Screening  Discontinued    Health Maintenance  Health Maintenance Due  Topic Date Due   COVID-19 Vaccine (6 - 2024-25 season) 06/14/2023   Health Maintenance Items Addressed: Up to date  Additional Screening:  Vision Screening: Recommended annual ophthalmology exams for early detection of glaucoma and other disorders of the eye. Would you like a referral to an eye doctor? No    Dental Screening: Recommended annual dental exams for proper oral hygiene  Community Resource Referral / Chronic Care Management: CRR required this visit?  No   CCM required this visit?  No   Plan:    I have personally reviewed and noted the following in the patient's chart:   Medical and social history Use of alcohol , tobacco or illicit drugs  Current medications and supplements including opioid prescriptions. Patient is not currently taking opioid prescriptions. Functional ability and status Nutritional status Physical activity Advanced directives List of other physicians Hospitalizations, surgeries, and ER visits in previous 12 months Vitals Screenings to include cognitive, depression, and falls Referrals and appointments  In addition, I have reviewed and discussed with  patient certain preventive protocols, quality metrics, and best practice recommendations. A written personalized care plan for preventive services as well as general preventive health recommendations were provided to patient.   Areatha Beecham, LPN   1/61/0960   After Visit Summary: (MyChart) Due to this being a telephonic visit, the after visit summary with patients personalized plan was offered to patient via MyChart   Notes: Nothing significant to report at this time.

## 2023-09-23 NOTE — Patient Instructions (Signed)
 Ms. Bonnie Sanders , Thank you for taking time out of your busy schedule to complete your Annual Wellness Visit with me. I enjoyed our conversation and look forward to speaking with you again next year. I, as well as your care team,  appreciate your ongoing commitment to your health goals. Please review the following plan we discussed and let me know if I can assist you in the future. Your Game plan/ To Do List    Referrals: If you haven't heard from the office you've been referred to, please reach out to them at the phone provided.  N/a Follow up Visits: Next Medicare AWV with our clinical staff: 09/28/2024 at 3:30   Have you seen your provider in the last 6 months (3 months if uncontrolled diabetes)? Yes Next Office Visit with your provider: 05/06/2024 at 8:30  Clinician Recommendations:  Aim for 30 minutes of exercise or brisk walking, 6-8 glasses of water, and 5 servings of fruits and vegetables each day.       This is a list of the screening recommended for you and due dates:  Health Maintenance  Topic Date Due   COVID-19 Vaccine (6 - 2024-25 season) 06/14/2023   Flu Shot  11/07/2023   Medicare Annual Wellness Visit  09/22/2024   DTaP/Tdap/Td vaccine (4 - Td or Tdap) 08/19/2032   Pneumococcal Vaccine for age over 47  Completed   DEXA scan (bone density measurement)  Completed   Zoster (Shingles) Vaccine  Completed   HPV Vaccine  Aged Out   Meningitis B Vaccine  Aged Out   Colon Cancer Screening  Discontinued   Hepatitis C Screening  Discontinued    Advanced directives: (Copy Requested) Please bring a copy of your health care power of attorney and living will to the office to be added to your chart at your convenience. You can mail to [facility] [address] [place], [place] [address] or email to [email or web address] Advance Care Planning is important because it:  [x]  Makes sure you receive the medical care that is consistent with your values, goals, and  preferences  [x]  It provides guidance to your family and loved ones and reduces their decisional burden about whether or not they are making the right decisions based on your wishes.  Follow the link provided in your after visit summary or read over the paperwork we have mailed to you to help you started getting your Advance Directives in place. If you need assistance in completing these, please reach out to us  so that we can help you!  See attachments for Preventive Care and Fall Prevention Tips.

## 2023-10-08 DIAGNOSIS — H524 Presbyopia: Secondary | ICD-10-CM | POA: Diagnosis not present

## 2023-10-16 ENCOUNTER — Other Ambulatory Visit (HOSPITAL_BASED_OUTPATIENT_CLINIC_OR_DEPARTMENT_OTHER): Payer: Self-pay

## 2023-10-16 ENCOUNTER — Other Ambulatory Visit (HOSPITAL_COMMUNITY): Payer: Self-pay

## 2023-10-18 ENCOUNTER — Other Ambulatory Visit (HOSPITAL_BASED_OUTPATIENT_CLINIC_OR_DEPARTMENT_OTHER): Payer: Self-pay

## 2023-10-21 ENCOUNTER — Encounter: Payer: Self-pay | Admitting: Medical

## 2023-10-21 ENCOUNTER — Ambulatory Visit (INDEPENDENT_AMBULATORY_CARE_PROVIDER_SITE_OTHER): Admitting: Medical

## 2023-10-21 VITALS — BP 110/60 | HR 76 | Ht 59.0 in | Wt 121.2 lb

## 2023-10-21 DIAGNOSIS — H9193 Unspecified hearing loss, bilateral: Secondary | ICD-10-CM

## 2023-10-21 DIAGNOSIS — E538 Deficiency of other specified B group vitamins: Secondary | ICD-10-CM

## 2023-10-21 DIAGNOSIS — H6123 Impacted cerumen, bilateral: Secondary | ICD-10-CM

## 2023-10-21 MED ORDER — CYANOCOBALAMIN 1000 MCG/ML IJ SOLN
1000.0000 ug | Freq: Once | INTRAMUSCULAR | Status: AC
  Administered 2023-10-21: 1000 ug via INTRAMUSCULAR

## 2023-10-21 NOTE — Progress Notes (Signed)
 Subjective:   Here for complaint of decreased hearing.  She is concerned for earwax buildup in both ears.  Symptoms include ear pressure, decreased hearing. She was supposed to get new hearing aids but audiology said she had too much wax, advised to come here for cerumen removal.  Also here for b12 injection.  She had labs here recently and PCP advised of option of injection.  She has had B12 injection once prior.    Past Medical History:  Diagnosis Date   Allergic rhinitis    Allergic rhinitis due to pollen 11/30/2010   Depression    Dyslipidemia    Fecal incontinence 01/28/2017   Formatting of this note might be different from the original. Normal rectal exam.  Episodes are likely due to the nighttime amitriptyline    GERD (gastroesophageal reflux disease)    Heme positive stool    Hip pain, left 11/17/2013   Hyperlipidemia    Hypertension    Hypothyroid    Muscle cramping 10/10/2021   Osteoporosis    Other screening mammogram    Pain in both lower extremities 10/10/2021   Primary osteoarthritis of left knee 07/25/2015   Current Outpatient Medications on File Prior to Visit  Medication Sig Dispense Refill   amitriptyline  (ELAVIL ) 150 MG tablet Take 1 tablet (150 mg total) by mouth at bedtime. 90 tablet 3   fenofibrate  54 MG tablet Take 1 tablet (54 mg total) by mouth daily. 90 tablet 3   levothyroxine  (SYNTHROID ) 75 MCG tablet Take 1 tablet (75 mcg total) by mouth every morning. 90 tablet 3   lisinopril  (ZESTRIL ) 10 MG tablet Take 1 tablet (10 mg total) by mouth daily. 90 tablet 3   omeprazole  (PRILOSEC) 20 MG capsule Take 1 capsule (20 mg total) by mouth daily. 90 capsule 3   rosuvastatin  (CRESTOR ) 5 MG tablet Take 1 tablet (5 mg total) by mouth at bedtime. 90 tablet 3   tretinoin  (RETIN-A ) 0.05 % cream Apply a pea sized amount spreading evenly in a thin layer to face at bedtime. Avoid use close to the eyes and mouth. Wash hands thoroughly after applying. Avoid sunlight exposure. 40 g 1    No current facility-administered medications on file prior to visit.    Review of Systems Constitutional: denies fever, chills, sweats ENT: no runny nose, ear pain, sore throat, hoarseness, sinus pain, teeth pain, tinnitus, hearing loss Gastroenterology: denies nausea, vomiting     Objective:   Physical Exam  General appearance: alert, no distress, WD/WN Ears: bilat ear canal with impacted cerumen. HENT: conjunctiva/corneas normal, sclerae anicteric, nares patent, no discharge or erythema, pharynx normal Oral cavity: MMM, tongue normal, teeth normal Neurological: Hearing normal bilaterally to whisper    Assessment & Plan:    Encounter Diagnoses  Name Primary?   Decreased hearing of both ears Yes   Impacted cerumen of both ears    B12 deficiency      Discussed findings.  Discussed risk/benefits of procedure and patient agrees to procedure. Successfully used warm water lavage to remove impacted cerumen from bilat ear canal. Patient tolerated procedure well. Advised they avoid using any cotton swabs or other devices to clean the ear canals.  Use basic hygiene as discussed.    B12 deficiency - B12 injection given today.  Can return in 1 month for next injection.  Follow up with PCP  Follow up 30mo

## 2023-10-21 NOTE — Addendum Note (Signed)
 Addended by: CLAUDENE COUNTRYMAN F on: 10/21/2023 11:11 AM   Modules accepted: Orders

## 2023-10-23 ENCOUNTER — Other Ambulatory Visit (HOSPITAL_BASED_OUTPATIENT_CLINIC_OR_DEPARTMENT_OTHER): Payer: Self-pay

## 2023-11-18 ENCOUNTER — Other Ambulatory Visit

## 2023-12-16 ENCOUNTER — Other Ambulatory Visit (HOSPITAL_BASED_OUTPATIENT_CLINIC_OR_DEPARTMENT_OTHER): Payer: Self-pay

## 2024-01-26 ENCOUNTER — Other Ambulatory Visit (HOSPITAL_BASED_OUTPATIENT_CLINIC_OR_DEPARTMENT_OTHER): Payer: Self-pay

## 2024-01-27 ENCOUNTER — Other Ambulatory Visit (HOSPITAL_BASED_OUTPATIENT_CLINIC_OR_DEPARTMENT_OTHER): Payer: Self-pay

## 2024-01-29 ENCOUNTER — Other Ambulatory Visit (HOSPITAL_BASED_OUTPATIENT_CLINIC_OR_DEPARTMENT_OTHER): Payer: Self-pay

## 2024-01-31 ENCOUNTER — Other Ambulatory Visit (HOSPITAL_BASED_OUTPATIENT_CLINIC_OR_DEPARTMENT_OTHER): Payer: Self-pay

## 2024-03-01 ENCOUNTER — Other Ambulatory Visit (HOSPITAL_BASED_OUTPATIENT_CLINIC_OR_DEPARTMENT_OTHER): Payer: Self-pay

## 2024-03-08 ENCOUNTER — Ambulatory Visit: Payer: Self-pay

## 2024-03-08 ENCOUNTER — Ambulatory Visit: Admitting: Family Medicine

## 2024-03-08 ENCOUNTER — Encounter: Payer: Self-pay | Admitting: Family Medicine

## 2024-03-08 ENCOUNTER — Other Ambulatory Visit (HOSPITAL_BASED_OUTPATIENT_CLINIC_OR_DEPARTMENT_OTHER): Payer: Self-pay

## 2024-03-08 VITALS — BP 124/72 | HR 88 | Wt 120.8 lb

## 2024-03-08 DIAGNOSIS — M25512 Pain in left shoulder: Secondary | ICD-10-CM | POA: Diagnosis not present

## 2024-03-08 DIAGNOSIS — K219 Gastro-esophageal reflux disease without esophagitis: Secondary | ICD-10-CM

## 2024-03-08 MED ORDER — FAMOTIDINE 20 MG PO TABS
20.0000 mg | ORAL_TABLET | Freq: Two times a day (BID) | ORAL | 0 refills | Status: AC | PRN
  Filled 2024-03-08: qty 60, 30d supply, fill #0

## 2024-03-08 MED ORDER — MELOXICAM 15 MG PO TABS
15.0000 mg | ORAL_TABLET | Freq: Every day | ORAL | 0 refills | Status: DC
  Filled 2024-03-08: qty 30, 30d supply, fill #0

## 2024-03-08 NOTE — Telephone Encounter (Signed)
 FYI Only or Action Required?: FYI only for provider: appointment scheduled on 12/1.  Patient was last seen in primary care on 10/21/2023 by Bulah Alm RAMAN, PA-C.  Called Nurse Triage reporting Arm Pain.  Symptoms began several weeks ago.  Interventions attempted: Rest, hydration, or home remedies.  Symptoms are: unchanged.  Triage Disposition: See PCP When Office is Open (Within 3 Days)  Patient/caregiver understands and will follow disposition?: Yes  Copied from CRM #[id number]. Topic: Clinical - Red Word Triage >> Mar 08, 2024 12:46 PM Jasmin G wrote: Red Word that prompted transfer to Nurse Triage: Pain on upper left arm for a few weeks that doesn't go away, pt thinks she could've pulled a muscle. >> Mar 08, 2024  1:12 PM Roselie C wrote: Patient returning call to NT. Patient states she was disconnected, I do show she called originally for   Pain on upper left arm for a few weeks that doesn't go away, pt thinks she could've pulled a muscle.      Reason for Disposition  [1] MODERATE pain (e.g., interferes with normal activities) AND [2] present > 3 days  Answer Assessment - Initial Assessment Questions 6-8 weeks ago- patient was leaf blowing her back yard and turned her arm the wrong way. Arm pain since. Worse with use. Thanksgiving cooking made it worse. States feels muscular. Denies numbness, tingling, swelling or color change. Denies SOB, Dizziness.  Pt states a couple weeks ago she had chest pressure/tightness that she took TUMS for and resolved. Increased pt anxiety mentioning that it would be concern for cardiac. Took BP on the phone with RN- BP-163/84- typically SBP 130-140's- on lisinopril - last dose at bedtime. Denies headaches, Dizziness, SOB, or current CP.  Appt made with PCP office 1. ONSET: When did the pain start?     6-8weeks ago  2. LOCATION: Where is the pain located?     Top of left arm and now radiating to the under part of left arm  3. PAIN: How bad is  the pain? (Scale 0-10; or none, mild, moderate, severe)     Mild at rest and worse with use 4. WORK OR EXERCISE: Has there been any recent work or exercise that involved this part of the body?     Blowing leaves- turned her arm the wrong way  5. CAUSE: What do you think is causing the arm pain?     Stress injury from leaf blowing  6. OTHER SYMPTOMS: Do you have any other symptoms? (e.g., neck pain, swelling, rash, fever, numbness, weakness)    Chest pressure a few weeks ago  Protocols used: Arm Pain-A-AH

## 2024-03-08 NOTE — Progress Notes (Signed)
    Name: Bonnie Sanders   Date of Visit: 03/08/24   Date of last visit with me: Visit date not found   CHIEF COMPLAINT:  Chief Complaint  Patient presents with   Acute Visit    Was blowing leaves off porch and got pain going from shoulder down her arm. Has not gotten better only worse.        HPI:  Discussed the use of AI scribe software for clinical note transcription with the patient, who gave verbal consent to proceed.  History of Present Illness   Bonnie Sanders is a 78 year old female who presents with left arm pain.  She has been experiencing unusual pain in her left arm for approximately eight to nine weeks, which began while she was blowing leaves. The pain is described as 'odd' and not sharp, initially thought to improve over time, but it has persisted and worsened. It has since radiated down her arm, with discomfort now felt in the front of her arm as well.  No previous shoulder pain on the left side, but there is a history of right-sided muscle pain.  She has been managing the pain with Tylenol and occasionally Motrin. However, she has noticed that taking Tylenol sometimes leads to a sensation of pressure in her chest, which she associates with indigestion, and she has been taking Tums to relieve this pressure.  In her social history, she mentions having dogs and that walking them has been bothersome due to her arm pain.         OBJECTIVE:       09/23/2023    3:41 PM  Depression screen PHQ 2/9  Decreased Interest 0  Down, Depressed, Hopeless 1  PHQ - 2 Score 1  Altered sleeping 0  Tired, decreased energy 0  Change in appetite 0  Feeling bad or failure about yourself  0  Trouble concentrating 0  Moving slowly or fidgety/restless 0  Suicidal thoughts 0  PHQ-9 Score 1   Difficult doing work/chores Not difficult at all     Data saved with a previous flowsheet row definition     BP Readings from Last 3 Encounters:  03/08/24 124/72  10/21/23 110/60   06/04/23 117/82    BP 124/72   Pulse 88   Wt 120 lb 12.8 oz (54.8 kg)   SpO2 98%   BMI 24.40 kg/m    Physical Exam   MUSCULOSKELETAL: Pain on resisted shoulder abduction, weakness on resisted shoulder external rotation, pain on resisted shoulder flexion, pain on resisted shoulder internal rotation. Empty can negative, Hawkins positive     Physical Exam  ASSESSMENT/PLAN:   Assessment & Plan Acute pain of left shoulder  Gastroesophageal reflux disease without esophagitis    Assessment and Plan    Left shoulder rotator cuff with suspected biceps tendonitis  Persistent left shoulder pain with radiating discomfort, likely rotator cuff involvement. - Ordered x-rays of the left shoulder. - Provided shoulder rehabilitation exercises and band. - Prescribed meloxicam  for 10-14 days with food and water. - Scheduled follow-up in 3 weeks to assess progress and consider further interventions.  Gastroesophageal reflux disease Chest pressure likely due to medication-induced indigestion, relieved by antacids. - Advised taking medications with a full glass of water. - Prescribed Pepcid with medications to prevent indigestion.         Kevia Zaucha A. Vita MD [facility]

## 2024-03-13 ENCOUNTER — Other Ambulatory Visit (HOSPITAL_BASED_OUTPATIENT_CLINIC_OR_DEPARTMENT_OTHER): Payer: Self-pay

## 2024-03-18 ENCOUNTER — Other Ambulatory Visit (HOSPITAL_BASED_OUTPATIENT_CLINIC_OR_DEPARTMENT_OTHER): Payer: Self-pay

## 2024-03-22 ENCOUNTER — Ambulatory Visit
Admission: RE | Admit: 2024-03-22 | Discharge: 2024-03-22 | Disposition: A | Discharge status: Home / Self Care | Source: Ambulatory Visit | Attending: Family Medicine | Admitting: Family Medicine

## 2024-03-22 DIAGNOSIS — M25511 Pain in right shoulder: Secondary | ICD-10-CM | POA: Diagnosis not present

## 2024-03-22 DIAGNOSIS — M25512 Pain in left shoulder: Secondary | ICD-10-CM

## 2024-03-29 ENCOUNTER — Ambulatory Visit (INDEPENDENT_AMBULATORY_CARE_PROVIDER_SITE_OTHER): Admitting: Family Medicine

## 2024-03-29 ENCOUNTER — Encounter: Payer: Self-pay | Admitting: Family Medicine

## 2024-03-29 ENCOUNTER — Other Ambulatory Visit (INDEPENDENT_AMBULATORY_CARE_PROVIDER_SITE_OTHER): Payer: Self-pay

## 2024-03-29 VITALS — BP 112/70 | HR 68 | Wt 122.4 lb

## 2024-03-29 DIAGNOSIS — G8929 Other chronic pain: Secondary | ICD-10-CM | POA: Diagnosis not present

## 2024-03-29 DIAGNOSIS — M25512 Pain in left shoulder: Secondary | ICD-10-CM | POA: Diagnosis not present

## 2024-03-29 NOTE — Progress Notes (Signed)
 "  Name: Bonnie Sanders   Date of Visit: 03/29/2024   Date of last visit with me: 03/08/2024   CHIEF COMPLAINT:  Chief Complaint  Patient presents with   other    3 week f/u, lt. Arm hurting some,        HPI:  Discussed the use of AI scribe software for clinical note transcription with the patient, who gave verbal consent to proceed.  History of Present Illness   Bonnie Sanders is a 78 year old female with atrial fibrillation and congestive heart failure who presents with persistent shoulder pain.  She experiences persistent pain in the shoulder area, specifically below the shoulder joint. The pain is exacerbated by certain movements, such as throwing her arm back, and has not improved since her last visit. It significantly impacts her sleep, often waking her up at night, and limits her ability to perform certain exercises.  She has received injections in the past for back pain, administered under imaging guidance. Currently, she manages the condition with medication, taking one pill as needed, but notes that the effectiveness diminishes after a few days. The pain does not radiate into her hand, indicating it is localized to the shoulder area.         OBJECTIVE:       09/23/2023    3:41 PM  Depression screen PHQ 2/9  Decreased Interest 0  Down, Depressed, Hopeless 1  PHQ - 2 Score 1  Altered sleeping 0  Tired, decreased energy 0  Change in appetite 0  Feeling bad or failure about yourself  0  Trouble concentrating 0  Moving slowly or fidgety/restless 0  Suicidal thoughts 0  PHQ-9 Score 1   Difficult doing work/chores Not difficult at all     Data saved with a previous flowsheet row definition     BP Readings from Last 3 Encounters:  03/29/24 112/70  03/08/24 124/72  10/21/23 110/60    BP 112/70   Pulse 68   Wt 122 lb 6.4 oz (55.5 kg)   BMI 24.72 kg/m    Physical Exam          Physical Exam   Inspection reveals pain with abduction as well as  external rotation.  There is noted pain to tenderness to palpation over the biceps tendon in the bicipital groove.  Speed's Test is negative. Cross over negative  ASSESSMENT/PLAN:   Assessment & Plan Chronic left shoulder pain    Assessment and Plan    Left rotator cuff and biceps tendinopathy Chronic left shoulder pain likely due to rotator cuff and biceps tendinopathy. X-rays negative for bone abnormalities, indicating soft tissue involvement. Lack of improvement with exercises suggests possible injury requiring intervention. - Administered glenohumeral joint steroid injection under ultrasound guidance. - Advised rest for one day post-injection, then gradual activity increase as pain decreases. - Instructed to monitor pain levels and adjust activity accordingly.      Total time spent on the date of the encounter was 35 minutes, which included reviewing the patients chart, performing a history and physical exam, ordering and reviewing studies, coordinating care, and counseling the patient regarding diagnosis and treatment options. The time spent was medically necessary and supports billing based on total time.  US -guided glenohumeral joint injection, left shoulder After discussion on risks/benefits/indications, informed verbal consent was obtained. A timeout was then performed. The patient was positioned lying lateral recumbent on examination table. The patient's shoulder was prepped with betadine and multiple alcohol  swabs and utilizing  ultrasound guidance, the patient's glenohumeral joint was identified on ultrasound. Using ultrasound guidance a 22-gauge, 3.5 inch needle with a mixture of 2:2:2 cc's lidocaine:bupivicaine:depomedrol was directed from a lateral to medial direction via in-plane technique into the glenohumeral joint with visualization of appropriate spread of injectate into the joint. Patient tolerated the procedure well without immediate complications.        Karolyna Bianchini A. Vita  MD [facility] "

## 2024-04-14 ENCOUNTER — Other Ambulatory Visit: Payer: Self-pay

## 2024-04-14 ENCOUNTER — Other Ambulatory Visit: Payer: Self-pay | Admitting: Nurse Practitioner

## 2024-04-14 ENCOUNTER — Other Ambulatory Visit (HOSPITAL_BASED_OUTPATIENT_CLINIC_OR_DEPARTMENT_OTHER): Payer: Self-pay

## 2024-04-14 DIAGNOSIS — I10 Essential (primary) hypertension: Secondary | ICD-10-CM

## 2024-04-14 DIAGNOSIS — K2100 Gastro-esophageal reflux disease with esophagitis, without bleeding: Secondary | ICD-10-CM

## 2024-04-14 MED ORDER — FENOFIBRATE 54 MG PO TABS
54.0000 mg | ORAL_TABLET | Freq: Every day | ORAL | 0 refills | Status: DC
  Filled 2024-04-14 – 2024-04-29 (×4): qty 90, 90d supply, fill #0

## 2024-04-14 MED ORDER — OMEPRAZOLE 20 MG PO CPDR
20.0000 mg | DELAYED_RELEASE_CAPSULE | Freq: Every day | ORAL | 0 refills | Status: DC
  Filled 2024-04-14 – 2024-04-23 (×3): qty 90, 90d supply, fill #0

## 2024-04-15 ENCOUNTER — Other Ambulatory Visit: Payer: Self-pay

## 2024-04-21 ENCOUNTER — Other Ambulatory Visit: Payer: Self-pay | Admitting: Nurse Practitioner

## 2024-04-21 DIAGNOSIS — Z1231 Encounter for screening mammogram for malignant neoplasm of breast: Secondary | ICD-10-CM

## 2024-04-21 MED ORDER — BUPIVACAINE HCL 0.25 % IJ SOLN
2.0000 mL | Freq: Once | INTRAMUSCULAR | Status: AC
  Administered 2024-04-21: 2 mL

## 2024-04-21 MED ORDER — LIDOCAINE HCL 1 % IJ SOLN
5.0000 mL | Freq: Once | INTRAMUSCULAR | Status: AC
  Administered 2024-04-21: 5 mL via INTRADERMAL

## 2024-04-21 MED ORDER — METHYLPREDNISOLONE ACETATE 40 MG/ML IJ SUSP
40.0000 mg | Freq: Once | INTRAMUSCULAR | Status: AC
  Administered 2024-04-21: 40 mg via INTRAMUSCULAR

## 2024-04-21 NOTE — Addendum Note (Signed)
 Addended by: LATTIE CARLO BROCKS on: 04/21/2024 02:51 PM   Modules accepted: Orders

## 2024-04-22 ENCOUNTER — Encounter: Payer: Self-pay | Admitting: Family Medicine

## 2024-04-22 ENCOUNTER — Other Ambulatory Visit (HOSPITAL_BASED_OUTPATIENT_CLINIC_OR_DEPARTMENT_OTHER): Payer: Self-pay

## 2024-04-22 ENCOUNTER — Ambulatory Visit: Admitting: Family Medicine

## 2024-04-22 VITALS — BP 118/70 | HR 67 | Wt 119.8 lb

## 2024-04-22 DIAGNOSIS — M25512 Pain in left shoulder: Secondary | ICD-10-CM

## 2024-04-22 NOTE — Progress Notes (Signed)
" ° °  Name: Bonnie Sanders   Date of Visit: 04/22/24   Date of last visit with me: 03/29/2024   CHIEF COMPLAINT:  Chief Complaint  Patient presents with   Follow-up    4 week follow up on shoulder, doing much better.        HPI:  Discussed the use of AI scribe software for clinical note transcription with the patient, who gave verbal consent to proceed.  History of Present Illness   Bonnie Sanders is a 79 year old female who presents for follow-up of shoulder pain.  She has experienced significant improvement in her shoulder pain, with no pain since the week of Christmas. During that week, she had some pain, but it has resolved completely since then. Previously, the pain was severe enough to make it difficult to use her arm.  She recalls a past episode where a muscle in her shoulder blade caused significant discomfort, prompting her to seek treatment after initially delaying, expecting it to resolve on its own. She received an injection that provided relief and was informed it was treated as arthritis.  She mentions being told by physical therapists that she could not have another injection after the initial one, but she has been reassured that she can receive additional injections every three months if needed.  She reports that she appreciates the use of naloxone for her neck and that it helps her.         OBJECTIVE:       09/23/2023    3:41 PM  Depression screen PHQ 2/9  Decreased Interest 0  Down, Depressed, Hopeless 1  PHQ - 2 Score 1  Altered sleeping 0  Tired, decreased energy 0  Change in appetite 0  Feeling bad or failure about yourself  0  Trouble concentrating 0  Moving slowly or fidgety/restless 0  Suicidal thoughts 0  PHQ-9 Score 1   Difficult doing work/chores Not difficult at all     Data saved with a previous flowsheet row definition     BP Readings from Last 3 Encounters:  04/22/24 118/70  03/29/24 112/70  03/08/24 124/72    BP 118/70   Pulse  67   Wt 119 lb 12.8 oz (54.3 kg)   SpO2 98%   BMI 24.20 kg/m    Physical Exam          Physical Exam Constitutional:      Appearance: Normal appearance.  Neurological:     General: No focal deficit present.     Mental Status: She is alert and oriented to person, place, and time. Mental status is at baseline.     ASSESSMENT/PLAN:   Assessment & Plan Acute pain of left shoulder    Assessment and Plan    Left shoulder pain Pain significantly improved, no current intervention needed unless pain recurs. - Continue strengthening exercises. - Avoid overhead lifting. - Apply ice as needed after prolonged activity. - Return for injection if pain recurs, with at least three months between injections.         Chrishun Scheer A. Vita MD [facility] "

## 2024-04-23 ENCOUNTER — Other Ambulatory Visit (HOSPITAL_BASED_OUTPATIENT_CLINIC_OR_DEPARTMENT_OTHER): Payer: Self-pay

## 2024-04-23 ENCOUNTER — Other Ambulatory Visit: Payer: Self-pay

## 2024-04-28 ENCOUNTER — Other Ambulatory Visit (HOSPITAL_BASED_OUTPATIENT_CLINIC_OR_DEPARTMENT_OTHER): Payer: Self-pay

## 2024-04-29 ENCOUNTER — Other Ambulatory Visit (HOSPITAL_BASED_OUTPATIENT_CLINIC_OR_DEPARTMENT_OTHER): Payer: Self-pay

## 2024-05-06 ENCOUNTER — Other Ambulatory Visit: Payer: Self-pay

## 2024-05-06 ENCOUNTER — Ambulatory Visit (INDEPENDENT_AMBULATORY_CARE_PROVIDER_SITE_OTHER): Payer: Medicare Other | Admitting: Nurse Practitioner

## 2024-05-06 ENCOUNTER — Encounter: Payer: Self-pay | Admitting: Nurse Practitioner

## 2024-05-06 ENCOUNTER — Other Ambulatory Visit (HOSPITAL_BASED_OUTPATIENT_CLINIC_OR_DEPARTMENT_OTHER): Payer: Self-pay

## 2024-05-06 VITALS — BP 112/68 | HR 68 | Ht 59.0 in | Wt 119.6 lb

## 2024-05-06 DIAGNOSIS — K2100 Gastro-esophageal reflux disease with esophagitis, without bleeding: Secondary | ICD-10-CM

## 2024-05-06 DIAGNOSIS — E559 Vitamin D deficiency, unspecified: Secondary | ICD-10-CM

## 2024-05-06 DIAGNOSIS — F959 Tic disorder, unspecified: Secondary | ICD-10-CM

## 2024-05-06 DIAGNOSIS — F5101 Primary insomnia: Secondary | ICD-10-CM

## 2024-05-06 DIAGNOSIS — M810 Age-related osteoporosis without current pathological fracture: Secondary | ICD-10-CM | POA: Diagnosis not present

## 2024-05-06 DIAGNOSIS — I10 Essential (primary) hypertension: Secondary | ICD-10-CM | POA: Diagnosis not present

## 2024-05-06 DIAGNOSIS — Z0000 Encounter for general adult medical examination without abnormal findings: Secondary | ICD-10-CM | POA: Diagnosis not present

## 2024-05-06 DIAGNOSIS — E538 Deficiency of other specified B group vitamins: Secondary | ICD-10-CM

## 2024-05-06 DIAGNOSIS — R7303 Prediabetes: Secondary | ICD-10-CM

## 2024-05-06 DIAGNOSIS — Z23 Encounter for immunization: Secondary | ICD-10-CM

## 2024-05-06 DIAGNOSIS — L578 Other skin changes due to chronic exposure to nonionizing radiation: Secondary | ICD-10-CM

## 2024-05-06 DIAGNOSIS — E785 Hyperlipidemia, unspecified: Secondary | ICD-10-CM | POA: Diagnosis not present

## 2024-05-06 DIAGNOSIS — M25512 Pain in left shoulder: Secondary | ICD-10-CM | POA: Diagnosis not present

## 2024-05-06 DIAGNOSIS — E039 Hypothyroidism, unspecified: Secondary | ICD-10-CM

## 2024-05-06 MED ORDER — MELOXICAM 7.5 MG PO TABS
7.5000 mg | ORAL_TABLET | ORAL | 3 refills | Status: AC | PRN
  Filled 2024-05-06: qty 30, 30d supply, fill #0

## 2024-05-06 MED ORDER — TRETINOIN 0.05 % EX CREA
TOPICAL_CREAM | CUTANEOUS | 1 refills | Status: AC
  Filled 2024-05-06 – 2024-05-07 (×2): qty 45, 90d supply, fill #0

## 2024-05-06 MED ORDER — ROSUVASTATIN CALCIUM 5 MG PO TABS
5.0000 mg | ORAL_TABLET | Freq: Every day | ORAL | 1 refills | Status: AC
  Filled 2024-05-06: qty 90, 90d supply, fill #0

## 2024-05-06 MED ORDER — AMITRIPTYLINE HCL 150 MG PO TABS
150.0000 mg | ORAL_TABLET | Freq: Every day | ORAL | 1 refills | Status: AC
  Filled 2024-05-06: qty 90, 90d supply, fill #0

## 2024-05-06 MED ORDER — OMEPRAZOLE 20 MG PO CPDR
20.0000 mg | DELAYED_RELEASE_CAPSULE | Freq: Every day | ORAL | 1 refills | Status: AC
  Filled 2024-05-06: qty 90, 90d supply, fill #0

## 2024-05-06 MED ORDER — FENOFIBRATE 54 MG PO TABS
54.0000 mg | ORAL_TABLET | Freq: Every day | ORAL | 1 refills | Status: AC
  Filled 2024-05-06: qty 90, 90d supply, fill #0

## 2024-05-06 MED ORDER — LISINOPRIL 10 MG PO TABS
10.0000 mg | ORAL_TABLET | Freq: Every day | ORAL | 1 refills | Status: AC
  Filled 2024-05-06: qty 90, 90d supply, fill #0

## 2024-05-06 MED ORDER — LEVOTHYROXINE SODIUM 75 MCG PO TABS
75.0000 ug | ORAL_TABLET | Freq: Every morning | ORAL | 1 refills | Status: AC
  Filled 2024-05-06: qty 90, 90d supply, fill #0

## 2024-05-06 NOTE — Assessment & Plan Note (Addendum)
 Managed with rosuvastatin . Diet and exercise information provided. No concerns.  Orders:   CBC with Differential/Platelet   CMP14+EGFR   Lipid panel   lisinopril  (ZESTRIL ) 10 MG tablet; Take 1 tablet (10 mg total) by mouth daily.   rosuvastatin  (CRESTOR ) 5 MG tablet; Take 1 tablet (5 mg total) by mouth at bedtime.

## 2024-05-06 NOTE — Assessment & Plan Note (Addendum)
 Doing well with sleep at this time with no concerns. Continue management.  Orders:   amitriptyline  (ELAVIL ) 150 MG tablet; Take 1 tablet (150 mg total) by mouth at bedtime.

## 2024-05-06 NOTE — Assessment & Plan Note (Addendum)
 Continue with current management and skin care routine with sunscreen.  Orders:   tretinoin  (RETIN-A ) 0.05 % cream; Apply a pea sized amount spreading evenly in a thin layer to face at bedtime. Avoid use close to the eyes and mouth. Wash hands thoroughly after applying. Avoid sunlight exposure.

## 2024-05-06 NOTE — Assessment & Plan Note (Addendum)
 BP well controlled at this time with no concerns. Continue current medications. Previous notes, medications, and labs reviewed today. New labs ordered.   Orders:   CBC with Differential/Platelet   CMP14+EGFR   fenofibrate  54 MG tablet; Take 1 tablet (54 mg total) by mouth daily.   lisinopril  (ZESTRIL ) 10 MG tablet; Take 1 tablet (10 mg total) by mouth daily.   rosuvastatin  (CRESTOR ) 5 MG tablet; Take 1 tablet (5 mg total) by mouth at bedtime.

## 2024-05-06 NOTE — Assessment & Plan Note (Addendum)
 Repeat labs today for monitoring. Previous Ca elevation, will check PTH.  Orders:   Parathyroid hormone, intact (no Ca)   TSH   T4, free   VITAMIN D  25 Hydroxy (Vit-D Deficiency, Fractures)

## 2024-05-06 NOTE — Assessment & Plan Note (Addendum)
 Will monitor labs today to make sure that these levels are good. Will contact patient with any changes that are needed.  Orders:   Vitamin B12

## 2024-05-06 NOTE — Progress Notes (Signed)
 "  Bonnie Doing, DNP, AGNP-c [facility] [address] [place], [place] [address] Main Office [phone] VISIT TYPE: CPE on 05/06/2024 Today's Vitals   05/06/24 0820  BP: 112/68  Pulse: 68  Weight: 119 lb 9.6 oz (54.3 kg)  Height: 4' 11 (1.499 m)   Body mass index is 24.16 kg/m.  Wt Readings from Last 3 Encounters:  05/06/24 119 lb 9.6 oz (54.3 kg)  04/22/24 119 lb 12.8 oz (54.3 kg)  03/29/24 122 lb 6.4 oz (55.5 kg)     Subjective:  Annual Exam (CPE, fasting labs, ) COVID vaccine 01/11/24 in medication list- Cominraty Autonation) Twinrix vaccine 01/11/2024 in medication list (Hep A and HepB) Fluzone  High Dose vaccine 12/04/2023 in medication list (August)   History of Present Illness Bonnie Sanders is a 79 year old female who presents for her annual physical exam.  She feels good overall and has received her COVID vaccine and hepatitis A and B vaccines in October, and her flu vaccine in August. She experienced feeling 'spaced out' after receiving the COVID and influenza vaccines together the previous year, so she opted to receive them separately this time.  She has a history of rotator cuff issues and received a joint injection which improved her symptoms. She uses Miralax to manage her bowel habits, which are typically soft and sometimes difficult to evacuate. She drinks plenty of water and has adjusted her Miralax dosage to find a balance that works for her.  She takes amitriptyline  at bedtime and occasionally uses meloxicam  for neck pain, which she finds effective. She prefers Tums over Pepcid  for indigestion, noting that Tylenol seems to cause indigestion for her.  She has hearing aids and reports improved hearing, although her husband is reluctant to get them despite missing some sounds. She had a negative experience with a previous provider but is satisfied with her current service, which includes home visits.  She reports no chest pain or shortness of  breath, and only seldom experiences swelling in her feet. Pertinent items are noted in HPI.     05/06/2024    8:20 AM 09/23/2023    3:41 PM 04/29/2023    9:55 AM 09/17/2022    3:35 PM 08/19/2022    8:08 AM  Depression screen PHQ 2/9  Decreased Interest 0 0 0 0 0  Down, Depressed, Hopeless 0 1 0 0 0  PHQ - 2 Score 0 1 0 0 0  Altered sleeping  0     Tired, decreased energy  0     Change in appetite  0     Feeling bad or failure about yourself   0     Trouble concentrating  0     Moving slowly or fidgety/restless  0     Suicidal thoughts  0     PHQ-9 Score  1      Difficult Sanders work/chores  Not difficult at all        Data saved with a previous flowsheet row definition       02/18/2022    8:34 AM 06/06/2021    8:07 AM  GAD 7 : Generalized Anxiety Score  Nervous, Anxious, on Edge 0  0   Control/stop worrying 1  0   Worry too much - different things 0  0   Trouble relaxing 0  0   Restless 0  0   Easily annoyed or irritable 0  0   Afraid - awful might happen 0  0  Total GAD 7 Score 1 0  Anxiety Difficulty  Not difficult at all     Data saved with a previous flowsheet row definition       05/06/2024    8:19 AM 09/23/2023    3:39 PM 04/29/2023    9:54 AM 09/17/2022    3:33 PM 08/19/2022    8:07 AM  Fall Risk   Falls in the past year? 0 1 1 1  0  Comment  tripped walking  tripped   Number falls in past yr: 0 0 0 0 0  Injury with Fall? 0 0  0  0  0   Risk for fall due to : No Fall Risks Medication side effect No Fall Risks Medication side effect No Fall Risks  Follow up Falls evaluation completed Falls evaluation completed;Falls prevention discussed Falls evaluation completed Falls prevention discussed;Education provided;Falls evaluation completed Falls evaluation completed     Data saved with a previous flowsheet row definition   Past medical history, surgical history, medications, allergies, family history and social history reviewed with patient today and changes made to  appropriate areas of the chart.      Objective:    Physical Exam Vitals and nursing note reviewed.  Constitutional:      General: She is not in acute distress.    Appearance: Normal appearance.  HENT:     Head: Normocephalic and atraumatic.     Right Ear: Hearing, tympanic membrane, ear canal and external ear normal.     Left Ear: Hearing, tympanic membrane, ear canal and external ear normal.     Nose: Nose normal.     Right Sinus: No maxillary sinus tenderness or frontal sinus tenderness.     Left Sinus: No maxillary sinus tenderness or frontal sinus tenderness.     Mouth/Throat:     Lips: Pink.     Mouth: Mucous membranes are moist.     Pharynx: Oropharynx is clear.  Eyes:     General: Lids are normal. Vision grossly intact.     Extraocular Movements: Extraocular movements intact.     Conjunctiva/sclera: Conjunctivae normal.     Pupils: Pupils are equal, round, and reactive to light.     Funduscopic exam:    Right eye: Red reflex present.        Left eye: Red reflex present.    Visual Fields: Right eye visual fields normal and left eye visual fields normal.  Neck:     Thyroid : No thyromegaly.     Vascular: No carotid bruit.  Cardiovascular:     Rate and Rhythm: Normal rate and regular rhythm.     Chest Wall: PMI is not displaced.     Pulses: Normal pulses.          Dorsalis pedis pulses are 2+ on the right side and 2+ on the left side.       Posterior tibial pulses are 2+ on the right side and 2+ on the left side.     Heart sounds: Normal heart sounds. No murmur heard. Pulmonary:     Effort: Pulmonary effort is normal. No respiratory distress.     Breath sounds: Normal breath sounds.  Abdominal:     General: Abdomen is flat. Bowel sounds are normal. There is no distension.     Palpations: Abdomen is soft. There is no hepatomegaly, splenomegaly or mass.     Tenderness: There is no abdominal tenderness. There is no right CVA tenderness, left CVA tenderness, guarding or  rebound.  Musculoskeletal:        General: Normal range of motion.     Cervical back: Full passive range of motion without pain, normal range of motion and neck supple. No tenderness.     Right lower leg: No edema.     Left lower leg: No edema.  Feet:     Left foot:     Toenail Condition: Left toenails are normal.  Lymphadenopathy:     Cervical: No cervical adenopathy.     Upper Body:     Right upper body: No supraclavicular adenopathy.     Left upper body: No supraclavicular adenopathy.  Skin:    General: Skin is warm and dry.     Capillary Refill: Capillary refill takes less than 2 seconds.     Nails: There is no clubbing.  Neurological:     General: No focal deficit present.     Mental Status: She is alert and oriented to person, place, and time.     GCS: GCS eye subscore is 4. GCS verbal subscore is 5. GCS motor subscore is 6.     Sensory: Sensation is intact.     Motor: Motor function is intact.     Coordination: Coordination is intact.     Gait: Gait is intact.     Deep Tendon Reflexes: Reflexes are normal and symmetric.  Psychiatric:        Attention and Perception: Attention normal.        Mood and Affect: Mood normal.        Speech: Speech normal.        Behavior: Behavior normal. Behavior is cooperative.        Thought Content: Thought content normal.        Cognition and Memory: Cognition and memory normal.        Judgment: Judgment normal.         Assessment & Plan:   Assessment & Plan Encounter for annual physical exam CPE completed today. Review of HM activities and recommendations discussed and provided on AVS. Anticipatory guidance, diet, and exercise recommendations provided. Medications, allergies, and hx reviewed and updated as necessary. Orders placed as listed below.  Previous notes, medications, and labs reviewed today. New labs ordered.   Plan: - Labs ordered. Will make changes as necessary based on results.  - I will review these results and  send recommendations via MyChart or a telephone call.  - F/U with CPE in 1 year or sooner for acute/chronic health needs as directed.      B12 deficiency Will monitor labs today to make sure that these levels are good. Will contact patient with any changes that are needed.  Orders:   Vitamin B12  Age-related osteoporosis without current pathological fracture Repeat labs today for monitoring. Previous Ca elevation, will check PTH.  Orders:   Parathyroid hormone, intact (no Ca)   TSH   T4, free   VITAMIN D  25 Hydroxy (Vit-D Deficiency, Fractures)  Primary hypertension BP well controlled at this time with no concerns. Continue current medications. Previous notes, medications, and labs reviewed today. New labs ordered.   Orders:   CBC with Differential/Platelet   CMP14+EGFR   fenofibrate  54 MG tablet; Take 1 tablet (54 mg total) by mouth daily.   lisinopril  (ZESTRIL ) 10 MG tablet; Take 1 tablet (10 mg total) by mouth daily.   rosuvastatin  (CRESTOR ) 5 MG tablet; Take 1 tablet (5 mg total) by mouth at bedtime.  Acquired hypothyroidism No current thyroid  issues reported.  Will recheck labs today.  - Continue current thyroid  medication regimen. Orders:   CBC with Differential/Platelet   CMP14+EGFR   TSH   T4, free   levothyroxine  (SYNTHROID ) 75 MCG tablet; Take 1 tablet (75 mcg total) by mouth every morning.  Pre-diabetes Recheck A1c. Diet and exercise in place.  Orders:   Hemoglobin A1c  Vitamin D  deficiency Chronic in the setting of osteoporosis and hypothyroidism in post menopausal female. Monitoring for control and management.  Orders:   VITAMIN D  25 Hydroxy (Vit-D Deficiency, Fractures)  Tic disorder, unspecified Well controlled with current management. Refills provided.  Orders:   amitriptyline  (ELAVIL ) 150 MG tablet; Take 1 tablet (150 mg total) by mouth at bedtime.  Primary insomnia Sanders well with sleep at this time with no concerns. Continue management.   Orders:   amitriptyline  (ELAVIL ) 150 MG tablet; Take 1 tablet (150 mg total) by mouth at bedtime.  Hyperlipidemia with target low density lipoprotein (LDL) cholesterol less than 130 mg/dL Managed with rosuvastatin . Diet and exercise information provided. No concerns.  Orders:   CBC with Differential/Platelet   CMP14+EGFR   Lipid panel   lisinopril  (ZESTRIL ) 10 MG tablet; Take 1 tablet (10 mg total) by mouth daily.   rosuvastatin  (CRESTOR ) 5 MG tablet; Take 1 tablet (5 mg total) by mouth at bedtime.  Gastroesophageal reflux disease with esophagitis, unspecified whether hemorrhage Managed with omeprazole  and Sanders well. Encouraged daily use if using meloxicam .  Orders:   omeprazole  (PRILOSEC) 20 MG capsule; Take 1 capsule (20 mg total) by mouth daily.  Sun-damaged skin Continue with current management and skin care routine with sunscreen.  Orders:   tretinoin  (RETIN-A ) 0.05 % cream; Apply a pea sized amount spreading evenly in a thin layer to face at bedtime. Avoid use close to the eyes and mouth. Wash hands thoroughly after applying. Avoid sunlight exposure.  Acute pain of left shoulder PRN meloxicam  for shoulder injury. Discussed the importance of GI protection while taking this medication. Omeprazole  refilled.  Orders:   meloxicam  (MOBIC ) 7.5 MG tablet; Take 1 tablet (7.5 mg total) by mouth as needed for pain. Take no more than once daily.  Vaccine for viral hepatitis Recent second Hep A and Hep B vaccine provided. Patient unsure if three doses have been received. Will monitor antibody level to determine if additional doses are required.  Orders:   HAV, HBV Immunity  NEXT PREVENTATIVE PHYSICAL DUE IN 1 YEAR.    PATIENT COUNSELING PROVIDED FOR ALL ADULT PATIENTS: A well balanced diet low in saturated fats, cholesterol, and moderation in carbohydrates.  This can be as simple as monitoring portion sizes and cutting back on sugary beverages such as soda and juice to start with.     Daily water consumption of at least 64 ounces.  Physical activity at least 180 minutes per week.  If just starting out, start 10 minutes a day and work your way up.   This can be as simple as taking the stairs instead of the elevator and walking 2-3 laps around the office  purposefully every day.   STD protection, partner selection, and regular testing if high risk.  Limited consumption of alcoholic beverages if alcohol  is consumed. For men, I recommend no more than 14 alcoholic beverages per week, spread out throughout the week (max 2 per day). Avoid binge drinking or consuming large quantities of alcohol  in one setting.  Please let me know if you feel you may need help with reduction or quitting alcohol  consumption.  Avoidance of nicotine, if used. Please let me know if you feel you may need help with reduction or quitting nicotine use.   Daily mental health attention. This can be in the form of 5 minute daily meditation, prayer, journaling, yoga, reflection, etc.  Purposeful attention to your emotions and mental state can significantly improve your overall wellbeing  and Health.  Please know that I am here to help you with all of your health care goals and am happy to work with you to find a solution that works best for you.  The greatest advice I have received with any changes in life are to take it one step at a time, that even means if all you can focus on is the next 60 seconds, then do that and celebrate your victories.  With any changes in life, you will have set backs, and that is OK. The important thing to remember is, if you have a set back, it is not a failure, it is an opportunity to try again! Screening Testing Mammogram Every 1 -2 years based on history and risk factors Starting at age 89 Pap Smear Ages 21-39 every 3 years Ages 69-65 every 5 years with HPV testing More frequent testing may be required based on results and history Colon Cancer Screening Every  1-10 years based on test performed, risk factors, and history Starting at age 39 Bone Density Screening Every 2-10 years based on history Starting at age 54 for women Recommendations for men differ based on medication usage, history, and risk factors AAA Screening One time ultrasound Men 6-16 years old who have every smoked Lung Cancer Screening Low Dose Lung CT every 12 months Age 7-80 years with a 30 pack-year smoking history who still smoke or who have quit within the last 15 years   Screening Labs Routine  Labs: Complete Blood Count (CBC), Complete Metabolic Panel (CMP), Cholesterol (Lipid Panel) Every 6-12 months based on history and medications May be recommended more frequently based on current conditions or previous results Hemoglobin A1c Lab Every 3-12 months based on history and previous results Starting at age 80 or earlier with diagnosis of diabetes, high cholesterol, BMI >26, and/or risk factors Frequent monitoring for patients with diabetes to ensure blood sugar control Thyroid  Panel (TSH) Every 6 months based on history, symptoms, and risk factors May be repeated more often if on medication HIV One time testing for all patients 29 and older May be repeated more frequently for patients with increased risk factors or exposure Hepatitis C One time testing for all patients 1 and older May be repeated more frequently for patients with increased risk factors or exposure Gonorrhea, Chlamydia Every 12 months for all sexually active persons 13-24 years Additional monitoring may be recommended for those who are considered high risk or who have symptoms Every 12 months for any woman on birth control, regardless of sexual activity PSA Men 31-87 years old with risk factors Additional screening may be recommended from age 21-69 based on risk factors, symptoms, and history  Vaccine Recommendations Tetanus Booster All adults every 10 years Flu Vaccine All patients 6  months and older every year COVID Vaccine All patients 12 years and older Initial dosing with booster May recommend additional booster based on age and health history HPV Vaccine 2 doses all patients age 30-26 Dosing may be considered for patients over 26 Shingles Vaccine (Shingrix) 2 doses all adults 55 years and older Pneumonia (Pneumovax 23) All adults 65 years and older May recommend  earlier dosing based on health history One year apart from Prevnar 13 Pneumonia (Prevnar 91) All adults 65 years and older Dosed 1 year after Pneumovax 23 Pneumonia (Prevnar 20) One time alternative to the two dosing of 13 and 23 For all adults with initial dose of 23, 20 is recommended 1 year later For all adults with initial dose of 13, 23 is still recommended as second option 1 year later        "

## 2024-05-06 NOTE — Assessment & Plan Note (Addendum)
 No current thyroid  issues reported. Will recheck labs today.  - Continue current thyroid  medication regimen. Orders:   CBC with Differential/Platelet   CMP14+EGFR   TSH   T4, free   levothyroxine  (SYNTHROID ) 75 MCG tablet; Take 1 tablet (75 mcg total) by mouth every morning.

## 2024-05-06 NOTE — Assessment & Plan Note (Addendum)
 Managed with omeprazole  and doing well. Encouraged daily use if using meloxicam .  Orders:   omeprazole  (PRILOSEC) 20 MG capsule; Take 1 capsule (20 mg total) by mouth daily.

## 2024-05-06 NOTE — Assessment & Plan Note (Addendum)
 Well controlled with current management. Refills provided.  Orders:   amitriptyline  (ELAVIL ) 150 MG tablet; Take 1 tablet (150 mg total) by mouth at bedtime.

## 2024-05-06 NOTE — Assessment & Plan Note (Addendum)
 CPE completed today. Review of HM activities and recommendations discussed and provided on AVS. Anticipatory guidance, diet, and exercise recommendations provided. Medications, allergies, and hx reviewed and updated as necessary. Orders placed as listed below.  Previous notes, medications, and labs reviewed today. New labs ordered.   Plan: - Labs ordered. Will make changes as necessary based on results.  - I will review these results and send recommendations via MyChart or a telephone call.  - F/U with CPE in 1 year or sooner for acute/chronic health needs as directed.

## 2024-05-06 NOTE — Progress Notes (Signed)
 error

## 2024-05-06 NOTE — Patient Instructions (Addendum)
 VISIT SUMMARY: It was SO GOOD to see you today! You look fabulous!! I don't see any concerns at this time.   During your annual physical exam, you reported feeling good overall and discussed your recent vaccinations, medication use, and management of various health issues. You also shared your experience with hearing aids and your husband's reluctance to use them.  YOUR PLAN:  -ACQUIRED HYPOTHYROIDISM: Acquired hypothyroidism is a condition where your thyroid  gland does not produce enough thyroid  hormone. You reported no current thyroid  issues, so you should continue your current thyroid  medication regimen. If we need to change the dose based on the labs, I will let you know.   -GASTROESOPHAGEAL REFLUX DISEASE WITH ESOPHAGITIS: Gastroesophageal reflux disease (GERD) with esophagitis is a condition where stomach acid frequently flows back into the tube connecting your mouth and stomach, causing irritation. You should continue managing your indigestion with Tums as needed. Omeprazole  is a daily medication to help with this. If you take the Meloxicam  you may need to take famotidine  with this to help, because this can sometimes make the GERD worse.   -ACUTE PAIN OF LEFT SHOULDER: Acute pain of the left shoulder is being managed with meloxicam , and previous joint injections have been beneficial. You should continue using meloxicam  as needed for pain management and follow up with Dr. Vita if you need more joint injections. I sent in refills of the Meloxicam  for you. Try not to take this every day to prevent stomach upset.   -VITAMIN D  DEFICIENCY: Vitamin D  deficiency means you have lower than normal levels of vitamin D , which is important for bone health. Your vitamin D  levels will be checked with your current labs. I will let you know if we need to make changes.   -GENERAL HEALTH MAINTENANCE: You have received your COVID, hepatitis A and B, and flu vaccines. We discussed the transmission of hepatitis A and  B and the importance of vaccination. Your vaccination records have been updated, and we checked your hepatitis A and B antibodies to assess immunity today. If you need an additional injection I will let you know.   INSTRUCTIONS:  Please continue your current medication regimen and follow the management plans discussed for each condition. If you experience any new symptoms or have concerns, schedule a follow-up appointment. Additionally, follow up with an orthopedic specialist as needed for joint injections.

## 2024-05-07 ENCOUNTER — Other Ambulatory Visit (HOSPITAL_BASED_OUTPATIENT_CLINIC_OR_DEPARTMENT_OTHER): Payer: Self-pay

## 2024-05-07 ENCOUNTER — Ambulatory Visit: Payer: Self-pay | Admitting: Nurse Practitioner

## 2024-05-07 LAB — CBC WITH DIFFERENTIAL/PLATELET
Basophils Absolute: 0.1 10*3/uL (ref 0.0–0.2)
Basos: 1 %
EOS (ABSOLUTE): 0.1 10*3/uL (ref 0.0–0.4)
Eos: 2 %
Hematocrit: 34.7 % (ref 34.0–46.6)
Hemoglobin: 11.8 g/dL (ref 11.1–15.9)
Immature Grans (Abs): 0 10*3/uL (ref 0.0–0.1)
Immature Granulocytes: 0 %
Lymphocytes Absolute: 1.5 10*3/uL (ref 0.7–3.1)
Lymphs: 27 %
MCH: 30.4 pg (ref 26.6–33.0)
MCHC: 34 g/dL (ref 31.5–35.7)
MCV: 89 fL (ref 79–97)
Monocytes Absolute: 0.4 10*3/uL (ref 0.1–0.9)
Monocytes: 7 %
Neutrophils Absolute: 3.4 10*3/uL (ref 1.4–7.0)
Neutrophils: 63 %
Platelets: 262 10*3/uL (ref 150–450)
RBC: 3.88 x10E6/uL (ref 3.77–5.28)
RDW: 13.1 % (ref 11.7–15.4)
WBC: 5.4 10*3/uL (ref 3.4–10.8)

## 2024-05-07 LAB — CMP14+EGFR
ALT: 15 [IU]/L (ref 0–32)
AST: 23 [IU]/L (ref 0–40)
Albumin: 4.8 g/dL (ref 3.8–4.8)
Alkaline Phosphatase: 53 [IU]/L (ref 49–135)
BUN/Creatinine Ratio: 24 (ref 12–28)
BUN: 21 mg/dL (ref 8–27)
Bilirubin Total: 0.3 mg/dL (ref 0.0–1.2)
CO2: 22 mmol/L (ref 20–29)
Calcium: 10.7 mg/dL — ABNORMAL HIGH (ref 8.7–10.3)
Chloride: 103 mmol/L (ref 96–106)
Creatinine, Ser: 0.87 mg/dL (ref 0.57–1.00)
Globulin, Total: 2.1 g/dL (ref 1.5–4.5)
Glucose: 90 mg/dL (ref 70–99)
Potassium: 4.2 mmol/L (ref 3.5–5.2)
Sodium: 141 mmol/L (ref 134–144)
Total Protein: 6.9 g/dL (ref 6.0–8.5)
eGFR: 68 mL/min/{1.73_m2}

## 2024-05-07 LAB — LIPID PANEL
Chol/HDL Ratio: 3.2 ratio (ref 0.0–4.4)
Cholesterol, Total: 194 mg/dL (ref 100–199)
HDL: 61 mg/dL
LDL Chol Calc (NIH): 111 mg/dL — ABNORMAL HIGH (ref 0–99)
Triglycerides: 122 mg/dL (ref 0–149)
VLDL Cholesterol Cal: 22 mg/dL (ref 5–40)

## 2024-05-07 LAB — T4, FREE: Free T4: 1.48 ng/dL (ref 0.82–1.77)

## 2024-05-07 LAB — VITAMIN B12: Vitamin B-12: 371 pg/mL (ref 232–1245)

## 2024-05-07 LAB — HEMOGLOBIN A1C
Est. average glucose Bld gHb Est-mCnc: 128 mg/dL
Hgb A1c MFr Bld: 6.1 % — ABNORMAL HIGH (ref 4.8–5.6)

## 2024-05-07 LAB — HAV, HBV IMMUNITY
Hep B Core Total Ab: NEGATIVE
Hep B Surface Ab, Qual: REACTIVE
hep A Total Ab: POSITIVE — AB

## 2024-05-07 LAB — HAVIGM: Hep A IgM: NEGATIVE

## 2024-05-07 LAB — VITAMIN D 25 HYDROXY (VIT D DEFICIENCY, FRACTURES): Vit D, 25-Hydroxy: 80.2 ng/mL (ref 30.0–100.0)

## 2024-05-07 LAB — TSH: TSH: 3.71 u[IU]/mL (ref 0.450–4.500)

## 2024-05-07 LAB — PARATHYROID HORMONE, INTACT (NO CA): PTH: 18 pg/mL (ref 15–65)

## 2024-05-14 ENCOUNTER — Ambulatory Visit: Admission: RE | Admit: 2024-05-14 | Source: Ambulatory Visit

## 2024-05-14 DIAGNOSIS — Z1231 Encounter for screening mammogram for malignant neoplasm of breast: Secondary | ICD-10-CM

## 2024-09-28 ENCOUNTER — Ambulatory Visit: Payer: Self-pay

## 2024-11-04 ENCOUNTER — Ambulatory Visit: Admitting: Nurse Practitioner

## 2025-05-17 ENCOUNTER — Encounter: Admitting: Nurse Practitioner
